# Patient Record
Sex: Male | Born: 1970
Health system: Southern US, Community
[De-identification: ages and names within clinical notes are randomized; demographics above are authoritative.]

## PROBLEM LIST (undated history)

## (undated) DIAGNOSIS — H538 Other visual disturbances: Secondary | ICD-10-CM

## (undated) DIAGNOSIS — K219 Gastro-esophageal reflux disease without esophagitis: Secondary | ICD-10-CM

## (undated) DIAGNOSIS — I10 Essential (primary) hypertension: Secondary | ICD-10-CM

## (undated) DIAGNOSIS — G43009 Migraine without aura, not intractable, without status migrainosus: Secondary | ICD-10-CM

## (undated) DIAGNOSIS — F32A Depression, unspecified: Secondary | ICD-10-CM

## (undated) DIAGNOSIS — R519 Headache, unspecified: Secondary | ICD-10-CM

## (undated) DIAGNOSIS — R509 Fever, unspecified: Secondary | ICD-10-CM

## (undated) DIAGNOSIS — F259 Schizoaffective disorder, unspecified: Secondary | ICD-10-CM

## (undated) DIAGNOSIS — I639 Cerebral infarction, unspecified: Secondary | ICD-10-CM

## (undated) DIAGNOSIS — F329 Major depressive disorder, single episode, unspecified: Secondary | ICD-10-CM

## (undated) DIAGNOSIS — R51 Headache: Secondary | ICD-10-CM

## (undated) DIAGNOSIS — F23 Brief psychotic disorder: Secondary | ICD-10-CM

## (undated) DIAGNOSIS — J449 Chronic obstructive pulmonary disease, unspecified: Secondary | ICD-10-CM

## (undated) DIAGNOSIS — F319 Bipolar disorder, unspecified: Secondary | ICD-10-CM

## (undated) DIAGNOSIS — R9431 Abnormal electrocardiogram [ECG] [EKG]: Secondary | ICD-10-CM

## (undated) HISTORY — DX: Chronic obstructive pulmonary disease, unspecified: J44.9

## (undated) HISTORY — DX: Cerebral infarction, unspecified: I63.9

## (undated) HISTORY — DX: Fever, unspecified: R50.9

## (undated) HISTORY — DX: Other visual disturbances: H53.8

## (undated) HISTORY — DX: Depression, unspecified: F32.A

## (undated) HISTORY — DX: Abnormal electrocardiogram (ECG) (EKG): R94.31

## (undated) HISTORY — DX: Essential (primary) hypertension: I10

## (undated) HISTORY — DX: Headache: R51

## (undated) HISTORY — DX: Headache, unspecified: R51.9

## (undated) HISTORY — PX: CYST REMOVAL HAND: SHX6279

## (undated) HISTORY — DX: Brief psychotic disorder: F23

## (undated) HISTORY — DX: Migraine without aura, not intractable, without status migrainosus: G43.009

## (undated) HISTORY — DX: Gastro-esophageal reflux disease without esophagitis: K21.9

---
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
## 2002-03-08 ENCOUNTER — Inpatient Hospital Stay (HOSPITAL_COMMUNITY): Admission: EM | Admit: 2002-03-08 | Discharge: 2002-03-12 | Payer: Self-pay | Admitting: Psychiatry

## 2007-07-26 ENCOUNTER — Ambulatory Visit: Payer: Self-pay | Admitting: *Deleted

## 2007-07-26 ENCOUNTER — Inpatient Hospital Stay (HOSPITAL_COMMUNITY): Admission: AD | Admit: 2007-07-26 | Discharge: 2007-07-30 | Payer: Self-pay | Admitting: *Deleted

## 2009-09-30 ENCOUNTER — Ambulatory Visit: Payer: Self-pay | Admitting: Psychiatry

## 2009-09-30 ENCOUNTER — Inpatient Hospital Stay (HOSPITAL_COMMUNITY): Admission: RE | Admit: 2009-09-30 | Discharge: 2009-10-02 | Payer: Self-pay | Admitting: Psychiatry

## 2010-03-18 LAB — COMPREHENSIVE METABOLIC PANEL
ALT: 100 U/L — ABNORMAL HIGH (ref 0–53)
AST: 57 U/L — ABNORMAL HIGH (ref 0–37)
Albumin: 3.5 g/dL (ref 3.5–5.2)
Alkaline Phosphatase: 81 U/L (ref 39–117)
BUN: 10 mg/dL (ref 6–23)
CO2: 26 mEq/L (ref 19–32)
Calcium: 8.7 mg/dL (ref 8.4–10.5)
Chloride: 106 mEq/L (ref 96–112)
Creatinine, Ser: 1.01 mg/dL (ref 0.4–1.5)
GFR calc Af Amer: >60 mL/min (ref >60)
GFR calc non Af Amer: >60 mL/min (ref >60)
Glucose, Bld: 94 mg/dL (ref 70–99)
Potassium: 3.9 mEq/L (ref 3.5–5.1)
Sodium: 139 mEq/L (ref 135–145)
Total Bilirubin: 0.5 mg/dL (ref 0.3–1.2)
Total Protein: 6.6 g/dL (ref 6.0–8.3)

## 2010-03-18 LAB — CBC
HCT: 40 % (ref 39.0–52.0)
Hemoglobin: 13.8 g/dL (ref 13.0–17.0)
MCH: 31.2 pg (ref 26.0–34.0)
MCHC: 34.6 g/dL (ref 30.0–36.0)
MCV: 90.2 fL (ref 78.0–100.0)
Platelets: 196 10*3/uL (ref 150–400)
RBC: 4.44 MIL/uL (ref 4.22–5.81)
RDW: 12.6 % (ref 11.5–15.5)
WBC: 9.2 10*3/uL (ref 4.0–10.5)
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
## 2010-05-21 NOTE — H&P (Signed)
Cameron Bryan, Cameron Bryan NO.:  [id number]   MEDICAL RECORD NO.:  [id number]                   PATIENT TYPE:  IPS   LOCATION:  0599                                 FACILITY:  [facility]   PHYSICIAN:  Geoffery Lyons, M.D.                   DATE OF BIRTH:  June 16, 1970   DATE OF ADMISSION:  03/08/2002  DATE OF DISCHARGE:                         PSYCHIATRIC ADMISSION ASSESSMENT   IDENTIFYING INFORMATION:  This is a 40 year old white married male, who was  admitted voluntarily.  Source of information is the patient and accompanying  records.   REASON FOR ADMISSION AND SYMPTOMS:  The patient says he wants to quit  drinking, I'm tired of it.  He has drank almost daily since age 78.  He is  not sure when chest/abdominal pain started.  He described a burn in the  substernal area and also a stabbing pain in his back.  He says, if he quit  cigarettes for a few days, it went away.  He does endorse depression.  He  states he does not sleep much.  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 at  night.  He reports mood swings where he becomes mean and irritable.  Apparently, his wife reports that he blacks out after drinking.  His wife  reports that he repeats himself, that he asks a lot of questions.   He was seen at the [facility] ER yesterday.  Apparently, he was given  lorazepam 2 mg at 6 p.m. #20 and, when he presented last night, he was down  to 11 or 12.  So there was also some concern that he might be abusing the  lorazepam.  Apparently, this was the first time he has been prescribed this  medication.   PAST PSYCHIATRIC HISTORY:  The patient denies.  He says his wife is bipolar  and takes medications.  He states that, in 17-May-1994, after his pap-paw died, he  began drinking heavily.  He has had three DUIs.  However, prior to this,  he  has had no formal psychiatric treatment.   SOCIAL HISTORY:  He does have a GED.  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  This is his second marriage.  He is into it for four  years.  His first marriage lasted about two years.  He has a daughter, age  7, a son, age 44, and a 32-year-old son today.  He also has a 88-year-old  stepson.   FAMILY HISTORY:  He says there is alcohol all over the place.  He does have  an uncle who killed someone by accident who has been sober for three years.   ALCOHOL/DRUG HISTORY:  He acknowledges alcohol  only, usually beer, 12 12-  ounce bottles a day.  He also acknowledges about 18 cigarettes a day for 17  years and he has chewed tobacco since he was age 40.   MEDICAL HISTORY AND PRIMARY CARE Nikaya Nasby:  His actual primary care [facility]  is Dr. Rutha Bouchard, although he has not seen Dr. Rutha Bouchard of late.  He was seen in  the occupational health department at [facility] by Dr. Lenise Arena, who  reinstated Ziac 5 mg p.o. q.d. for his hypertension.   ALLERGIES:  He has no known drug allergies.   REVIEW OF SYSTEMS:  Substernal burning chest pain that is unpredictable.  He  also reports stabbing pain between his shoulder blades at times.  He states  that, if he stops smoking for a couple of days, these symptoms subside.  He  has never had any treatment for reflux.  Beyond that, his review of systems  was negative.   PHYSICAL EXAMINATION:  GENERAL:  Well-developed, well-nourished, white male  in no acute distress.  VITAL SIGNS:  He is 5 feet 8 inches.  He weighs 170 pounds.  His blood  pressure was 141/87, 153/91, pulse 87, respirations 20 and temperature 98.7.  SKIN:  Intact with no remarkable findings.  HEENT:  Normocephalic.  PERRLA.  Thyroid within the midline.  Lymphadenopathy, no enlarged lymph nodes.  CHEST:  Clear to auscultation and percussion.  HEART:  Regular rate and rhythm without murmurs, rubs or gallops.  ABDOMEN:  Soft with no  organomegaly.  Bowel sounds are present in all four  quadrants.  MUSCULOSKELETAL:  No clubbing, cyanosis or edema.  No decreased range of  motion.  DTRs were 2+ and equal bilaterally.  NEUROLOGICAL:  Cranial nerves 2-12 are grossly intact.  Specifically, he had  no tremor from withdrawal.   MENTAL STATUS EXAM:  He is somewhat depressed and anxious.  He is neatly  dressed and groomed.  He is cooperative.  His speech had a normal rate,  rhythm and he had no pressured speech.  He denied suicidal or homicidal  ideation.  He denied auditory or visual hallucinations.  He does report mood  changes where he becomes quite irritable.  He is alert and oriented x 3.  Judgment and insight are fair.  He reports he has trouble with recent  memory, that his concentration was intact and his abstracting was concrete.  A mood disorder questionnaire was administered, which was grossly positive.  We will consider a diagnosis of bipolar disorder, type 2, based on this.   LABORATORY DATA:  Blood count showed hemoglobin of 14.7, hematocrit of 41.9,  white blood cell count 10.1 and platelets 199,000.  He had no abnormalities  on his chemistries, specifically his total bilirubin of 0.6, his alkaline  phosphatase was 69, SGOT 28, SGPT 40.  The remainder of his labs are  pending, specifically his TSH.   DIAGNOSES:   AXIS I:  1. Alcohol dependence.  2. Depressive disorder not otherwise specified.  3. Rule out bipolar disorder, type 2.   AXIS II:  Deferred.   AXIS III:  1. Hypertension.  2. Nicotine dependence.   AXIS IV:  Problems related to legal system.   AXIS V:  He is currently 79; in the past year 80-85.   PLAN:  Admit and help him through withdrawal.  Help him maintain sobriety so  he can regain his driver's license.  He is eligible to regain it in August.  Assess for reflux disease and treat  as indicated.     Mickie Adams- Deery, P.A.-C.              Geoffery Lyons, M.D.    MA/MEDQ  D:   03/09/2002  T:  03/09/2002  Job:  [id number]

## 2010-05-21 NOTE — Discharge Summary (Signed)
Cameron Bryan, Cameron Bryan NO.:  [id number]   MEDICAL RECORD NO.:  [id number]                   PATIENT TYPE:  IPS   LOCATION:  0502                                 [facility]ACILITY:  [facility]   PHYSICIAN:  Geoffery Lyons, M.D.                   DATE OF BIRTH:  [birth year]   DATE OF ADMISSION:  03/08/2002  DATE OF DISCHARGE:  03/12/2002                                 DISCHARGE SUMMARY   CHIEF COMPLAINT AND PRESENTING ILLNESS:  This was the first admission  to  [facility]  for this 40 year old white married male,  admitted voluntarily.  He claimed he wanted to quit drinking I'm tired of  it, drank almost daily since age 68, not sure when chest and abdominal  pains started.  Described a burn in the substernal area and also stabbing  pack in his back.  He quit cigarettes for a few days and it went away.  Endorsed depression, has not slept much.  He begins drinking after he comes  home from his job at [employer].  Drinks 12 12-ounce bottles of beer  until 9 p.m. at night.  Reported mood swings, becomes mean, irritable.  Apparently he blacks out after drinking.   PAST PSYCHIATRIC HISTORY:  He reports no psychiatric treatment.  Three DUIs.   FAMILY HISTORY:  Family history of alcohol dependency.   ALCOHOL AND DRUG HISTORY:  Acknowledges alcohol 12 12-ounce bottles of beer  a day.   PAST MEDICAL HISTORY:  Arterial hypertension.   MEDICATIONS:  Ziap 5 mg every day.   PHYSICAL EXAMINATION:  Performed, failed to show any acute findings.   MENTAL STATUS EXAM:  Reveals a somewhat depressed and anxious, neatly  dressed and groomed, cooperative.  Speech was normal rate, rhythm.  No  pressured speech.  Denies suicidal or homicidal ideas.  Denies auditory or  visual hallucinations.  Did report mood changes, becomes quite irritable.  Trouble with recent memory.   LABORATORY DATA:  Hemoglobin 14.7, hematocrit 41.9, bilirubin 0.6, alkaline  phosphatase  69, SGOT 28, SGPT 40.   ADMISSION DIAGNOSES:   AXIS I:  1. Alcohol dependence.  2. Depressive disorder not otherwise specified.   AXIS II:  No diagnosis.   AXIS III:  Hypertension.   AXIS IV:  Moderate.   AXIS V:  Global assessment of function upon admission 45, highest global  assessment of function in past year 80-85.   COURSE IN HOSPITAL:  He was admitted and started on intensive individual and  group psychotherapy.  He was detoxified using phenobarbital.  He was given  some trazodone for sleep and he continued to endorse depressed mood so he  was started on Wellbutrin.  He claimed to have been unable to maintain  abstinence, longest time of abstinence was 4 months.  When abstinent,  experienced depression.  He endorsed depressed mood,  sadness, decreased  energy, decreased motivation, feeling very overwhelmed.  Also complaining of  cravings.  We continued with detox protocol.  He started looking at the  triggers that precipitated his relapse.  On March 9 he was in full contact  with reality.  Mood was euthymic, affect was bright, broad.  No suicidal  ideas, no homicidal ideas, fully detoxed, increased insight in terms of the  need to abstain.  Continued to work on a recovery plan.   DISCHARGE DIAGNOSES:   AXIS I:  1. Alcohol dependence.  2. Depressive disorder not otherwise specified.   AXIS II:  No diagnosis.   AXIS III:  Arterial hypertension.   AXIS IV:  Moderate.   AXIS V:  Global assessment of function upon discharge 50.   DISCHARGE MEDICATIONS:  1. Trazodone 50 at bedtime for sleep.  2. Ziap 5-625 1 daily.  3. Wellbutrin XL 150 mg daily.   DISPOSITION:  Rule out at [facility].                                                Geoffery Lyons, M.D.    IL/MEDQ  D:  04/10/2002  T:  04/11/2002  Job:  [id number]

## 2010-10-01 LAB — DRUGS OF ABUSE SCREEN W/O ALC, ROUTINE URINE
Amphetamine Screen, Ur: NEGATIVE
Barbiturate Quant, Ur: NEGATIVE
Benzodiazepines.: NEGATIVE
Cocaine Metabolites: NEGATIVE
Creatinine,U: 142.1
Marijuana Metabolite: NEGATIVE
Methadone: NEGATIVE
Opiate Screen, Urine: NEGATIVE
Phencyclidine (PCP): NEGATIVE
Propoxyphene: NEGATIVE

## 2010-10-01 LAB — COMPREHENSIVE METABOLIC PANEL
ALT: 49
AST: 32
Albumin: 3.6
Alkaline Phosphatase: 83
BUN: 15
CO2: 31
Calcium: 9.2
Chloride: 103
Creatinine, Ser: 1.17
GFR calc Af Amer: >60
GFR calc non Af Amer: >60
Glucose, Bld: 136 — ABNORMAL HIGH
Potassium: 3.9
Sodium: 142
Total Bilirubin: 0.6
Total Protein: 6.3

## 2010-10-01 LAB — URINALYSIS, ROUTINE W REFLEX MICROSCOPIC
Bilirubin Urine: NEGATIVE
Glucose, UA: NEGATIVE
Hgb urine dipstick: NEGATIVE
Ketones, ur: NEGATIVE
Nitrite: NEGATIVE
Protein, ur: NEGATIVE
Specific Gravity, Urine: 1.02
Urobilinogen, UA: 0.2
pH: 5.5

## 2010-10-01 LAB — MAGNESIUM: Magnesium: 2.3

## 2010-10-01 LAB — CBC
HCT: 41.9
Hemoglobin: 14.5
MCHC: 34.5
MCV: 89.9
Platelets: 209
RBC: 4.66
RDW: 11.1 — ABNORMAL LOW
WBC: 10.4

## 2010-10-01 LAB — TSH: TSH: 1.911

## 2012-01-11 DIAGNOSIS — I10 Essential (primary) hypertension: Secondary | ICD-10-CM | POA: Diagnosis not present

## 2012-01-11 DIAGNOSIS — R9431 Abnormal electrocardiogram [ECG] [EKG]: Secondary | ICD-10-CM | POA: Diagnosis not present

## 2012-01-11 DIAGNOSIS — R079 Chest pain, unspecified: Secondary | ICD-10-CM | POA: Diagnosis not present

## 2012-01-11 DIAGNOSIS — R0602 Shortness of breath: Secondary | ICD-10-CM | POA: Diagnosis not present

## 2012-01-12 DIAGNOSIS — R9431 Abnormal electrocardiogram [ECG] [EKG]: Secondary | ICD-10-CM | POA: Diagnosis not present

## 2012-01-13 DIAGNOSIS — R0789 Other chest pain: Secondary | ICD-10-CM | POA: Diagnosis not present

## 2012-10-29 DIAGNOSIS — K700 Alcoholic fatty liver: Secondary | ICD-10-CM | POA: Diagnosis not present

## 2013-02-18 DIAGNOSIS — F259 Schizoaffective disorder, unspecified: Secondary | ICD-10-CM | POA: Diagnosis not present

## 2013-03-05 DIAGNOSIS — F17200 Nicotine dependence, unspecified, uncomplicated: Secondary | ICD-10-CM | POA: Diagnosis not present

## 2013-03-05 DIAGNOSIS — R5383 Other fatigue: Secondary | ICD-10-CM | POA: Diagnosis not present

## 2013-03-05 DIAGNOSIS — G4710 Hypersomnia, unspecified: Secondary | ICD-10-CM | POA: Diagnosis not present

## 2013-03-05 DIAGNOSIS — R5381 Other malaise: Secondary | ICD-10-CM | POA: Diagnosis not present

## 2013-03-05 DIAGNOSIS — G4730 Sleep apnea, unspecified: Secondary | ICD-10-CM | POA: Diagnosis not present

## 2013-03-28 DIAGNOSIS — F259 Schizoaffective disorder, unspecified: Secondary | ICD-10-CM | POA: Diagnosis not present

## 2013-03-28 DIAGNOSIS — F411 Generalized anxiety disorder: Secondary | ICD-10-CM | POA: Diagnosis not present

## 2013-03-28 DIAGNOSIS — F322 Major depressive disorder, single episode, severe without psychotic features: Secondary | ICD-10-CM | POA: Diagnosis not present

## 2013-04-10 DIAGNOSIS — J329 Chronic sinusitis, unspecified: Secondary | ICD-10-CM | POA: Diagnosis not present

## 2013-04-19 DIAGNOSIS — F411 Generalized anxiety disorder: Secondary | ICD-10-CM | POA: Diagnosis not present

## 2013-04-19 DIAGNOSIS — F259 Schizoaffective disorder, unspecified: Secondary | ICD-10-CM | POA: Diagnosis not present

## 2013-04-19 DIAGNOSIS — F322 Major depressive disorder, single episode, severe without psychotic features: Secondary | ICD-10-CM | POA: Diagnosis not present

## 2013-04-30 DIAGNOSIS — F411 Generalized anxiety disorder: Secondary | ICD-10-CM | POA: Diagnosis not present

## 2013-04-30 DIAGNOSIS — F259 Schizoaffective disorder, unspecified: Secondary | ICD-10-CM | POA: Diagnosis not present

## 2013-04-30 DIAGNOSIS — F322 Major depressive disorder, single episode, severe without psychotic features: Secondary | ICD-10-CM | POA: Diagnosis not present

## 2013-05-06 DIAGNOSIS — F259 Schizoaffective disorder, unspecified: Secondary | ICD-10-CM | POA: Diagnosis not present

## 2013-05-13 DIAGNOSIS — F411 Generalized anxiety disorder: Secondary | ICD-10-CM | POA: Diagnosis not present

## 2013-05-13 DIAGNOSIS — F322 Major depressive disorder, single episode, severe without psychotic features: Secondary | ICD-10-CM | POA: Diagnosis not present

## 2013-05-13 DIAGNOSIS — F259 Schizoaffective disorder, unspecified: Secondary | ICD-10-CM | POA: Diagnosis not present

## 2013-05-29 DIAGNOSIS — F322 Major depressive disorder, single episode, severe without psychotic features: Secondary | ICD-10-CM | POA: Diagnosis not present

## 2013-05-29 DIAGNOSIS — F411 Generalized anxiety disorder: Secondary | ICD-10-CM | POA: Diagnosis not present

## 2013-05-29 DIAGNOSIS — F259 Schizoaffective disorder, unspecified: Secondary | ICD-10-CM | POA: Diagnosis not present

## 2013-06-04 DIAGNOSIS — F259 Schizoaffective disorder, unspecified: Secondary | ICD-10-CM | POA: Diagnosis not present

## 2013-06-18 DIAGNOSIS — F322 Major depressive disorder, single episode, severe without psychotic features: Secondary | ICD-10-CM | POA: Diagnosis not present

## 2013-06-18 DIAGNOSIS — F259 Schizoaffective disorder, unspecified: Secondary | ICD-10-CM | POA: Diagnosis not present

## 2013-06-18 DIAGNOSIS — F411 Generalized anxiety disorder: Secondary | ICD-10-CM | POA: Diagnosis not present

## 2013-07-09 DIAGNOSIS — F411 Generalized anxiety disorder: Secondary | ICD-10-CM | POA: Diagnosis not present

## 2013-07-09 DIAGNOSIS — F259 Schizoaffective disorder, unspecified: Secondary | ICD-10-CM | POA: Diagnosis not present

## 2013-07-09 DIAGNOSIS — F322 Major depressive disorder, single episode, severe without psychotic features: Secondary | ICD-10-CM | POA: Diagnosis not present

## 2013-07-29 DIAGNOSIS — F259 Schizoaffective disorder, unspecified: Secondary | ICD-10-CM | POA: Diagnosis not present

## 2013-08-08 DIAGNOSIS — I10 Essential (primary) hypertension: Secondary | ICD-10-CM | POA: Diagnosis not present

## 2013-08-08 DIAGNOSIS — E119 Type 2 diabetes mellitus without complications: Secondary | ICD-10-CM | POA: Diagnosis not present

## 2013-08-08 DIAGNOSIS — E785 Hyperlipidemia, unspecified: Secondary | ICD-10-CM | POA: Diagnosis not present

## 2013-09-03 DIAGNOSIS — F259 Schizoaffective disorder, unspecified: Secondary | ICD-10-CM | POA: Diagnosis not present

## 2013-09-03 DIAGNOSIS — F411 Generalized anxiety disorder: Secondary | ICD-10-CM | POA: Diagnosis not present

## 2013-09-03 DIAGNOSIS — F322 Major depressive disorder, single episode, severe without psychotic features: Secondary | ICD-10-CM | POA: Diagnosis not present

## 2013-09-05 DIAGNOSIS — K7010 Alcoholic hepatitis without ascites: Secondary | ICD-10-CM | POA: Diagnosis not present

## 2013-09-05 DIAGNOSIS — R1084 Generalized abdominal pain: Secondary | ICD-10-CM | POA: Diagnosis not present

## 2013-09-12 DIAGNOSIS — K824 Cholesterolosis of gallbladder: Secondary | ICD-10-CM | POA: Diagnosis not present

## 2013-09-20 DIAGNOSIS — R1084 Generalized abdominal pain: Secondary | ICD-10-CM | POA: Diagnosis not present

## 2013-09-20 DIAGNOSIS — K2900 Acute gastritis without bleeding: Secondary | ICD-10-CM | POA: Diagnosis not present

## 2013-09-20 DIAGNOSIS — K2901 Acute gastritis with bleeding: Secondary | ICD-10-CM | POA: Diagnosis not present

## 2013-09-24 DIAGNOSIS — F411 Generalized anxiety disorder: Secondary | ICD-10-CM | POA: Diagnosis not present

## 2013-09-24 DIAGNOSIS — F259 Schizoaffective disorder, unspecified: Secondary | ICD-10-CM | POA: Diagnosis not present

## 2013-09-24 DIAGNOSIS — F322 Major depressive disorder, single episode, severe without psychotic features: Secondary | ICD-10-CM | POA: Diagnosis not present

## 2013-10-02 DIAGNOSIS — J9819 Other pulmonary collapse: Secondary | ICD-10-CM | POA: Diagnosis not present

## 2013-10-02 DIAGNOSIS — R079 Chest pain, unspecified: Secondary | ICD-10-CM | POA: Diagnosis not present

## 2013-10-02 DIAGNOSIS — T424X4A Poisoning by benzodiazepines, undetermined, initial encounter: Secondary | ICD-10-CM | POA: Diagnosis not present

## 2013-10-02 DIAGNOSIS — T424X1A Poisoning by benzodiazepines, accidental (unintentional), initial encounter: Secondary | ICD-10-CM | POA: Diagnosis not present

## 2013-10-02 DIAGNOSIS — J984 Other disorders of lung: Secondary | ICD-10-CM | POA: Diagnosis not present

## 2013-10-15 DIAGNOSIS — F250 Schizoaffective disorder, bipolar type: Secondary | ICD-10-CM | POA: Diagnosis not present

## 2013-10-15 DIAGNOSIS — F411 Generalized anxiety disorder: Secondary | ICD-10-CM | POA: Diagnosis not present

## 2013-10-27 DIAGNOSIS — Z720 Tobacco use: Secondary | ICD-10-CM | POA: Diagnosis not present

## 2013-10-27 DIAGNOSIS — W172XXA Fall into hole, initial encounter: Secondary | ICD-10-CM | POA: Diagnosis not present

## 2013-10-27 DIAGNOSIS — S80211A Abrasion, right knee, initial encounter: Secondary | ICD-10-CM | POA: Diagnosis not present

## 2013-10-27 DIAGNOSIS — S92355A Nondisplaced fracture of fifth metatarsal bone, left foot, initial encounter for closed fracture: Secondary | ICD-10-CM | POA: Diagnosis not present

## 2013-10-27 DIAGNOSIS — I10 Essential (primary) hypertension: Secondary | ICD-10-CM | POA: Diagnosis not present

## 2013-10-27 DIAGNOSIS — S92302A Fracture of unspecified metatarsal bone(s), left foot, initial encounter for closed fracture: Secondary | ICD-10-CM | POA: Diagnosis not present

## 2013-10-27 DIAGNOSIS — Z79899 Other long term (current) drug therapy: Secondary | ICD-10-CM | POA: Diagnosis not present

## 2013-10-27 DIAGNOSIS — S92352A Displaced fracture of fifth metatarsal bone, left foot, initial encounter for closed fracture: Secondary | ICD-10-CM | POA: Diagnosis not present

## 2013-10-31 DIAGNOSIS — S92352A Displaced fracture of fifth metatarsal bone, left foot, initial encounter for closed fracture: Secondary | ICD-10-CM | POA: Diagnosis not present

## 2013-11-04 DIAGNOSIS — F251 Schizoaffective disorder, depressive type: Secondary | ICD-10-CM | POA: Diagnosis not present

## 2013-11-13 DIAGNOSIS — R1011 Right upper quadrant pain: Secondary | ICD-10-CM | POA: Diagnosis not present

## 2013-11-13 DIAGNOSIS — K7010 Alcoholic hepatitis without ascites: Secondary | ICD-10-CM | POA: Diagnosis not present

## 2013-11-25 DIAGNOSIS — F250 Schizoaffective disorder, bipolar type: Secondary | ICD-10-CM | POA: Diagnosis not present

## 2013-11-25 DIAGNOSIS — F411 Generalized anxiety disorder: Secondary | ICD-10-CM | POA: Diagnosis not present

## 2013-12-16 DIAGNOSIS — F251 Schizoaffective disorder, depressive type: Secondary | ICD-10-CM | POA: Diagnosis not present

## 2013-12-18 DIAGNOSIS — F250 Schizoaffective disorder, bipolar type: Secondary | ICD-10-CM | POA: Diagnosis not present

## 2013-12-18 DIAGNOSIS — F411 Generalized anxiety disorder: Secondary | ICD-10-CM | POA: Diagnosis not present

## 2014-01-08 DIAGNOSIS — F250 Schizoaffective disorder, bipolar type: Secondary | ICD-10-CM | POA: Diagnosis not present

## 2014-01-08 DIAGNOSIS — F411 Generalized anxiety disorder: Secondary | ICD-10-CM | POA: Diagnosis not present

## 2014-01-22 DIAGNOSIS — F250 Schizoaffective disorder, bipolar type: Secondary | ICD-10-CM | POA: Diagnosis not present

## 2014-01-22 DIAGNOSIS — F411 Generalized anxiety disorder: Secondary | ICD-10-CM | POA: Diagnosis not present

## 2014-02-13 DIAGNOSIS — F411 Generalized anxiety disorder: Secondary | ICD-10-CM | POA: Diagnosis not present

## 2014-02-13 DIAGNOSIS — F250 Schizoaffective disorder, bipolar type: Secondary | ICD-10-CM | POA: Diagnosis not present

## 2014-02-21 DIAGNOSIS — F411 Generalized anxiety disorder: Secondary | ICD-10-CM | POA: Diagnosis not present

## 2014-02-21 DIAGNOSIS — F250 Schizoaffective disorder, bipolar type: Secondary | ICD-10-CM | POA: Diagnosis not present

## 2014-02-28 DIAGNOSIS — K7010 Alcoholic hepatitis without ascites: Secondary | ICD-10-CM | POA: Diagnosis not present

## 2014-06-05 ENCOUNTER — Emergency Department (HOSPITAL_COMMUNITY): Payer: Commercial Managed Care - HMO

## 2014-06-05 ENCOUNTER — Encounter (HOSPITAL_COMMUNITY): Payer: Self-pay | Admitting: Emergency Medicine

## 2014-06-05 ENCOUNTER — Observation Stay (HOSPITAL_COMMUNITY): Payer: Commercial Managed Care - HMO

## 2014-06-05 ENCOUNTER — Observation Stay (HOSPITAL_COMMUNITY)
Admission: EM | Admit: 2014-06-05 | Discharge: 2014-06-06 | Disposition: A | Discharge status: Home / Self Care | Payer: Commercial Managed Care - HMO | Attending: Internal Medicine | Admitting: Internal Medicine

## 2014-06-05 DIAGNOSIS — Z888 Allergy status to other drugs, medicaments and biological substances status: Secondary | ICD-10-CM | POA: Diagnosis not present

## 2014-06-05 DIAGNOSIS — R51 Headache: Secondary | ICD-10-CM | POA: Diagnosis not present

## 2014-06-05 DIAGNOSIS — R0789 Other chest pain: Secondary | ICD-10-CM

## 2014-06-05 DIAGNOSIS — R079 Chest pain, unspecified: Secondary | ICD-10-CM

## 2014-06-05 DIAGNOSIS — H538 Other visual disturbances: Secondary | ICD-10-CM | POA: Diagnosis not present

## 2014-06-05 DIAGNOSIS — Z7982 Long term (current) use of aspirin: Secondary | ICD-10-CM | POA: Diagnosis not present

## 2014-06-05 DIAGNOSIS — H539 Unspecified visual disturbance: Secondary | ICD-10-CM

## 2014-06-05 DIAGNOSIS — R9431 Abnormal electrocardiogram [ECG] [EKG]: Secondary | ICD-10-CM | POA: Diagnosis not present

## 2014-06-05 DIAGNOSIS — F319 Bipolar disorder, unspecified: Secondary | ICD-10-CM | POA: Diagnosis not present

## 2014-06-05 DIAGNOSIS — I10 Essential (primary) hypertension: Secondary | ICD-10-CM | POA: Diagnosis not present

## 2014-06-05 DIAGNOSIS — F259 Schizoaffective disorder, unspecified: Secondary | ICD-10-CM | POA: Diagnosis present

## 2014-06-05 DIAGNOSIS — R519 Headache, unspecified: Secondary | ICD-10-CM | POA: Diagnosis present

## 2014-06-05 DIAGNOSIS — F17210 Nicotine dependence, cigarettes, uncomplicated: Secondary | ICD-10-CM | POA: Diagnosis not present

## 2014-06-05 HISTORY — DX: Schizoaffective disorder, unspecified: F25.9

## 2014-06-05 HISTORY — DX: Essential (primary) hypertension: I10

## 2014-06-05 HISTORY — DX: Abnormal electrocardiogram (ECG) (EKG): R94.31

## 2014-06-05 HISTORY — DX: Unspecified visual disturbance: H53.9

## 2014-06-05 HISTORY — DX: Chest pain, unspecified: R07.9

## 2014-06-05 HISTORY — DX: Bipolar disorder, unspecified: F31.9

## 2014-06-05 LAB — BASIC METABOLIC PANEL
Anion gap: 8 (ref 5–15)
BUN: 10 mg/dL (ref 6–20)
CO2: 27 mmol/L (ref 22–32)
Calcium: 9.2 mg/dL (ref 8.9–10.3)
Chloride: 103 mmol/L (ref 101–111)
Creatinine, Ser: 0.95 mg/dL (ref 0.61–1.24)
GFR calc Af Amer: >60 mL/min (ref >60)
GFR calc non Af Amer: >60 mL/min (ref >60)
Glucose, Bld: 151 mg/dL — ABNORMAL HIGH (ref 65–99)
Potassium: 3.2 mmol/L — ABNORMAL LOW (ref 3.5–5.1)
Sodium: 138 mmol/L (ref 135–145)

## 2014-06-05 LAB — I-STAT TROPONIN, ED: Troponin i, poc: 0.01 ng/mL (ref 0.00–0.08)

## 2014-06-05 LAB — CBC
HCT: 40.3 % (ref 39.0–52.0)
Hemoglobin: 14.3 g/dL (ref 13.0–17.0)
MCH: 30.2 pg (ref 26.0–34.0)
MCHC: 35.5 g/dL (ref 30.0–36.0)
MCV: 85 fL (ref 78.0–100.0)
Platelets: 295 10*3/uL (ref 150–400)
RBC: 4.74 MIL/uL (ref 4.22–5.81)
RDW: 12.3 % (ref 11.5–15.5)
WBC: 11.7 10*3/uL — ABNORMAL HIGH (ref 4.0–10.5)

## 2014-06-05 LAB — BRAIN NATRIURETIC PEPTIDE: B Natriuretic Peptide: 37.1 pg/mL (ref 0.0–100.0)

## 2014-06-05 MED ORDER — DIPHENHYDRAMINE HCL 12.5 MG/5ML PO ELIX
12.5000 mg | ORAL_SOLUTION | Freq: Four times a day (QID) | ORAL | Status: DC
  Administered 2014-06-06: 12.5 mg via ORAL
  Filled 2014-06-05 (×6): qty 5

## 2014-06-05 MED ORDER — PROCHLORPERAZINE EDISYLATE 5 MG/ML IJ SOLN
10.0000 mg | Freq: Four times a day (QID) | INTRAMUSCULAR | Status: DC
  Administered 2014-06-06: 10 mg via INTRAVENOUS
  Filled 2014-06-05 (×4): qty 2

## 2014-06-05 MED ORDER — KETOROLAC TROMETHAMINE 30 MG/ML IJ SOLN
30.0000 mg | Freq: Once | INTRAMUSCULAR | Status: AC
  Administered 2014-06-06: 30 mg via INTRAVENOUS
  Filled 2014-06-05: qty 1

## 2014-06-05 MED ORDER — MAGNESIUM SULFATE 2 GM/50ML IV SOLN
2.0000 g | Freq: Once | INTRAVENOUS | Status: AC
  Administered 2014-06-06: 2 g via INTRAVENOUS
  Filled 2014-06-05: qty 50

## 2014-06-05 MED ORDER — TETRACAINE HCL 0.5 % OP SOLN
1.0000 [drp] | Freq: Once | OPHTHALMIC | Status: DC
  Filled 2014-06-05: qty 2

## 2014-06-05 MED ORDER — ASPIRIN 81 MG PO CHEW
324.0000 mg | CHEWABLE_TABLET | Freq: Once | ORAL | Status: AC
  Administered 2014-06-05: 324 mg via ORAL
  Filled 2014-06-05: qty 4

## 2014-06-05 MED ORDER — SODIUM CHLORIDE 0.9 % IV BOLUS (SEPSIS)
1000.0000 mL | Freq: Once | INTRAVENOUS | Status: AC
  Administered 2014-06-05: 1000 mL via INTRAVENOUS

## 2014-06-05 MED ORDER — DIPHENHYDRAMINE HCL 50 MG/ML IJ SOLN
12.5000 mg | Freq: Once | INTRAMUSCULAR | Status: AC
  Administered 2014-06-05: 12.5 mg via INTRAVENOUS
  Filled 2014-06-05: qty 1

## 2014-06-05 MED ORDER — VALPROATE SODIUM 500 MG/5ML IV SOLN
1.0000 g | Freq: Once | INTRAVENOUS | Status: AC
  Administered 2014-06-06: 1000 mg via INTRAVENOUS
  Filled 2014-06-05 (×2): qty 10

## 2014-06-05 MED ORDER — PROCHLORPERAZINE EDISYLATE 5 MG/ML IJ SOLN
10.0000 mg | Freq: Once | INTRAMUSCULAR | Status: AC
  Administered 2014-06-05: 10 mg via INTRAVENOUS
  Filled 2014-06-05: qty 2

## 2014-06-05 NOTE — ED Notes (Signed)
[facility] at bedside.  

## 2014-06-05 NOTE — ED Notes (Signed)
Neurology at bedside.

## 2014-06-05 NOTE — ED Provider Notes (Signed)
CSN: [id number]     Arrival date & time 06/05/14  1546 History   First MD Initiated Contact with Patient 06/05/14 1807     Chief Complaint  Patient presents with  . Chest Pain  . Headache  . Blurred Vision     (Consider location/radiation/quality/duration/timing/severity/associated sxs/prior Treatment) The history is provided by the patient and medical records. No language interpreter was used.     Cameron Bryan is a 44 y.o. male  with a hx of HTN, bipolar and schizoaffective disorder presents to the Emergency Department complaining of gradual, persistent, progressively worsening headache, visual changes and chest pain onset over the last 5 days.  Pt reports he was placed on Chantix on 05/22/14.  5 days ago he developed a generalized headache, worse around his eyes as the headache progressed he developed blurry vision beginning on 3 days ago which has persistent with the gradually worsening headache.  Pt reports that yesterday he developed central chest heaviness. He reports he discussed this with his MD who recommended that he stop the Chantix and he was seen in the office this afternoon.  After the visit, he was sent here to the ED.  Pt denies a cardiac hx.  He reports a Hx of alcoholism and has been sober for 120 days.  He reports feeling generally ill, but is unable to be more specific than that. Pt denies pain in his chest or back, SOB, abd pain.  He reports 3 episodes of emesis today that was NBNB. Nothing makes his symptoms better or worse.  He denies fever, chills, neck pain, neck stiffness, abdominal pain, diarrhea, syncope, dysuria, hematuria.  Pt reports recent nuclear stress test and Echo which were normal.  I cannot find this in the EMR.  He denies previous cardiac cath.     Past Medical History  Diagnosis Date  . Hypertension   . Bipolar 1 disorder   . Schizoaffective disorder    History reviewed. No pertinent past surgical history. No family history on file. History  Substance  Use Topics  . Smoking status: Current Every Day Smoker  . Smokeless tobacco: Not on file  . Alcohol Use: No    Review of Systems  Constitutional: Positive for fatigue. Negative for fever, diaphoresis, appetite change and unexpected weight change.  HENT: Negative for mouth sores.   Eyes: Positive for visual disturbance.  Respiratory: Negative for cough, chest tightness, shortness of breath and wheezing.   Cardiovascular: Positive for chest pain (chest heaviness).  Gastrointestinal: Positive for nausea and vomiting. Negative for abdominal pain, diarrhea and constipation.  Endocrine: Negative for polydipsia, polyphagia and polyuria.  Genitourinary: Negative for dysuria, urgency, frequency and hematuria.  Musculoskeletal: Negative for back pain and neck stiffness.  Skin: Negative for rash.  Allergic/Immunologic: Negative for immunocompromised state.  Neurological: Positive for headaches. Negative for syncope and light-headedness.  Hematological: Does not bruise/bleed easily.  Psychiatric/Behavioral: Negative for sleep disturbance. The patient is not nervous/anxious.       Allergies  Oxycodone  Home Medications   Prior to Admission medications   Medication Sig Start Date End Date Taking? Authorizing Provider  amLODipine (NORVASC) 10 MG tablet Take 10 mg by mouth daily.   Yes Historical Provider, MD  olmesartan-hydrochlorothiazide (BENICAR HCT) 40-25 MG per tablet Take 1 tablet by mouth daily.   Yes Historical Provider, MD  pantoprazole (PROTONIX) 40 MG tablet Take 40 mg by mouth daily.   Yes Historical Provider, MD   BP 119/67 mmHg  Pulse 82  Temp(Src)  98.9 F (37.2 C) (Oral)  Resp 16  SpO2 97% Physical Exam  Constitutional: He is oriented to person, place, and time. He appears well-developed and well-nourished. No distress.  HENT:  Head: Normocephalic and atraumatic.  Nose: Nose normal. No mucosal edema or rhinorrhea.  Mouth/Throat: Uvula is midline, oropharynx is clear and  moist and mucous membranes are normal. No uvula swelling. No oropharyngeal exudate, posterior oropharyngeal edema, posterior oropharyngeal erythema or tonsillar abscesses.  Eyes: Conjunctivae, EOM and lids are normal. Pupils are equal, round, and reactive to light. Lids are everted and swept, no foreign bodies found. Right eye exhibits no chemosis, no discharge and no exudate. No foreign body present in the right eye. Left eye exhibits no chemosis, no discharge and no exudate. No foreign body present in the left eye. Right conjunctiva is not injected. Right conjunctiva has no hemorrhage. Left conjunctiva is not injected. Left conjunctiva has no hemorrhage. No scleral icterus.  Slit lamp exam:      The right eye shows no corneal abrasion, no corneal flare, no corneal ulcer, no foreign body, no fluorescein uptake and no anterior chamber bulge.       The left eye shows no corneal abrasion, no corneal flare, no corneal ulcer, no foreign body, no fluorescein uptake and no anterior chamber bulge.  Pupils equal round and reactive to light No vertical, horizontal or rotational nystagmus No visible foreign body No corneal flare, ulcer or dendritic staining  No herpetic lesions to the face or around the eye  Tono:  R: 20 L:24  Visual Acuity: Bilateral Near: 20/100 ;  R Near: 20/400 ;  L Near: 20/200  Neck: Normal range of motion. Neck supple.  Full active and passive ROM without pain No midline or paraspinal tenderness No nuchal rigidity or meningeal signs  Cardiovascular: Normal rate, regular rhythm, normal heart sounds and intact distal pulses.   No murmur heard. Pulmonary/Chest: Effort normal and breath sounds normal. No respiratory distress. He has no wheezes. He has no rales.  Abdominal: Soft. Bowel sounds are normal. There is no tenderness. There is no rebound and no guarding.  Musculoskeletal: Normal range of motion. He exhibits no edema.  Lymphadenopathy:    He has no cervical adenopathy.   Neurological: He is alert and oriented to person, place, and time. He has normal reflexes. No cranial nerve deficit. He exhibits normal muscle tone. Coordination normal.  Mental Status:  Alert, oriented, thought content appropriate. Speech fluent without evidence of aphasia. Able to follow 2 step commands without difficulty.  Cranial Nerves:  II:  Peripheral visual fields grossly normal, pupils equal, round, reactive to light III,IV, VI: ptosis not present, extra-ocular motions intact bilaterally  V,VII: smile symmetric, facial light touch sensation equal VIII: hearing grossly normal bilaterally  IX,X: gag reflex present  XI: bilateral shoulder shrug equal and strong XII: midline tongue extension  Motor:  5/5 in upper and lower extremities bilaterally including strong and equal grip strength and dorsiflexion/plantar flexion Sensory: Pinprick and light touch normal in all extremities.  Deep Tendon Reflexes: 2+ and symmetric  Cerebellar: normal finger-to-nose with bilateral upper extremities Gait: gait testing deferred CV: distal pulses palpable throughout   Skin: Skin is warm and dry. No rash noted. He is not diaphoretic. No erythema.  Psychiatric: He has a normal mood and affect. His behavior is normal. Judgment and thought content normal.  Nursing note and vitals reviewed.   ED Course  Procedures (including critical care time) Labs Review Labs Reviewed  CBC -  Abnormal; Notable for the following:    WBC 11.7 (*)    All other components within normal limits  BASIC METABOLIC PANEL - Abnormal; Notable for the following:    Potassium 3.2 (*)    Glucose, Bld 151 (*)    All other components within normal limits  BRAIN NATRIURETIC PEPTIDE  I-STAT TROPOININ, ED    Imaging Review Dg Chest 2 View  06/05/2014   CLINICAL DATA:  Chest pain for 2 days.  Smoker  EXAM: CHEST  2 VIEW  COMPARISON:  02/03/2014  FINDINGS: The heart size and mediastinal contours are within normal limits. Both  lungs are clear. The visualized skeletal structures are unremarkable.  IMPRESSION: No active cardiopulmonary disease.   Electronically Signed   By: Signa Kell M.D.   On: 06/05/2014 16:56   Ct Head Wo Contrast  06/05/2014   CLINICAL DATA:  Headache started on Saturday, chest pain starting on Tuesday. Blurred vision and pain in the eyes. Nausea and vomiting.  EXAM: CT HEAD WITHOUT CONTRAST  TECHNIQUE: Contiguous axial images were obtained from the base of the skull through the vertex without intravenous contrast.  COMPARISON:  02/03/2014  FINDINGS: Mild chronic anterior ethmoid sinusitis. No orbital abnormality identified.  The brainstem, cerebellum, cerebral peduncles, thalamus, basal ganglia, basilar cisterns, and ventricular system appear within normal limits. No intracranial hemorrhage, mass lesion, or acute CVA.  IMPRESSION: 1. No significant intracranial abnormality identified. 2. Mild chronic anterior ethmoid sinusitis.   Electronically Signed   By: Gaylyn Rong M.D.   On: 06/05/2014 20:08   [name] Contrast  06/05/2014   CLINICAL DATA:  Initial evaluation for acute headache, blurry vision.  EXAM: MRI HEAD WITHOUT CONTRAST  TECHNIQUE: Multiplanar, multiecho pulse sequences of the brain and surrounding structures were obtained without intravenous contrast.  COMPARISON:  Prior CT from earlier the same day.  FINDINGS: The CSF containing spaces are within normal limits for patient age. No focal parenchymal signal abnormality is identified. No mass lesion, midline shift, or extra-axial fluid collection. Ventricles are normal in size without evidence of hydrocephalus.  No diffusion-weighted signal abnormality is identified to suggest acute intracranial infarct. Gray-white matter differentiation is maintained. Normal flow voids are seen within the intracranial vasculature. No intracranial hemorrhage identified.  The cervicomedullary junction is normal. Pituitary gland is within normal limits.  Pituitary stalk is midline. The globes and optic nerves demonstrate a normal appearance with normal signal intensity.  The bone marrow signal intensity is normal. Calvarium is intact. Visualized upper cervical spine is within normal limits.  Scalp soft tissues are unremarkable.  Paranasal sinuses are clear.  No mastoid effusion.  IMPRESSION: Normal MRI of the brain with no acute intracranial process identified.   Electronically Signed   By: Rise Mu M.D.   On: 06/05/2014 23:12     EKG Interpretation   Date/Time:  Thursday June 05 2014 16:12:31 EDT Ventricular Rate:  96 PR Interval:  142 QRS Duration: 90 QT Interval:  354 QTC Calculation: 447 R Axis:   56 Text Interpretation:  Normal sinus rhythm ST \\T \ T wave abnormality,  consider inferolateral ischemia Abnormal ECG agree. abnormal Confirmed by  Donnald Garre, MD, Lebron Conners [phone]) on 06/05/2014 7:25:13 PM      MDM   Final diagnoses:  Chest pressure  Blurred vision  Acute intractable headache, unspecified headache type  Abnormal ECG   [name] presents with multiple complaints that have been developing over the last  5 days.  Patient with headache, blurred vision  that has been persistent.  Patient now with 24 hours of chest pain.  EKG with inferior lateral ischemic changes. No old for comparison.    Months with negative troponin, mild leukocytosis, mild hypokalemia, normal BNP. Will obtain head CT. Chest x-rays without acute abnormality.  8:30 PM CT head with no acute abnormalities. No CVA identified.  Pt given ASA and headache control. Will evaluate eyes for increased IOP and plan for admission.  Pt ambulates without difficulty or focal deficit.   10:01 PM Slightly increased IOP as noted in the physical exam.  Pt reports resolution of chest pressure and improvement of headache, but without resolution of headache.    BP 139/92 mmHg  Pulse 81  Temp(Src) 98.9 F (37.2 C) (Oral)  Resp 18  SpO2 96%  10:08 PM Pt discussed  with Dr. Onalee Huaavid who will admit to tele and I will discuss with neurology for consult.    Dahlia ClientHannah Clarisse Rodriges, PA-C 06/06/14 0024  Arby BarretteMarcy Pfeiffer, MD 06/12/14 [phone]

## 2014-06-05 NOTE — ED Notes (Signed)
Pt reports a HA that started on Saturday and chest pain since Tuesday, pt reports he feels very uncomfortable. Pt reports he was started on chantix on May 19th and thought he was having a reaction to the medication. Pt reports he went to the doctor today to be evaluated for blurred vision and eye pain and was informed to come to the ED. A&O X4. Pt also reports n/v, states he has thrown up about 3 times today.

## 2014-06-05 NOTE — ED Notes (Signed)
Pt inquiring about wait, updated on plan of care.

## 2014-06-05 NOTE — ED Notes (Signed)
Attempted report 

## 2014-06-05 NOTE — H&P (Signed)
PCP:   Marylen PontoHOLT,LYNLEY S, MD   Chief Complaint:  Headache, blurred vision, chest pain  HPI: 44 yo male h/o htn, bipolar, schizoaffective disorder, panic attacks and migraines comes in with 3-5 day h/o bilateral temporal headache with associated vision blurriness and pressure to both eyes.  He has h/o migraines over 2 years ago, but this is much different does not have vision changes with previous migraines.  Some associated n/v, no fevers.  Headache is worse in the morning around 10am and improves thru the day.  He has been given compazine and benadryl with some improvement in headache in the ED but not vision changes.  Also complain of sscp that occurs when he gets anxious and gets sob, lasting minutes.  No cp now.  This is similar to when he has had panic attacks in past, but again has not had one of those for years.  No swelling in le or pain in le.  Reports had cardiac stress testing about 2 years ago which was normal (did not have cath per his report).  Pt did start chantix prior to all of these symptoms, which he stopped about 3 nights ago.  Review of Systems:  Positive and negative as per HPI otherwise all other systems are negative  Past Medical History: Past Medical History  Diagnosis Date  . Hypertension   . Bipolar 1 disorder   . Schizoaffective disorder    History reviewed. No pertinent past surgical history.  Medications: Prior to Admission medications   Medication Sig Start Date End Date Taking? Authorizing Provider  amLODipine (NORVASC) 10 MG tablet Take 10 mg by mouth daily.   Yes Historical Provider, MD  olmesartan-hydrochlorothiazide (BENICAR HCT) 40-25 MG per tablet Take 1 tablet by mouth daily.   Yes Historical Provider, MD  pantoprazole (PROTONIX) 40 MG tablet Take 40 mg by mouth daily.   Yes Historical Provider, MD    Allergies:   Allergies  Allergen Reactions  . Oxycodone     Itching    Social History:  reports that he has been smoking.  He does not have any  smokeless tobacco history on file. He reports that he does not drink alcohol. His drug history is not on file.  Family History: No CAD  Physical Exam: Filed Vitals:   06/05/14 2046 06/05/14 2130 06/05/14 2145 06/05/14 2200  BP: 143/79 139/92 155/94 155/83  Pulse: 87 81 82 82  Temp:      TempSrc: Oral     Resp: 16 18 15 16   SpO2: 98% 96% 93% 97%   General appearance: alert, cooperative and no distress Head: Normocephalic, without obvious abnormality, atraumatic Eyes: negative Nose: Nares normal. Septum midline. Mucosa normal. No drainage or sinus tenderness. Neck: no JVD and supple, symmetrical, trachea midline Lungs: clear to auscultation bilaterally Heart: regular rate and rhythm, S1, S2 normal, no murmur, click, rub or gallop Abdomen: soft, non-tender; bowel sounds normal; no masses,  no organomegaly Extremities: extremities normal, atraumatic, no cyanosis or edema Pulses: 2+ and symmetric Skin: Skin color, texture, turgor normal. No rashes or lesions Neurologic: Grossly normal  Cn 2-12 intact.  5/5 strength throughout, reflexes normal.  Sensation nml.    Labs on Admission:   Recent Labs  06/05/14 1630  NA 138  K 3.2*  CL 103  CO2 27  GLUCOSE 151*  BUN 10  CREATININE 0.95  CALCIUM 9.2    Recent Labs  06/05/14 1630  WBC 11.7*  HGB 14.3  HCT 40.3  MCV 85.0  PLT 295   Radiological Exams on Admission: Dg Chest 2 View  06/05/2014   CLINICAL DATA:  Chest pain for 2 days.  Smoker  EXAM: CHEST  2 VIEW  COMPARISON:  02/03/2014  FINDINGS: The heart size and mediastinal contours are within normal limits. Both lungs are clear. The visualized skeletal structures are unremarkable.  IMPRESSION: No active cardiopulmonary disease.   Electronically Signed   By: Signa Kell M.D.   On: 06/05/2014 16:56   Ct Head Wo Contrast  06/05/2014   CLINICAL DATA:  Headache started on Saturday, chest pain starting on Tuesday. Blurred vision and pain in the eyes. Nausea and vomiting.   EXAM: CT HEAD WITHOUT CONTRAST  TECHNIQUE: Contiguous axial images were obtained from the base of the skull through the vertex without intravenous contrast.  COMPARISON:  02/03/2014  FINDINGS: Mild chronic anterior ethmoid sinusitis. No orbital abnormality identified.  The brainstem, cerebellum, cerebral peduncles, thalamus, basal ganglia, basilar cisterns, and ventricular system appear within normal limits. No intracranial hemorrhage, mass lesion, or acute CVA.  IMPRESSION: 1. No significant intracranial abnormality identified. 2. Mild chronic anterior ethmoid sinusitis.   Electronically Signed   By: Gaylyn Rong M.D.   On: 06/05/2014 20:08   [name] Contrast  06/05/2014   CLINICAL DATA:  Initial evaluation for acute headache, blurry vision.  EXAM: MRI HEAD WITHOUT CONTRAST  TECHNIQUE: Multiplanar, multiecho pulse sequences of the brain and surrounding structures were obtained without intravenous contrast.  COMPARISON:  Prior CT from earlier the same day.  FINDINGS: The CSF containing spaces are within normal limits for patient age. No focal parenchymal signal abnormality is identified. No mass lesion, midline shift, or extra-axial fluid collection. Ventricles are normal in size without evidence of hydrocephalus.  No diffusion-weighted signal abnormality is identified to suggest acute intracranial infarct. Gray-white matter differentiation is maintained. Normal flow voids are seen within the intracranial vasculature. No intracranial hemorrhage identified.  The cervicomedullary junction is normal. Pituitary gland is within normal limits. Pituitary stalk is midline. The globes and optic nerves demonstrate a normal appearance with normal signal intensity.  The bone marrow signal intensity is normal. Calvarium is intact. Visualized upper cervical spine is within normal limits.  Scalp soft tissues are unremarkable.  Paranasal sinuses are clear.  No mastoid effusion.  IMPRESSION: Normal MRI of the brain with  no acute intracranial process identified.   Electronically Signed   By: Rise Mu M.D.   On: 06/05/2014 23:12   cxr reviewed by myself, no edema no infiltrate ekg reviewed with st seg depression in inferiorlateral leads  Assessment/Plan  44 yo male with 3 days of sscp, 5 days of headache and vision changes  Principal Problem:   Chest pain-  R/o ACS, cp free at this time.  Has ekg changes no previous to compare with.  Outpatient stress testing, if not really done 2 years ago.   Active Problems:   Schizoaffective disorder- stable   Hypertension- stable   Bipolar 1 disorder- stable   Headache- query if neurological symptoms from chantix   Vision changes-  Consult neurology.  Mri negative for stroke.     EKG abnormalities- as above  Pt seen before midnight.  obs on tele floor.  Full code.  Breckon,Abriana Saltos A 06/06/2014, 12:08 AM

## 2014-06-05 NOTE — Consult Note (Addendum)
Neurology Consultation Reason for Consult: Headache Referring Physician: Donnald GarrePfeiffer, M  CC: Migraine  History is obtained from: Patient  HPI: Cameron Bryan is a 44 y.o. male with a history of migraines that started on chantix last month and then 5 days ago started having severe throbbing photophobic headache. He was concerned that this may be a side effect of the chantix and therefore stopped taking it. He has continued to have severe headache and over the past two days has noted blurriness in bilateral eyes.   ROS: A 14 point ROS was performed and is negative except as noted in the HPI.   Past Medical History  Diagnosis Date  . Hypertension   . Bipolar 1 disorder   . Schizoaffective disorder     Family History: No hx migraine  Social History: Tob: current smoker.   Exam: Current vital signs: BP 155/83 mmHg  Pulse 82  Temp(Src) 98.9 F (37.2 C) (Oral)  Resp 16  SpO2 97% Vital signs in last 24 hours: Temp:  [98.9 F (37.2 C)] 98.9 F (37.2 C) (06/02 1614) Pulse Rate:  [79-99] 82 (06/02 2200) Resp:  [15-26] 16 (06/02 2200) BP: (134-160)/(72-94) 155/83 mmHg (06/02 2200) SpO2:  [93 %-100 %] 97 % (06/02 2200)  Physical Exam  Constitutional: Appears well-developed and well-nourished.  Psych: Affect appropriate to situation Eyes: No scleral injection, no papilledema HENT: No OP obstrucion Head: Normocephalic.  Cardiovascular: Normal rate and regular rhythm.  Respiratory: Effort normal and breath sounds normal to anterior ascultation GI: Soft.  No distension. There is no tenderness.  Skin: WDI  Neuro: Mental Status: Patient is awake, alert, oriented to person, place, month, year, and situation. Patient is able to give a clear and coherent history. No signs of aphasia or neglect Cranial Nerves: II: Visual Fields are full to finger movement, but he is unable to count reliably. Pupils are equal, round, and reactive to light.   III,IV, VI: EOMI without ptosis or  diploplia.  V: Facial sensation is symmetric to temperature VII: Facial movement is symmetric.  VIII: hearing is intact to voice X: Uvula elevates symmetrically XI: Shoulder shrug is symmetric. XII: tongue is midline without atrophy or fasciculations.  Motor: Tone is normal. Bulk is normal. 5/5 strength was present in all four extremities.  Sensory: Sensation is symmetric to light touch and temperature in the arms and legs. Deep Tendon Reflexes: 2+ and symmetric in the biceps and patellae.  Plantars: Toes are downgoing bilaterally.  Cerebellar: FNF and HKS are intact bilaterally   I have reviewed labs in epic and the results pertinent to this consultation are: Bmp - unremarkable  I have reviewed the images obtained:MRI brain - negative.   Impression: 44 yo M with blurred vision in the setting of a photophobic bitemporal thrombing headache. I suspect that this represents migraines especially given his history of (less severe) blurred vision with migraines in the past. Of note, varenicline can cause migraines to worsen. The major differential which I feel is less likely would be fulminant pseudotumor. Given the likelihood that this represents migraine, however, I would favor treating this first and perform LP only if his vision does not improve.   Recommendations: 1) Will treat for migraine with compazine, benadryl, depakote, mag, toradol. 2) If no improvement in vision by tomorrow, will need LP to check opening pressure for pseudotumor.   Ritta SlotMcNeill Kirkpatrick, MD [facility] [phone]  If 7pm- 7am, please page neurology on call as listed in AMION.

## 2014-06-06 ENCOUNTER — Ambulatory Visit (HOSPITAL_COMMUNITY): Payer: Commercial Managed Care - HMO

## 2014-06-06 DIAGNOSIS — R0789 Other chest pain: Secondary | ICD-10-CM | POA: Diagnosis not present

## 2014-06-06 DIAGNOSIS — G43101 Migraine with aura, not intractable, with status migrainosus: Secondary | ICD-10-CM | POA: Diagnosis not present

## 2014-06-06 DIAGNOSIS — F319 Bipolar disorder, unspecified: Secondary | ICD-10-CM | POA: Diagnosis not present

## 2014-06-06 DIAGNOSIS — H538 Other visual disturbances: Secondary | ICD-10-CM | POA: Diagnosis not present

## 2014-06-06 DIAGNOSIS — R51 Headache: Secondary | ICD-10-CM | POA: Diagnosis not present

## 2014-06-06 DIAGNOSIS — H539 Unspecified visual disturbance: Secondary | ICD-10-CM | POA: Diagnosis not present

## 2014-06-06 MED ORDER — ONDANSETRON HCL 4 MG/2ML IJ SOLN: 4.0000 mg | Freq: Four times a day (QID) | INTRAMUSCULAR | Status: DC | PRN

## 2014-06-06 MED ORDER — ACETAMINOPHEN 325 MG PO TABS: 650.0000 mg | ORAL_TABLET | ORAL | Status: DC | PRN

## 2014-06-06 MED ORDER — HYDROCHLOROTHIAZIDE 25 MG PO TABS
25.0000 mg | ORAL_TABLET | Freq: Every day | ORAL | Status: DC
  Filled 2014-06-06: qty 1

## 2014-06-06 MED ORDER — ASPIRIN EC 325 MG PO TBEC
325.0000 mg | DELAYED_RELEASE_TABLET | Freq: Every day | ORAL | Status: DC
  Filled 2014-06-06: qty 1

## 2014-06-06 MED ORDER — PANTOPRAZOLE SODIUM 40 MG PO TBEC: 40.0000 mg | DELAYED_RELEASE_TABLET | Freq: Every day | ORAL | Status: DC

## 2014-06-06 MED ORDER — GI COCKTAIL ~~LOC~~
30.0000 mL | Freq: Four times a day (QID) | ORAL | Status: DC | PRN
  Filled 2014-06-06: qty 30

## 2014-06-06 MED ORDER — IRBESARTAN 300 MG PO TABS
300.0000 mg | ORAL_TABLET | Freq: Every day | ORAL | Status: DC
  Filled 2014-06-06: qty 1

## 2014-06-06 MED ORDER — OLMESARTAN MEDOXOMIL-HCTZ 40-25 MG PO TABS: 1.0000 | ORAL_TABLET | Freq: Every day | ORAL | Status: DC

## 2014-06-06 MED ORDER — AMLODIPINE BESYLATE 10 MG PO TABS
10.0000 mg | ORAL_TABLET | Freq: Every day | ORAL | Status: DC
  Filled 2014-06-06: qty 1

## 2014-06-06 NOTE — Discharge Instructions (Signed)
Blurred Vision °You have been seen today complaining of blurred vision. This means you have a loss of ability to see small details.  °CAUSES  °Blurred vision can be a symptom of underlying eye problems, such as: °· Aging of the eye (presbyopia). °· Glaucoma. °· Cataracts. °· Eye infection. °· Eye-related migraine. °· Diabetes mellitus. °· Fatigue. °· Migraine headaches. °· High blood pressure. °· Breakdown of the back of the eye (macular degeneration). °· Problems caused by some medications. °The most common cause of blurred vision is the need for eyeglasses or a new prescription. Today in the emergency department, no cause for your blurred vision can be found. °SYMPTOMS  °Blurred vision is the loss of visual sharpness and detail (acuity). °DIAGNOSIS  °Should blurred vision continue, you should see your caregiver. If your caregiver is your primary care physician, he or she may choose to refer you to another specialist.  °TREATMENT  °Do not ignore your blurred vision. Make sure to have it checked out to see if further treatment or referral is necessary. °SEEK MEDICAL CARE IF:  °You are unable to get into a specialist so we can help you with a referral. °SEEK IMMEDIATE MEDICAL CARE IF: °You have severe eye pain, severe headache, or sudden loss of vision. °MAKE SURE YOU:  °· Understand these instructions. °· Will watch your condition. °· Will get help right away if you are not doing well or get worse. °Document Released: 12/23/2002 Document Revised: 03/14/2011 Document Reviewed: 07/25/2007 °[employer]® Patient Information ©2015 [employer]. This information is not intended to replace advice given to you by your health care provider. Make sure you discuss any questions you have with your health care provider. ° °

## 2014-06-06 NOTE — Progress Notes (Signed)
Cardiac monitor discontinued per order. Home discharge instructions discussed with patient and wife. Follow up appt given. Patient understands instructions by using teachback. Patient taken out of hospital via wheelchair/

## 2014-06-06 NOTE — Progress Notes (Signed)
Subjective: No further HA or blurred vision.   Objective: Current vital signs: BP 135/86 mmHg  Pulse 71  Temp(Src) 97.7 F (36.5 C) (Oral)  Resp 19  Ht  (1.753 m)  Wt 99.474 kg (219 lb 4.8 oz)  BMI 32.37 kg/m2  SpO2 100% Vital signs in last 24 hours: Temp:  [97.7 F (36.5 C)-98.9 F (37.2 C)] 97.7 F (36.5 C) (06/03 0537) Pulse Rate:  [71-99] 71 (06/03 0537) Resp:  [15-26] 19 (06/03 0537) BP: (119-160)/(67-94) 135/86 mmHg (06/03 0537) SpO2:  [93 %-100 %] 100 % (06/03 0537) Weight:  [99.474 kg (219 lb 4.8 oz)] 99.474 kg (219 lb 4.8 oz) (06/03 0537)  Intake/Output from previous day: 06/02 0701 - 06/03 0700 In: 350 [P.O.:240; IV Piggyback:110] Out: -  Intake/Output this shift: Total I/O In: 360 [P.O.:360] Out: 400 [Urine:400] Nutritional status: Diet Heart Room service appropriate?: Yes; Fluid consistency:: Thin Diet - low sodium heart healthy  Neurologic Exam: General: Mental Status: Alert, oriented, thought content appropriate.  Speech fluent without evidence of aphasia.  Able to follow 3 step commands without difficulty. Cranial Nerves: II: Discs flat bilaterally; Visual fields grossly normal, pupils equal, round, reactive to light and accommodation III,IV, VI: ptosis not present, extra-ocular motions intact bilaterally V,VII: smile symmetric, facial light touch sensation normal bilaterally VIII: hearing normal bilaterally IX,X: uvula rises symmetrically XI: bilateral shoulder shrug XII: midline tongue extension without atrophy or fasciculations  Motor: Right : Upper extremity   5/5    Left:     Upper extremity   5/5  Lower extremity   5/5     Lower extremity   5/5 Tone and bulk:normal tone throughout; no atrophy noted Sensory: Pinprick and light touch intact throughout, bilaterally Deep Tendon Reflexes:  Right: Upper Extremity   Left: Upper extremity   biceps (C-5 to C-6) 2/4   biceps (C-5 to C-6) 2/4 tricep (C7) 2/4    triceps (C7) 2/4 Brachioradialis  (C6) 2/4  Brachioradialis (C6) 2/4  Lower Extremity Lower Extremity  quadriceps (L-2 to L-4) 2/4   quadriceps (L-2 to L-4) 2/4 Achilles (S1) 2/4   Achilles (S1) 2/4  Plantars: Right: downgoing   Left: downgoing Cerebellar: normal finger-to-nose,  normal heel-to-shin test    Lab Results: Basic Metabolic Panel:  Recent Labs Lab 06/05/14 1630  NA 138  K 3.2*  CL 103  CO2 27  GLUCOSE 151*  BUN 10  CREATININE 0.95  CALCIUM 9.2    Liver Function Tests: No results for input(s): AST, ALT, ALKPHOS, BILITOT, PROT, ALBUMIN in the last 168 hours. No results for input(s): LIPASE, AMYLASE in the last 168 hours. No results for input(s): AMMONIA in the last 168 hours.  CBC:  Recent Labs Lab 06/05/14 1630  WBC 11.7*  HGB 14.3  HCT 40.3  MCV 85.0  PLT 295    Cardiac Enzymes: No results for input(s): CKTOTAL, CKMB, CKMBINDEX, TROPONINI in the last 168 hours.  Lipid Panel: No results for input(s): CHOL, TRIG, HDL, CHOLHDL, VLDL, LDLCALC in the last 168 hours.  CBG: No results for input(s): GLUCAP in the last 168 hours.  Microbiology: No results found for this or any previous visit.  Coagulation Studies: No results for input(s): LABPROT, INR in the last 72 hours.  Imaging: Dg Chest 2 View  06/05/2014   CLINICAL DATA:  Chest pain for 2 days.  Smoker  EXAM: CHEST  2 VIEW  COMPARISON:  02/03/2014  FINDINGS: The heart size and mediastinal contours are within normal limits. Both  lungs are clear. The visualized skeletal structures are unremarkable.  IMPRESSION: No active cardiopulmonary disease.   Electronically Signed   By: Signa Kellaylor  Stroud M.D.   On: 06/05/2014 16:56   Ct Head Wo Contrast  06/05/2014   CLINICAL DATA:  Headache started on Saturday, chest pain starting on Tuesday. Blurred vision and pain in the eyes. Nausea and vomiting.  EXAM: CT HEAD WITHOUT CONTRAST  TECHNIQUE: Contiguous axial images were obtained from the base of the skull through the vertex without  intravenous contrast.  COMPARISON:  02/03/2014  FINDINGS: Mild chronic anterior ethmoid sinusitis. No orbital abnormality identified.  The brainstem, cerebellum, cerebral peduncles, thalamus, basal ganglia, basilar cisterns, and ventricular system appear within normal limits. No intracranial hemorrhage, mass lesion, or acute CVA.  IMPRESSION: 1. No significant intracranial abnormality identified. 2. Mild chronic anterior ethmoid sinusitis.   Electronically Signed   By: Gaylyn RongWalter  Liebkemann M.D.   On: 06/05/2014 20:08   [name] Contrast  06/05/2014   CLINICAL DATA:  Initial evaluation for acute headache, blurry vision.  EXAM: MRI HEAD WITHOUT CONTRAST  TECHNIQUE: Multiplanar, multiecho pulse sequences of the brain and surrounding structures were obtained without intravenous contrast.  COMPARISON:  Prior CT from earlier the same day.  FINDINGS: The CSF containing spaces are within normal limits for patient age. No focal parenchymal signal abnormality is identified. No mass lesion, midline shift, or extra-axial fluid collection. Ventricles are normal in size without evidence of hydrocephalus.  No diffusion-weighted signal abnormality is identified to suggest acute intracranial infarct. Gray-white matter differentiation is maintained. Normal flow voids are seen within the intracranial vasculature. No intracranial hemorrhage identified.  The cervicomedullary junction is normal. Pituitary gland is within normal limits. Pituitary stalk is midline. The globes and optic nerves demonstrate a normal appearance with normal signal intensity.  The bone marrow signal intensity is normal. Calvarium is intact. Visualized upper cervical spine is within normal limits.  Scalp soft tissues are unremarkable.  Paranasal sinuses are clear.  No mastoid effusion.  IMPRESSION: Normal MRI of the brain with no acute intracranial process identified.   Electronically Signed   By: Rise MuBenjamin  McClintock M.D.   On: 06/05/2014 23:12     Medications:  Scheduled: . amLODipine  10 mg Oral Daily  . aspirin EC  325 mg Oral Daily  . prochlorperazine  10 mg Intravenous Q6H   And  . diphenhydrAMINE  12.5 mg Oral Q6H  . irbesartan  300 mg Oral Daily   And  . hydrochlorothiazide  25 mg Oral Daily  . pantoprazole  40 mg Oral Daily    Assessment/Plan:  44 YO male with migraine HA.  HA aborted with migraine cocktail.  No further recommendations. Follow up with out patient PCP or neurologist. S/O  Felicie Mornavid Smith PA-C [facility] [phone]  06/06/2014, 9:47 AM  I personally participated in this patient's evaluation and management, including formulating above clinical impression and management recommendations.  Venetia MaxonR Shanyiah Conde M.D. [facility] [phone]

## 2014-06-06 NOTE — Progress Notes (Signed)
Patient states he wants to leave. Patient aggravated that breakfast tray not correct and that patient hooked up to IV pole. Patient saline locked. Patient's only request at this time is for some sweet tea. Sweet tea ordered. Patient also states that he has already contacted his wife and told her to come get him, he will be leaving. Dr. Elisabeth Pigeonevine on unit and made aware. MD to see patient.

## 2014-06-06 NOTE — Discharge Summary (Signed)
Physician Discharge Summary  Cameron Bryan RUE:[id number] DOB: March 13, 1970 DOA: 06/05/2014  PCP: Marylen Ponto, MD  Admit date: 06/05/2014 Discharge date: 06/06/2014  Recommendations for Outpatient Follow-up:  1.  Patient will follow-up with primary care physician in one to 2 weeks after discharge to make sure  That symptoms are stable  Discharge Diagnoses:  Principal Problem:   Chest pain Active Problems:   Schizoaffective disorder   Hypertension   Bipolar 1 disorder   Headache   Vision changes   EKG abnormalities    Discharge Condition: stable   Diet recommendation: as tolerated   History of present illness:  44 yo male with past medical history of hypertension, bipolar disorder, schizoaffective disorder, migraine headaches who presented to [facility] with reports of worsening intractable headache over past couple of days prior to this admission in the frontal area, 10 out of 10 in intensity, intermittent. Headache is associated with blurry vision but no gait instability. No lightheadedness or falls. No neck stiffness or fevers. No nausea or vomiting. Noise or light does not particularly affect the headache. Patient was given Compazine and Benadryl in ED and these seem to have helped with headaches. Off note, he reported midsternal intermittent chest pain on admission but this morning he reports his chest pain completely resolved. No shortness of breath or palpitations.  Patient reports feeling great this morning. He insists on going home today. His blurry vision has completely resolved.   Hospital Course:   Principal Problem:   Chest pain - Likely musculoskeletal. - Troponin level was within normal limits. - No acute ischemic changes seen on 12-lead EKG. - Chest pain completely resolved this morning.  Active Problems:   Schizoaffective disorder / bipolar disorder - Stable. Continue to follow-up with psychiatry.    Blurry vision / intractable frontal headache -  Likely migraine headache. - Patient has received Compazine in ED and his symptoms have completely resolved. - He currently wishes to go home. He is medically stable from medicine standpoint. - No changes in medications made on discharge.    Essential hypertension - Continue blood pressure medications as per prior to this admission.    SignedManson Passey, MD  [facility] 06/06/2014, 9:35 AM  Pager #: [phone]  Time spent in minutes: less than 30 minutes    Discharge Exam: Filed Vitals:   06/06/14 0537  BP: 135/86  Pulse: 71  Temp: 97.7 F (36.5 C)  Resp: 19   Filed Vitals:   06/05/14 2200 06/06/14 0008 06/06/14 0038 06/06/14 0537  BP: 155/83 119/67 149/90 135/86  Pulse: 82  76 71  Temp:   97.8 F (36.6 C) 97.7 F (36.5 C)  TempSrc:   Oral Oral  Resp: Height:    (1.753 m)   Weight:   99.474 kg (219 lb 4.8 oz) 99.474 kg (219 lb 4.8 oz)  SpO2: 97%  98% 100%    General: Pt is alert, follows commands appropriately, not in acute distress Cardiovascular: Regular rate and rhythm, S1/S2 +, no murmurs Respiratory: Clear to auscultation bilaterally, no wheezing, no crackles, no rhonchi Abdominal: Soft, non tender, non distended, bowel sounds +, no guarding Extremities: no edema, no cyanosis, pulses palpable bilaterally DP and PT Neuro: Grossly nonfocal  Discharge Instructions  Discharge Instructions    Call MD for:  difficulty breathing, headache or visual disturbances    Complete by:  As directed      Call MD for:  persistant nausea and  vomiting    Complete by:  As directed      Call MD for:  severe uncontrolled pain    Complete by:  As directed      Diet - low sodium heart healthy    Complete by:  As directed      Increase activity slowly    Complete by:  As directed             Medication List    TAKE these medications        amLODipine 10 MG tablet  Commonly known as:  NORVASC  Take 10 mg by mouth daily.      olmesartan-hydrochlorothiazide 40-25 MG per tablet  Commonly known as:  BENICAR HCT  Take 1 tablet by mouth daily.     pantoprazole 40 MG tablet  Commonly known as:  PROTONIX  Take 40 mg by mouth daily.           Follow-up Information    Follow up with Marylen Ponto, MD.   Specialty:  Family Medicine   Contact information:   [address] [place] [address] [phone]        The results of significant diagnostics from this hospitalization (including imaging, microbiology, ancillary and laboratory) are listed below for reference.    Significant Diagnostic Studies: Dg Chest 2 View  06/05/2014   CLINICAL DATA:  Chest pain for 2 days.  Smoker  EXAM: CHEST  2 VIEW  COMPARISON:  02/03/2014  FINDINGS: The heart size and mediastinal contours are within normal limits. Both lungs are clear. The visualized skeletal structures are unremarkable.  IMPRESSION: No active cardiopulmonary disease.   Electronically Signed   By: Signa Kell M.D.   On: 06/05/2014 16:56   Ct Head Wo Contrast  06/05/2014   CLINICAL DATA:  Headache started on Saturday, chest pain starting on Tuesday. Blurred vision and pain in the eyes. Nausea and vomiting.  EXAM: CT HEAD WITHOUT CONTRAST  TECHNIQUE: Contiguous axial images were obtained from the base of the skull through the vertex without intravenous contrast.  COMPARISON:  02/03/2014  FINDINGS: Mild chronic anterior ethmoid sinusitis. No orbital abnormality identified.  The brainstem, cerebellum, cerebral peduncles, thalamus, basal ganglia, basilar cisterns, and ventricular system appear within normal limits. No intracranial hemorrhage, mass lesion, or acute CVA.  IMPRESSION: 1. No significant intracranial abnormality identified. 2. Mild chronic anterior ethmoid sinusitis.   Electronically Signed   By: Gaylyn Rong M.D.   On: 06/05/2014 20:08   [name] Contrast  06/05/2014   CLINICAL DATA:  Initial evaluation for acute headache, blurry vision.  EXAM:  MRI HEAD WITHOUT CONTRAST  TECHNIQUE: Multiplanar, multiecho pulse sequences of the brain and surrounding structures were obtained without intravenous contrast.  COMPARISON:  Prior CT from earlier the same day.  FINDINGS: The CSF containing spaces are within normal limits for patient age. No focal parenchymal signal abnormality is identified. No mass lesion, midline shift, or extra-axial fluid collection. Ventricles are normal in size without evidence of hydrocephalus.  No diffusion-weighted signal abnormality is identified to suggest acute intracranial infarct. Gray-white matter differentiation is maintained. Normal flow voids are seen within the intracranial vasculature. No intracranial hemorrhage identified.  The cervicomedullary junction is normal. Pituitary gland is within normal limits. Pituitary stalk is midline. The globes and optic nerves demonstrate a normal appearance with normal signal intensity.  The bone marrow signal intensity is normal. Calvarium is intact. Visualized upper cervical spine is within normal limits.  Scalp soft tissues are  unremarkable.  Paranasal sinuses are clear.  No mastoid effusion.  IMPRESSION: Normal MRI of the brain with no acute intracranial process identified.   Electronically Signed   By: Rise MuBenjamin  McClintock M.D.   On: 06/05/2014 23:12    Microbiology: No results found for this or any previous visit (from the past 240 hour(s)).   Labs: Basic Metabolic Panel:  Recent Labs Lab 06/05/14 1630  NA 138  K 3.2*  CL 103  CO2 27  GLUCOSE 151*  BUN 10  CREATININE 0.95  CALCIUM 9.2   Liver Function Tests: No results for input(s): AST, ALT, ALKPHOS, BILITOT, PROT, ALBUMIN in the last 168 hours. No results for input(s): LIPASE, AMYLASE in the last 168 hours. No results for input(s): AMMONIA in the last 168 hours. CBC:  Recent Labs Lab 06/05/14 1630  WBC 11.7*  HGB 14.3  HCT 40.3  MCV 85.0  PLT 295   Cardiac Enzymes: No results for input(s): CKTOTAL,  CKMB, CKMBINDEX, TROPONINI in the last 168 hours. BNP: BNP (last 3 results)  Recent Labs  06/05/14 1630  BNP 37.1   P (lddcast 3 result CBG: No results for input(s): GLUCAP in the last 168 hours.

## 2015-03-06 DIAGNOSIS — S46312A Strain of muscle, fascia and tendon of triceps, left arm, initial encounter: Secondary | ICD-10-CM | POA: Diagnosis not present

## 2015-03-17 DIAGNOSIS — M25522 Pain in left elbow: Secondary | ICD-10-CM | POA: Diagnosis not present

## 2015-03-22 DIAGNOSIS — J111 Influenza due to unidentified influenza virus with other respiratory manifestations: Secondary | ICD-10-CM | POA: Diagnosis not present

## 2015-03-23 DIAGNOSIS — S46312D Strain of muscle, fascia and tendon of triceps, left arm, subsequent encounter: Secondary | ICD-10-CM | POA: Diagnosis not present

## 2015-03-24 DIAGNOSIS — H5213 Myopia, bilateral: Secondary | ICD-10-CM | POA: Diagnosis not present

## 2015-03-24 DIAGNOSIS — H16223 Keratoconjunctivitis sicca, not specified as Sjogren's, bilateral: Secondary | ICD-10-CM | POA: Diagnosis not present

## 2015-04-02 DIAGNOSIS — F17200 Nicotine dependence, unspecified, uncomplicated: Secondary | ICD-10-CM | POA: Diagnosis not present

## 2015-04-02 DIAGNOSIS — Z01818 Encounter for other preprocedural examination: Secondary | ICD-10-CM | POA: Diagnosis not present

## 2015-04-08 DIAGNOSIS — Y999 Unspecified external cause status: Secondary | ICD-10-CM | POA: Diagnosis not present

## 2015-04-08 DIAGNOSIS — S46302A Unspecified injury of muscle, fascia and tendon of triceps, left arm, initial encounter: Secondary | ICD-10-CM | POA: Diagnosis not present

## 2015-04-08 DIAGNOSIS — Y929 Unspecified place or not applicable: Secondary | ICD-10-CM | POA: Diagnosis not present

## 2015-04-08 DIAGNOSIS — S46312D Strain of muscle, fascia and tendon of triceps, left arm, subsequent encounter: Secondary | ICD-10-CM | POA: Diagnosis not present

## 2015-04-08 DIAGNOSIS — Y939 Activity, unspecified: Secondary | ICD-10-CM | POA: Diagnosis not present

## 2015-04-08 DIAGNOSIS — W19XXXA Unspecified fall, initial encounter: Secondary | ICD-10-CM | POA: Diagnosis not present

## 2015-04-08 DIAGNOSIS — M7022 Olecranon bursitis, left elbow: Secondary | ICD-10-CM | POA: Diagnosis not present

## 2015-04-16 DIAGNOSIS — Z4789 Encounter for other orthopedic aftercare: Secondary | ICD-10-CM | POA: Diagnosis not present

## 2015-04-21 DIAGNOSIS — S46312D Strain of muscle, fascia and tendon of triceps, left arm, subsequent encounter: Secondary | ICD-10-CM | POA: Diagnosis not present

## 2015-04-29 DIAGNOSIS — R531 Weakness: Secondary | ICD-10-CM | POA: Diagnosis not present

## 2015-04-29 DIAGNOSIS — R4182 Altered mental status, unspecified: Secondary | ICD-10-CM | POA: Diagnosis not present

## 2015-04-29 DIAGNOSIS — R079 Chest pain, unspecified: Secondary | ICD-10-CM | POA: Diagnosis not present

## 2015-05-01 DIAGNOSIS — E876 Hypokalemia: Secondary | ICD-10-CM | POA: Diagnosis not present

## 2015-05-18 DIAGNOSIS — Z79899 Other long term (current) drug therapy: Secondary | ICD-10-CM | POA: Diagnosis not present

## 2015-05-28 DIAGNOSIS — J40 Bronchitis, not specified as acute or chronic: Secondary | ICD-10-CM | POA: Diagnosis not present

## 2015-05-28 DIAGNOSIS — J329 Chronic sinusitis, unspecified: Secondary | ICD-10-CM | POA: Diagnosis not present

## 2015-07-10 DIAGNOSIS — M436 Torticollis: Secondary | ICD-10-CM | POA: Diagnosis not present

## 2015-07-16 DIAGNOSIS — I10 Essential (primary) hypertension: Secondary | ICD-10-CM | POA: Diagnosis not present

## 2015-07-16 DIAGNOSIS — M542 Cervicalgia: Secondary | ICD-10-CM | POA: Diagnosis not present

## 2015-07-16 DIAGNOSIS — Z0000 Encounter for general adult medical examination without abnormal findings: Secondary | ICD-10-CM | POA: Diagnosis not present

## 2015-07-16 DIAGNOSIS — K5900 Constipation, unspecified: Secondary | ICD-10-CM | POA: Diagnosis not present

## 2015-07-20 DIAGNOSIS — R7989 Other specified abnormal findings of blood chemistry: Secondary | ICD-10-CM | POA: Diagnosis not present

## 2015-07-27 DIAGNOSIS — R7989 Other specified abnormal findings of blood chemistry: Secondary | ICD-10-CM | POA: Diagnosis not present

## 2015-07-28 DIAGNOSIS — S46312D Strain of muscle, fascia and tendon of triceps, left arm, subsequent encounter: Secondary | ICD-10-CM | POA: Diagnosis not present

## 2015-07-29 DIAGNOSIS — M542 Cervicalgia: Secondary | ICD-10-CM | POA: Diagnosis not present

## 2015-07-29 DIAGNOSIS — M436 Torticollis: Secondary | ICD-10-CM | POA: Diagnosis not present

## 2015-07-29 DIAGNOSIS — M5030 Other cervical disc degeneration, unspecified cervical region: Secondary | ICD-10-CM | POA: Diagnosis not present

## 2015-08-07 DIAGNOSIS — G4733 Obstructive sleep apnea (adult) (pediatric): Secondary | ICD-10-CM | POA: Diagnosis not present

## 2015-08-07 DIAGNOSIS — E860 Dehydration: Secondary | ICD-10-CM | POA: Diagnosis not present

## 2015-08-07 DIAGNOSIS — Z8619 Personal history of other infectious and parasitic diseases: Secondary | ICD-10-CM | POA: Diagnosis not present

## 2015-08-07 DIAGNOSIS — Z79899 Other long term (current) drug therapy: Secondary | ICD-10-CM | POA: Diagnosis not present

## 2015-08-07 DIAGNOSIS — I6523 Occlusion and stenosis of bilateral carotid arteries: Secondary | ICD-10-CM | POA: Diagnosis not present

## 2015-08-07 DIAGNOSIS — G43909 Migraine, unspecified, not intractable, without status migrainosus: Secondary | ICD-10-CM | POA: Diagnosis not present

## 2015-08-07 DIAGNOSIS — Z7982 Long term (current) use of aspirin: Secondary | ICD-10-CM | POA: Diagnosis not present

## 2015-08-07 DIAGNOSIS — Z23 Encounter for immunization: Secondary | ICD-10-CM | POA: Diagnosis not present

## 2015-08-07 DIAGNOSIS — I10 Essential (primary) hypertension: Secondary | ICD-10-CM | POA: Diagnosis not present

## 2015-08-07 DIAGNOSIS — I639 Cerebral infarction, unspecified: Secondary | ICD-10-CM | POA: Diagnosis not present

## 2015-08-07 DIAGNOSIS — J449 Chronic obstructive pulmonary disease, unspecified: Secondary | ICD-10-CM | POA: Diagnosis not present

## 2015-08-07 DIAGNOSIS — F17210 Nicotine dependence, cigarettes, uncomplicated: Secondary | ICD-10-CM | POA: Diagnosis not present

## 2015-08-07 DIAGNOSIS — R202 Paresthesia of skin: Secondary | ICD-10-CM | POA: Diagnosis not present

## 2015-08-07 DIAGNOSIS — R531 Weakness: Secondary | ICD-10-CM | POA: Diagnosis not present

## 2015-08-07 DIAGNOSIS — R29898 Other symptoms and signs involving the musculoskeletal system: Secondary | ICD-10-CM | POA: Diagnosis not present

## 2015-08-07 DIAGNOSIS — Z8673 Personal history of transient ischemic attack (TIA), and cerebral infarction without residual deficits: Secondary | ICD-10-CM | POA: Diagnosis not present

## 2015-08-07 DIAGNOSIS — R209 Unspecified disturbances of skin sensation: Secondary | ICD-10-CM | POA: Diagnosis not present

## 2015-08-08 DIAGNOSIS — R531 Weakness: Secondary | ICD-10-CM | POA: Diagnosis not present

## 2015-08-08 DIAGNOSIS — G459 Transient cerebral ischemic attack, unspecified: Secondary | ICD-10-CM | POA: Diagnosis not present

## 2015-08-10 DIAGNOSIS — I6990 Unspecified sequelae of unspecified cerebrovascular disease: Secondary | ICD-10-CM | POA: Diagnosis not present

## 2015-09-16 DIAGNOSIS — R0789 Other chest pain: Secondary | ICD-10-CM | POA: Diagnosis not present

## 2015-09-16 DIAGNOSIS — I160 Hypertensive urgency: Secondary | ICD-10-CM | POA: Diagnosis not present

## 2015-09-18 DIAGNOSIS — Z23 Encounter for immunization: Secondary | ICD-10-CM | POA: Diagnosis not present

## 2015-09-18 DIAGNOSIS — Z0000 Encounter for general adult medical examination without abnormal findings: Secondary | ICD-10-CM | POA: Diagnosis not present

## 2015-10-10 DIAGNOSIS — S0181XA Laceration without foreign body of other part of head, initial encounter: Secondary | ICD-10-CM | POA: Diagnosis not present

## 2015-10-10 DIAGNOSIS — S93402A Sprain of unspecified ligament of left ankle, initial encounter: Secondary | ICD-10-CM | POA: Diagnosis not present

## 2015-10-12 DIAGNOSIS — S8992XS Unspecified injury of left lower leg, sequela: Secondary | ICD-10-CM | POA: Diagnosis not present

## 2015-10-17 DIAGNOSIS — S060X0A Concussion without loss of consciousness, initial encounter: Secondary | ICD-10-CM | POA: Diagnosis not present

## 2015-10-17 DIAGNOSIS — R410 Disorientation, unspecified: Secondary | ICD-10-CM | POA: Diagnosis not present

## 2015-10-23 DIAGNOSIS — T148XXA Other injury of unspecified body region, initial encounter: Secondary | ICD-10-CM | POA: Diagnosis not present

## 2015-11-18 ENCOUNTER — Emergency Department (HOSPITAL_COMMUNITY): Payer: Medicare Other

## 2015-11-18 ENCOUNTER — Encounter (HOSPITAL_COMMUNITY): Payer: Self-pay | Admitting: *Deleted

## 2015-11-18 ENCOUNTER — Emergency Department (HOSPITAL_COMMUNITY)
Admission: EM | Admit: 2015-11-18 | Discharge: 2015-11-19 | Disposition: A | Discharge status: Home / Self Care | Payer: Medicare Other | Attending: Emergency Medicine | Admitting: Emergency Medicine

## 2015-11-18 DIAGNOSIS — R42 Dizziness and giddiness: Secondary | ICD-10-CM | POA: Diagnosis not present

## 2015-11-18 DIAGNOSIS — F0781 Postconcussional syndrome: Secondary | ICD-10-CM | POA: Diagnosis not present

## 2015-11-18 DIAGNOSIS — I10 Essential (primary) hypertension: Secondary | ICD-10-CM | POA: Insufficient documentation

## 2015-11-18 DIAGNOSIS — Y999 Unspecified external cause status: Secondary | ICD-10-CM | POA: Insufficient documentation

## 2015-11-18 DIAGNOSIS — Y939 Activity, unspecified: Secondary | ICD-10-CM | POA: Insufficient documentation

## 2015-11-18 DIAGNOSIS — R51 Headache: Secondary | ICD-10-CM | POA: Diagnosis present

## 2015-11-18 DIAGNOSIS — F17200 Nicotine dependence, unspecified, uncomplicated: Secondary | ICD-10-CM | POA: Insufficient documentation

## 2015-11-18 DIAGNOSIS — Y929 Unspecified place or not applicable: Secondary | ICD-10-CM | POA: Diagnosis not present

## 2015-11-18 DIAGNOSIS — S0990XA Unspecified injury of head, initial encounter: Secondary | ICD-10-CM | POA: Diagnosis not present

## 2015-11-18 DIAGNOSIS — W14XXXA Fall from tree, initial encounter: Secondary | ICD-10-CM | POA: Diagnosis not present

## 2015-11-18 MED ORDER — ONDANSETRON 4 MG PO TBDP: 4.0000 mg | ORAL_TABLET | Freq: Three times a day (TID) | ORAL | 0 refills | Status: DC | PRN

## 2015-11-18 MED ORDER — MECLIZINE HCL 25 MG PO TABS
50.0000 mg | ORAL_TABLET | Freq: Once | ORAL | Status: AC
  Administered 2015-11-18: 50 mg via ORAL
  Filled 2015-11-18: qty 2

## 2015-11-18 MED ORDER — MECLIZINE HCL 25 MG PO TABS: 25.0000 mg | ORAL_TABLET | Freq: Three times a day (TID) | ORAL | 0 refills | Status: DC | PRN

## 2015-11-18 NOTE — ED Triage Notes (Signed)
Pt reports having head injury that required staples on 10/7. Had ct scan done on 10/14 and since then having tunnel vision and reports no longer having sense of hunger, hold/cold. Reports fatigue and feeling lightheaded. Was sent here for further exam.

## 2015-11-18 NOTE — ED Provider Notes (Signed)
[facility]-EMER[facility]ENCY DEPT Provider Note   CSN: [id number] Arrival date & time: 11/18/15  1800     History   Chief Complaint Chief Complaint  Patient presents with  . Visual Field Change    HPI Cameron Bryan is a 45 y.o. male with a hx of HTN, Schizoaffective disorder, and bipolar, presents to the emergency department noting a history of a fall from a tree stand on 10/7 where and the patient was coming down a tree with a harness, lost his footing, and fell backwards, dangling from the harness striking the back of his head against the tree. He had positive loss of consciousness for an unknown period of time. Since this fall the patient has had waxing and waning pressure like headaches with some waxing and waning tunnel vision and blurred vision, predominantly in his right eye. He also notes some symptoms of decreased sensation of hot and cold, but denies any lateralization of these symptoms. He states that he has had some decreased appetite with his symptoms, particularly when they become severe and has had some intermittent nausea and vomiting. Additionally he has been having some vertiginous symptoms, particularly with looking up and to the left. He states that these vertigo symptoms get better when he holds his head still. He denies any weakness or numbness. He states that because of his intermittent visual changes he has not been wearing his glasses and states that this could be contributing to his visual symptoms of blurriness. He has been taking tylenol intermittently for his symptoms and has had improvement in his sx though transiently.  He denies any further head trauma since his initial fall.   HPI  Past Medical History:  Diagnosis Date  . Bipolar 1 disorder (HCC)   . Hypertension   . Schizoaffective disorder [facility])     Patient Active Problem List   Diagnosis Date Noted  . Headache 06/05/2014  . Vision changes 06/05/2014  . Chest pain 06/05/2014  . EKG abnormalities 06/05/2014  .  Schizoaffective disorder (HCC)   . Hypertension   . Bipolar 1 disorder (HCC)   . Abnormal ECG   . Cephalalgia   . Blurred vision   . Essential hypertension     History reviewed. No pertinent surgical history.     Home Medications    Prior to Admission medications   Medication Sig Start Date End Date Taking? Authorizing Provider  amLODipine (NORVASC) 10 MG tablet Take 10 mg by mouth daily.    Historical Provider, MD  meclizine (ANTIVERT) 25 MG tablet Take 1 tablet (25 mg total) by mouth 3 (three) times daily as needed for dizziness. 11/18/15   Francoise Ceo, DO  olmesartan-hydrochlorothiazide (BENICAR HCT) 40-25 MG per tablet Take 1 tablet by mouth daily.    Historical Provider, MD  ondansetron (ZOFRAN ODT) 4 MG disintegrating tablet Take 1 tablet (4 mg total) by mouth every 8 (eight) hours as needed for nausea or vomiting. 11/18/15   Francoise Ceo, DO  pantoprazole (PROTONIX) 40 MG tablet Take 40 mg by mouth daily.    Historical Provider, MD    Family History History reviewed. No pertinent family history.  Social History Social History  Substance Use Topics  . Smoking status: Current Every Day Smoker  . Smokeless tobacco: Not on file  . Alcohol use No     Allergies   Oxycodone   Review of Systems Review of Systems  Constitutional: Positive for activity change, appetite change and fatigue. Negative for chills, diaphoresis and fever.  HENT: Negative for congestion, ear pain, facial swelling, hearing loss, rhinorrhea, sinus pain, sinus pressure, sneezing and tinnitus.   Eyes: Positive for visual disturbance. Negative for photophobia and pain.  Respiratory: Negative for chest tightness and shortness of breath.   Cardiovascular: Negative for chest pain.  Gastrointestinal: Positive for nausea and vomiting. Negative for abdominal pain and diarrhea.  Genitourinary: Negative for difficulty urinating and dysuria.  Musculoskeletal: Negative for back pain, neck pain and neck  stiffness.  Skin: Negative for rash and wound.  Neurological: Positive for dizziness and headaches. Negative for tremors, seizures, syncope, facial asymmetry, speech difficulty, weakness, light-headedness and numbness.  All other systems reviewed and are negative.    Physical Exam Updated Vital Signs BP 132/74 (BP Location: Right Arm)   Pulse 68   Temp 98.4 F (36.9 C) (Oral)   Resp 16   Ht 5\' 9"  (1.753 m)   Wt 106.6 kg   SpO2 99%   BMI 34.70 kg/m   Physical Exam  Constitutional: He is oriented to person, place, and time. He appears well-developed and well-nourished. No distress.  HENT:  Head: Normocephalic.  Right Ear: External ear normal.  Left Ear: External ear normal.  Nose: Nose normal.  Mouth/Throat: Oropharynx is clear and moist.  Well healed scar on the left occiput from prior laceration  Eyes: Conjunctivae and EOM are normal. Pupils are equal, round, and reactive to light.  Neck: Normal range of motion. Neck supple.  Cardiovascular: Normal rate, regular rhythm, normal heart sounds and intact distal pulses.   Pulmonary/Chest: Effort normal and breath sounds normal. He exhibits no tenderness.  Abdominal: Soft. He exhibits no distension. There is no tenderness.  Musculoskeletal: He exhibits no edema or tenderness.  Neurological: He is alert and oriented to person, place, and time. He has normal strength. No cranial nerve deficit or sensory deficit. He exhibits normal muscle tone. Coordination and gait normal. GCS eye subscore is 4. GCS verbal subscore is 5. GCS motor subscore is 6.  Intact finger to nose, heel to shin, no pronator drift. No significant visual field cut noted. States that his vision is more blurry in his right eye compared to left, but states that he is not wearing his glasses.    Positive Gilberto Betterix Hallpike on the left.   Skin: Skin is warm and dry. He is not diaphoretic.  Nursing note and vitals reviewed.    ED Treatments / Results  Labs (all labs  ordered are listed, but only abnormal results are displayed) Labs Reviewed - No data to display  EKG  EKG Interpretation None       Radiology Ct Head Wo Contrast  Result Date: 11/18/2015 CLINICAL DATA:  Fall approximately 5 weeks ago. Persistent visual disturbance and dizziness. Subsequent encounter. EXAM: CT HEAD WITHOUT CONTRAST TECHNIQUE: Contiguous axial images were obtained from the base of the skull through the vertex without intravenous contrast. COMPARISON:  10/17/2015 FINDINGS: Brain: No evidence of acute infarction, hemorrhage, hydrocephalus, extra-axial collection or mass lesion/mass effect. Vascular: No hyperdense vessel or unexpected calcification. Skull: Normal. Negative for fracture or focal lesion. Sinuses/Orbits: No acute finding. Other: None. IMPRESSION: Negative unenhanced head CT. Electronically Signed   By: Myles RosenthalJohn  Stahl M.D.   On: 11/18/2015 19:53    Procedures Procedures (including critical care time)  Medications Ordered in ED Medications  meclizine (ANTIVERT) tablet 50 mg (50 mg Oral Given 11/18/15 2154)     Initial Impression / Assessment and Plan / ED Course  I have reviewed the triage vital  signs and the nursing notes.  Pertinent labs & imaging results that were available during my care of the patient were reviewed by me and considered in my medical decision making (see chart for details).  Clinical Course    45 y.o. male presents with postconcussion syndrome with waxing and waning HA, visual symptoms, since a recent head injury in early October. Exam as above consistent with vertigo. He was given a dose of meclizine and gradually had improvement in his sx. CT head was negative. Given duration of sx after head trauma, feel that his sx a due to post-concussive syndrome. Low suspicion for acute CVA, or vertebral artery dissection.  He was rx'd zofran and meclizine for further symptomatic relief and was recommended to follow up with Neurology as an  outpatient. Return precautions were given for worsening or concerns.   Final Clinical Impressions(s) / ED Diagnoses   Final diagnoses:  Post concussion syndrome  Post-concussion vertigo    New Prescriptions Discharge Medication List as of 11/18/2015 11:45 PM    START taking these medications   Details  meclizine (ANTIVERT) 25 MG tablet Take 1 tablet (25 mg total) by mouth 3 (three) times daily as needed for dizziness., Starting Wed 11/18/2015, Print    ondansetron (ZOFRAN ODT) 4 MG disintegrating tablet Take 1 tablet (4 mg total) by mouth every 8 (eight) hours as needed for nausea or vomiting., Starting Wed 11/18/2015, Print         Francoise CeoWarren S Maelyn Berrey, DO 11/20/15 [id number]    Derwood KaplanAnkit Nanavati, MD 11/20/15 2135

## 2015-11-24 ENCOUNTER — Encounter: Payer: Self-pay | Admitting: Neurology

## 2015-11-24 ENCOUNTER — Ambulatory Visit (INDEPENDENT_AMBULATORY_CARE_PROVIDER_SITE_OTHER): Payer: Medicare Other | Admitting: Neurology

## 2015-11-24 VITALS — BP 142/94 | HR 94 | Ht 69.0 in | Wt 238.4 lb

## 2015-11-24 DIAGNOSIS — F0781 Postconcussional syndrome: Secondary | ICD-10-CM

## 2015-11-24 DIAGNOSIS — R413 Other amnesia: Secondary | ICD-10-CM

## 2015-11-24 DIAGNOSIS — S060X0A Concussion without loss of consciousness, initial encounter: Secondary | ICD-10-CM | POA: Diagnosis not present

## 2015-11-24 HISTORY — DX: Concussion without loss of consciousness, initial encounter: S06.0X0A

## 2015-11-24 HISTORY — DX: Postconcussional syndrome: F07.81

## 2015-11-24 NOTE — Patient Instructions (Addendum)
Concussion, Adult ° °A concussion is a brain injury. It is caused by: °A hit to the head. °A quick and sudden movement (jolt) of the head or neck. °A concussion is usually not life threatening. Even so, it can cause serious problems. If you had a concussion before, you may have concussion-like problems after a hit to your head. °Follow these instructions at home: °General instructions °Follow your doctor's directions carefully. °Take medicines only as told by your doctor. °Only take medicines your doctor says are safe. °Do not drink alcohol until your doctor says it is okay. Alcohol and some drugs can slow down healing. They can also put you at risk for further injury. °If you are having trouble remembering things, write them down. °Try to do one thing at a time if you get distracted easily. For example, do not watch TV while making dinner. °Talk to your family members or close friends when making important decisions. °Follow up with your doctor as told. °Watch your symptoms. Tell others to do the same. Serious problems can sometimes happen after a concussion. Older adults are more likely to have these problems. °Tell your teachers, school nurse, school counselor, coach, athletic trainer, or work manager about your concussion. Tell them about what you can or cannot do. They should watch to see if: °It gets even harder for you to pay attention or concentrate. °It gets even harder for you to remember things or learn new things. °You need more time than normal to finish things. °You become annoyed (irritable) more than before. °You are not able to deal with stress as well. °You have more problems than before. °Rest. Make sure you: °Get plenty of sleep at night. °Go to sleep early. °Go to bed at the same time every day. Try to wake up at the same time. °Rest during the day. °Take naps when you feel tired. °Limit activities where you have to think a lot or concentrate. These include: °Doing homework. °Doing work related  to a job. °Watching TV. °Using the computer. °Returning To Your Regular Activities  °Return to your normal activities slowly, not all at once. You must give your body and brain enough time to heal. °Do not play sports or do other athletic activities until your doctor says it is okay. °Ask your doctor when you can drive, ride a bicycle, or work other vehicles or machines. Never do these things if you feel dizzy. °Ask your doctor about when you can return to work or school. °Preventing Another Concussion  °It is very important to avoid another brain injury, especially before you have healed. In rare cases, another injury can lead to permanent brain damage, brain swelling, or death. The risk of this is greatest during the first 7-10 days after your injury. Avoid injuries by: °Wearing a seat belt when riding in a car. °Not drinking too much alcohol. °Avoiding activities that could lead to a second concussion (such as contact sports). °Wearing a helmet when doing activities like: °Biking. °Skiing. °Skateboarding. °Skating. °Making your home safer by: °Removing things from the floor or stairways that could make you trip. °Using grab bars in bathrooms and handrails by stairs. °Placing non-slip mats on floors and in bathtubs. °Improve lighting in dark areas. °Contact a doctor if: °It gets even harder for you to pay attention or concentrate. °It gets even harder for you to remember things or learn new things. °You need more time than normal to finish things. °You become annoyed (irritable) more than before. °You   are not able to deal with stress as well. °You have more problems than before. °You have problems keeping your balance. °You are not able to react quickly when you should. °Get help if you have any of these problems for more than 2 weeks: °Lasting (chronic) headaches. °Dizziness or trouble balancing. °Feeling sick to your stomach (nausea). °Seeing (vision) problems. °Being affected by noises or light more than  normal. °Feeling sad, low, down in the dumps, blue, gloomy, or empty (depressed). °Mood changes (mood swings). °Feeling of fear or nervousness about what may happen (anxiety). °Feeling annoyed. °Memory problems. °Problems concentrating or paying attention. °Sleep problems. °Feeling tired all the time. °Get help right away if: °You have bad headaches or your headaches get worse. °You have weakness (even if it is in one hand, leg, or part of the face). °You have loss of feeling (numbness). °You feel off balance. °You keep throwing up (vomiting). °You feel tired. °One black center of your eye (pupil) is larger than the other. °You twitch or shake violently (convulse). °Your speech is not clear (slurred). °You are more confused, easily angered (agitated), or annoyed than before. °You have more trouble resting than before. °You are unable to recognize people or places. °You have neck pain. °It is difficult to wake you up. °You have unusual behavior changes. °You pass out (lose consciousness). °This information is not intended to replace advice given to you by your health care provider. Make sure you discuss any questions you have with your health care provider. °Document Released: 12/08/2008 Document Revised: 05/28/2015 Document Reviewed: 07/12/2012 °Elsevier Interactive Patient Education © 2017 [employer] °  °

## 2015-11-24 NOTE — Progress Notes (Signed)
[facility] [address] [place]. [place] [id number] [phone]       OFFICE CONSULT NOTE  [name] Date of Birth:  1970-07-13 Medical Record Number:  [id number]   Referring MD:  Juleen ChinaLynley Holt Reason for Referral:   concussion  HPI: Mr. Cameron BergeronKey is a 45 year old Caucasian male who is referred for evaluation for cognitive symptoms following a head injury. Patient fell while he was coming down a tree which he had climbed in his backyard his leg slipped he had a safety harness but he flipped upside down and the back of his head banged into the tree trunk. He had a lot of bleeding he was unable to extricate himself and had to call for help. His wife finally arrived and got him out. He had to go to urgent care where they had to put 7 staples in the back of his scalp. He had a brief loss of consciousness for unknown period of time. Since then patient has been having multifocal symptoms of headache, blurred and tunnel vision and decreased sensation of hot and cold. decreased concentration, decreased focus and delay in processing information and multitasking. His speech has been slow. He feels his pain is not as sharp. He has had difficulty with fine motor skills as well and had trouble signing his name and to 3 weeks ago. He did have some tunnel vision as well. He occasionally gets sharp shooting pain in the back of his left eye when he tries to look to the left. He also states that he has got a temperature perception in his mouth and cannot distinguish between hot and cold sensation. Patient had a CT scan of the head done on 11/18/15 which I personally reviewed and shows no abnormalities but this was done 5 weeks after his initial concussion. He was given meclizine and Zofran which he has tried and has not been helping hence he stopped it He states he has had 2 other concussions in the past. The first one occurred at age 45 when he was working in [employer] site and hit by a  bucket on the side of his head. He did not seek medical help at that time but did have some headache and cognitive symptoms for a week or so. Second concussion according to thousand 8 when a limbal broke from the tree and hit him on the top of his hand. He again did not seek medical at this time. He denies any significant loss of consciousness, witnessed seizure activity or severe headaches. He has no prior history of migraines and seizures  ROS:   14 system review of systems is positive for blurred vision, double vision, eye pain, spinning sensation, confusion, headache, dizziness, anxiety and all other systems negative  PMH:  Past Medical History:  Diagnosis Date  . Bipolar 1 disorder (HCC)   . COPD (chronic obstructive pulmonary disease) (HCC)   . GERD (gastroesophageal reflux disease)   . Headache   . Hypertension   . Schizoaffective disorder (HCC)   . Stroke Cameron Bryan(HCC)     Social History:  Social History   Social History  . Marital status: Single    Spouse name: N/A  . Number of children: N/A  . Years of education: N/A   Occupational History  . Not on file.   Social History Main Topics  . Smoking status: Current Every Day Smoker    Packs/day: 1.00  . Smokeless tobacco: Never Used  . Alcohol use No  .  Drug use: No  . Sexual activity: Not on file   Other Topics Concern  . Not on file   Social History Narrative  . No narrative on file    Medications:   Current Outpatient Prescriptions on File Prior to Visit  Medication Sig Dispense Refill  . amLODipine (NORVASC) 10 MG tablet Take 10 mg by mouth daily.    . meclizine (ANTIVERT) 25 MG tablet Take 1 tablet (25 mg total) by mouth 3 (three) times daily as needed for dizziness. 30 tablet 0  . olmesartan-hydrochlorothiazide (BENICAR HCT) 40-25 MG per tablet Take 1 tablet by mouth daily.    . ondansetron (ZOFRAN ODT) 4 MG disintegrating tablet Take 1 tablet (4 mg total) by mouth every 8 (eight) hours as needed for nausea or  vomiting. 20 tablet 0  . pantoprazole (PROTONIX) 40 MG tablet Take 40 mg by mouth daily.     No current facility-administered medications on file prior to visit.     Allergies:   Allergies  Allergen Reactions  . Oxycodone     Itching    Physical Exam General: well developed, well nourished Obese middle-age Caucasian male, seated, in no evident distress Head: head normocephalic and atraumatic.   Neck: supple with no carotid or supraclavicular bruits Cardiovascular: regular rate and rhythm, no murmurs Musculoskeletal: no deformity Skin:  no rash/petichiae Vascular:  Normal pulses all extremities  Neurologic Exam Mental Status: Awake and fully alert. Oriented to place and time. Recent and remote memory intact. Attention span, concentration and fund of knowledge appropriate. Mood and affect appropriate. Mini-Mental status exam scored 30/30. Recall 3/3. Animal naming 13. Clock drawing 4/4. Cranial Nerves: Fundoscopic exam reveals sharp disc margins. Pupils equal, briskly reactive to light. Extraocular movements full without nystagmus. Visual fields full to confrontation. Hearing intact. Facial sensation intact. Face, tongue, palate moves normally and symmetrically.  Motor: Normal bulk and tone. Normal strength in all tested extremity muscles. Sensory.: intact to touch , pinprick , position and vibratory sensation.  Coordination: Rapid alternating movements normal in all extremities. Finger-to-nose and heel-to-shin performed accurately bilaterally. Gait and Station: Arises from chair without difficulty. Stance is normal. Gait demonstrates normal stride length and balance . Able to heel, toe and tandem walk without difficulty.  Reflexes: 1+ and symmetric. Toes downgoing.       ASSESSMENT: 45 year patient with six-week history of cognitive and memory difficulties following head injury due to postconcussion syndrome. Prior history of 2 other concussions in the past    PLAN: I had a  long discussion the patient and his father regarding his multiple concussions which are probably having a accumulative effect and expect to take him longer to recover from the current one which appears to be a significant sever concussion.. I recommend we check MRI scan of the brain with and without contrast and EEG study. Have advised him not to climb trees and to wear a helmet if his own to be involved in high-risk activities. No specific medications are necessary.I expect gradual improvement over the next few months. Greater than 50% time during this 45 minute consultation was spent on counseling and coordination of care about concussion and answering questions. He will return for follow-up in 2 months or call earlier if necessary Delia HeadyPramod Haddy Mullinax, MD  [facility] [address] [place], [place] [id number]  Phone [phone] Fax [phone] Note: This document was prepared with digital dictation and possible smart phrase technology. Any transcriptional errors that result from this process are unintentional.

## 2015-11-30 ENCOUNTER — Ambulatory Visit (INDEPENDENT_AMBULATORY_CARE_PROVIDER_SITE_OTHER): Payer: Medicare Other

## 2015-11-30 DIAGNOSIS — R413 Other amnesia: Secondary | ICD-10-CM

## 2015-11-30 DIAGNOSIS — R410 Disorientation, unspecified: Secondary | ICD-10-CM

## 2015-11-30 DIAGNOSIS — S060X0A Concussion without loss of consciousness, initial encounter: Secondary | ICD-10-CM

## 2015-12-09 ENCOUNTER — Telehealth: Payer: Self-pay

## 2015-12-09 NOTE — Telephone Encounter (Signed)
-----   Message from Micki RileyPramod S Sethi, MD sent at 12/08/2015  8:42 AM EST ----- Kindly inform the patient that EEG study shows mild slowing of brain wave activity which is likely related to his recent concussion. No seizure activity or any worrisome findings

## 2015-12-09 NOTE — Telephone Encounter (Signed)
Left vm for patient to call back about EEG results.  

## 2015-12-10 ENCOUNTER — Other Ambulatory Visit: Payer: Self-pay

## 2015-12-10 ENCOUNTER — Telehealth: Payer: Self-pay | Admitting: Neurology

## 2015-12-10 NOTE — Telephone Encounter (Signed)
Rn call patient about his EEG results. Rn stated is showed mild slowing of brain activity which is likely related to recent concussion. No seizure activity or worrisome findings. Pt verbalized understanding.

## 2015-12-10 NOTE — Telephone Encounter (Addendum)
Rn call patient back about MRI meds to relax him. Rn ask if he still takes Xanax from the last visit. Pt states he takes 1mg  of xanax twice a day sometimes. Pt states he does not take think it will help him.[facility] for any MRI only. Rn advised patient to take his current xanax pills. Pt verbalized understanding.

## 2015-12-10 NOTE — Telephone Encounter (Signed)
Patient returning a call for EEG results.

## 2015-12-10 NOTE — Telephone Encounter (Deleted)
ww

## 2015-12-10 NOTE — Telephone Encounter (Signed)
Patient is having an MRI on Sunday and needs medication called in to relax him. Please call to [facility] [place].

## 2015-12-20 ENCOUNTER — Ambulatory Visit
Admission: RE | Admit: 2015-12-20 | Discharge: 2015-12-20 | Disposition: A | Discharge status: Home / Self Care | Payer: Medicare Other | Source: Ambulatory Visit | Attending: Neurology | Admitting: Neurology

## 2015-12-20 DIAGNOSIS — S060X0A Concussion without loss of consciousness, initial encounter: Secondary | ICD-10-CM

## 2015-12-20 DIAGNOSIS — R413 Other amnesia: Secondary | ICD-10-CM | POA: Diagnosis not present

## 2015-12-20 MED ORDER — GADOBENATE DIMEGLUMINE 529 MG/ML IV SOLN
20.0000 mL | Freq: Once | INTRAVENOUS | Status: AC | PRN
  Administered 2015-12-20: 20 mL via INTRAVENOUS

## 2016-01-06 ENCOUNTER — Telehealth: Payer: Self-pay

## 2016-01-06 NOTE — Telephone Encounter (Signed)
RN call patient about his MRI scan. Rn stated the MRI was normal. PT verbalized understanding.

## 2016-01-06 NOTE — Telephone Encounter (Signed)
-----   Message from Micki RileyPramod S Sethi, MD sent at 01/01/2016  4:02 PM EST ----- Kindly inform the patient that brain MRI scan study was normal

## 2016-01-14 DIAGNOSIS — Z87820 Personal history of traumatic brain injury: Secondary | ICD-10-CM | POA: Diagnosis not present

## 2016-01-14 DIAGNOSIS — Z1389 Encounter for screening for other disorder: Secondary | ICD-10-CM | POA: Diagnosis not present

## 2016-01-24 DIAGNOSIS — R1110 Vomiting, unspecified: Secondary | ICD-10-CM | POA: Diagnosis not present

## 2016-01-24 DIAGNOSIS — J069 Acute upper respiratory infection, unspecified: Secondary | ICD-10-CM | POA: Diagnosis not present

## 2016-02-01 ENCOUNTER — Ambulatory Visit (INDEPENDENT_AMBULATORY_CARE_PROVIDER_SITE_OTHER): Payer: Medicare Other | Admitting: Neurology

## 2016-02-01 ENCOUNTER — Encounter: Payer: Self-pay | Admitting: Neurology

## 2016-02-01 VITALS — BP 140/95 | HR 86 | Ht 69.0 in | Wt 241.2 lb

## 2016-02-01 DIAGNOSIS — S060X0D Concussion without loss of consciousness, subsequent encounter: Secondary | ICD-10-CM | POA: Diagnosis not present

## 2016-02-01 NOTE — Patient Instructions (Addendum)
I had a long discussion with the patient and his fiance regarding his concussion and persistent mild cognitive and visual difficulties which appear to be slightly improving. I counseled him to use a helmet while working outdoors at all times and to avoid falls and injuries and he voiced understanding I recommend referral to speech therapy for cognitive training. Patient had me fill out accidentally disability paperwork. He will return for follow-up in 6 months or call earlier if necessary.
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
## 2016-02-11 ENCOUNTER — Telehealth: Payer: Self-pay | Admitting: Neurology

## 2016-02-11 ENCOUNTER — Other Ambulatory Visit: Payer: Self-pay

## 2016-02-11 DIAGNOSIS — S060X0A Concussion without loss of consciousness, initial encounter: Secondary | ICD-10-CM

## 2016-02-11 NOTE — Telephone Encounter (Signed)
I have spoke with patient and [facility] to him [facility]  02/15/2016. 12:45 . Patient understood and he will be there.   1.Patient has a question about him going hunting can he do this?  2. Patient wants to no if he needs to ware a helmet all the time ?    I have relayed to Patient Dr. Pearlean BrownieSethi is out of the office .

## 2016-02-11 NOTE — Telephone Encounter (Signed)
Dr. Pearlean BrownieSethi / Katrina , Please re order  SLP to order number [id number] . I spoke to Toniann FailWendy [phone]. Patient has been scheduled for Monday for his swallow study . Order needs to be changed before apt. Apt 02/15/2016 at 1:00 pm. Thanks Annabelle Harmanana

## 2016-02-11 NOTE — Telephone Encounter (Signed)
Called and verified with Toniann FailWendy at [facility] swallow study at [phone] and you have to have Swallow study done first now and then patient will have speech Therapy. Called [facility] to verify as well  [phone].   Katrina Charity fundraiserN has updated all orders.

## 2016-02-11 NOTE — Telephone Encounter (Signed)
Receive message from [place] in referrals that pt needs order for barium swallowing test. Pt also needs order for outpatient speech therapy. The order was redone. Pt already schedule  Per appts.

## 2016-02-11 NOTE — Telephone Encounter (Signed)
Rn spoke with patient about going coon hunting at night time with some friends with a helmet. Rn stated Dr.Sethi would not recommend that at night time due to his history of concussion. Rn stated that would not be safe at this time especially with his vision problems at times. Rn stated his safety is important with no more falls or concussion. Pt stated his fiancee Darl PikesSusan did not want him to hunt. PT verbalized understanding he would follow the doctors advice and medical plan.

## 2016-02-11 NOTE — Telephone Encounter (Signed)
Patient is returning your call.  

## 2016-02-11 NOTE — Telephone Encounter (Signed)
Thanks. Agree with plan as stated 

## 2016-02-11 NOTE — Telephone Encounter (Signed)
Message sent to Dana in referrals. 

## 2016-02-12 ENCOUNTER — Other Ambulatory Visit (HOSPITAL_COMMUNITY): Payer: Self-pay | Admitting: Neurology

## 2016-02-12 DIAGNOSIS — R1319 Other dysphagia: Secondary | ICD-10-CM

## 2016-02-15 ENCOUNTER — Ambulatory Visit (HOSPITAL_COMMUNITY)
Admission: RE | Admit: 2016-02-15 | Discharge: 2016-02-15 | Disposition: A | Discharge status: Home / Self Care | Payer: Medicare Other | Source: Ambulatory Visit | Attending: Neurology | Admitting: Neurology

## 2016-02-15 DIAGNOSIS — R1319 Other dysphagia: Secondary | ICD-10-CM | POA: Insufficient documentation

## 2016-02-15 DIAGNOSIS — F319 Bipolar disorder, unspecified: Secondary | ICD-10-CM | POA: Diagnosis not present

## 2016-02-15 DIAGNOSIS — I10 Essential (primary) hypertension: Secondary | ICD-10-CM | POA: Insufficient documentation

## 2016-02-15 DIAGNOSIS — Z8673 Personal history of transient ischemic attack (TIA), and cerebral infarction without residual deficits: Secondary | ICD-10-CM | POA: Insufficient documentation

## 2016-02-15 DIAGNOSIS — F259 Schizoaffective disorder, unspecified: Secondary | ICD-10-CM | POA: Insufficient documentation

## 2016-02-15 DIAGNOSIS — S060X0A Concussion without loss of consciousness, initial encounter: Secondary | ICD-10-CM

## 2016-02-15 DIAGNOSIS — R51 Headache: Secondary | ICD-10-CM | POA: Insufficient documentation

## 2016-02-15 DIAGNOSIS — J449 Chronic obstructive pulmonary disease, unspecified: Secondary | ICD-10-CM | POA: Diagnosis not present

## 2016-02-15 DIAGNOSIS — K219 Gastro-esophageal reflux disease without esophagitis: Secondary | ICD-10-CM | POA: Insufficient documentation

## 2016-02-19 ENCOUNTER — Ambulatory Visit: Payer: Medicare Other | Admitting: *Deleted

## 2016-02-22 DIAGNOSIS — I10 Essential (primary) hypertension: Secondary | ICD-10-CM | POA: Diagnosis not present

## 2016-02-22 DIAGNOSIS — R110 Nausea: Secondary | ICD-10-CM | POA: Diagnosis not present

## 2016-02-22 DIAGNOSIS — E559 Vitamin D deficiency, unspecified: Secondary | ICD-10-CM | POA: Diagnosis not present

## 2016-02-23 ENCOUNTER — Telehealth: Payer: Self-pay

## 2016-02-23 NOTE — Telephone Encounter (Signed)
Rn call patient about the swallowing test.Rn stated it was normal.Pt verbalized understanding. Pt stated he had to cancel the speech therapy session because his wife is going to have back surgery soon. He cannot afford the 55.00 copayment that would cost him every time. Pt stated he will practice on his speech and is oing fine. Pt stated he is on a fix income. Rn stated Dr.Sethi will be notified he cannot afford the co payment for the speech.

## 2016-02-23 NOTE — Telephone Encounter (Signed)
FYI Dr.Sethi pt cannot afford the speech therapy at this time.

## 2016-02-23 NOTE — Telephone Encounter (Signed)
-----   Message from Micki RileyPramod S Sethi, MD sent at 02/20/2016 10:10 PM EST ----- Inform the patient that swallow study was normal

## 2016-02-23 NOTE — Telephone Encounter (Signed)
ok 

## 2016-02-25 IMAGING — CT CT HEAD W/O CM
1 series · 16 of 30 positions shown, 20 images · non-contrast
Comparison: 02/03/2014

CLINICAL DATA: Headache started on [REDACTED], chest pain starting on
[REDACTED]. Blurred vision and pain in the eyes. Nausea and vomiting.

EXAM:
CT HEAD WITHOUT CONTRAST
TECHNIQUE: Contiguous axial images were obtained from the base of the skull
through the vertex without intravenous contrast.

[Series 2: head 5.0 h30s · axial · 0.47mm/px · z∈[-136,+14]mm · 16 of 34 slices shown, 20 images]
[im 2/34  brain]
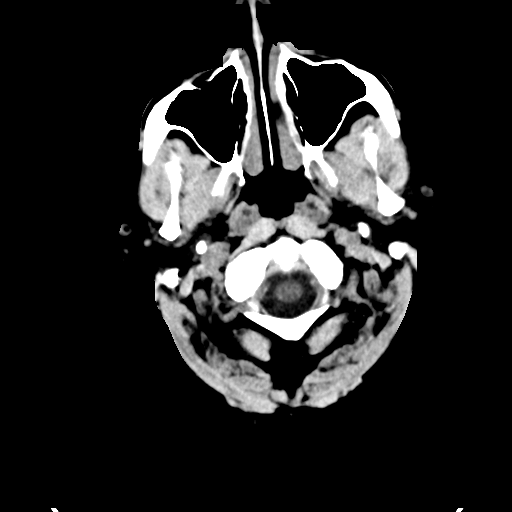
[im 2/34  bone]
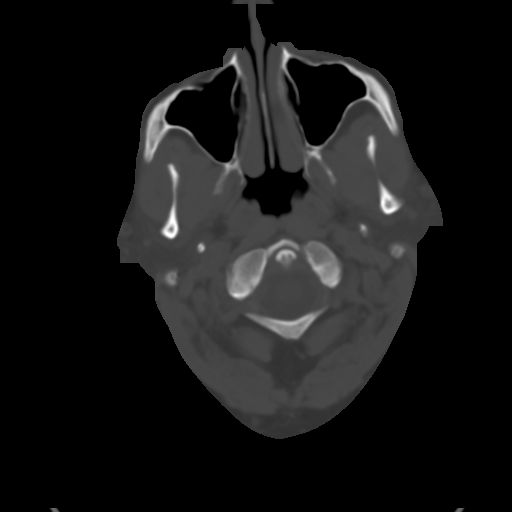
[im 4/34  brain]
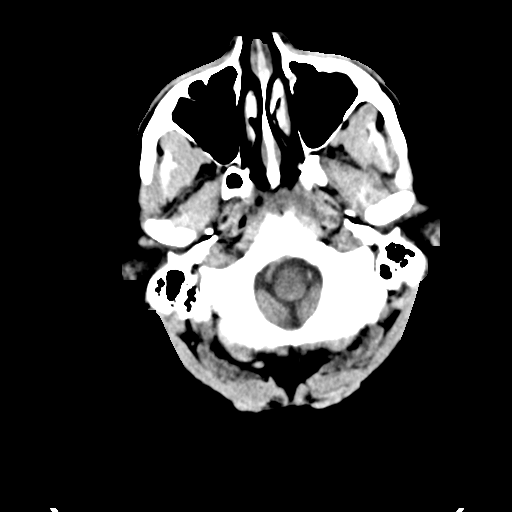
[im 6/34  brain]
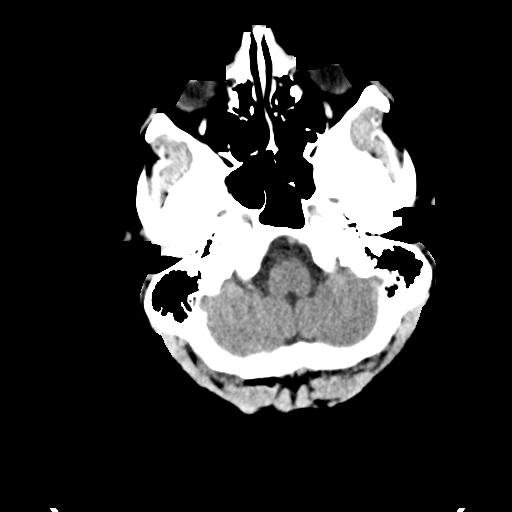
[im 8/34  brain]
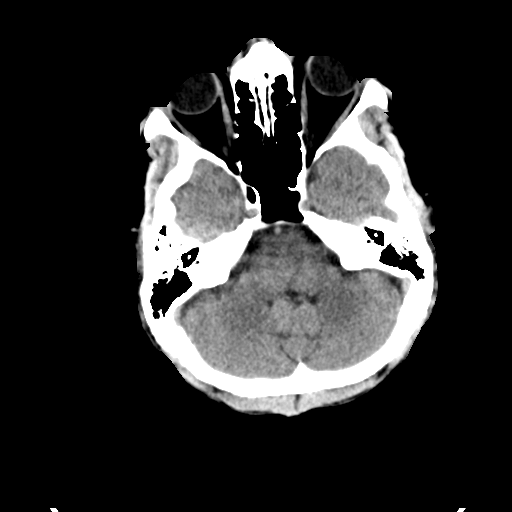
[im 10/34  brain]
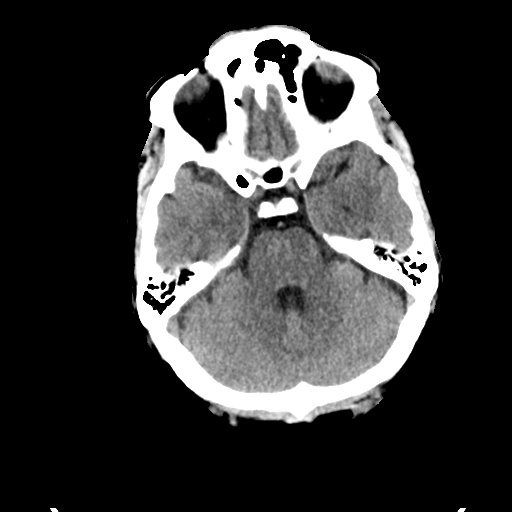
[im 10/34  bone]
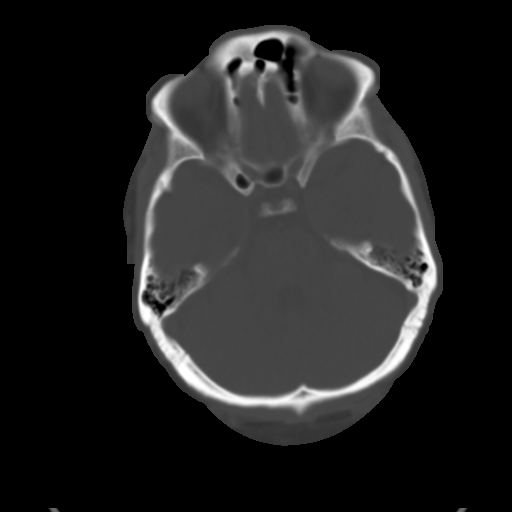
[im 12/34  brain]
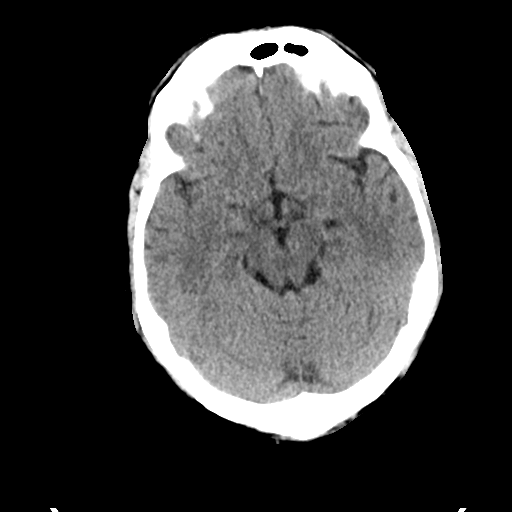
[im 14/34  brain]
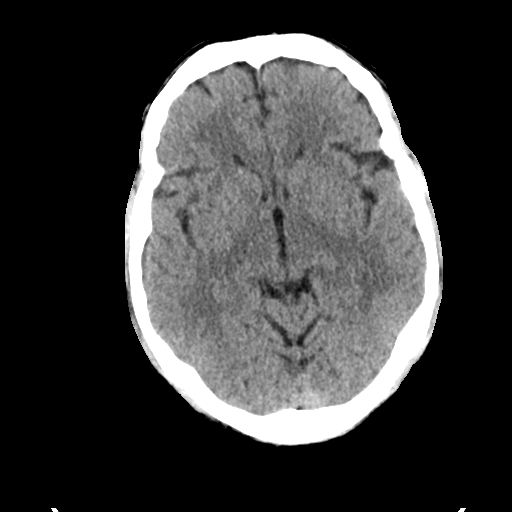
[im 16/34  brain]
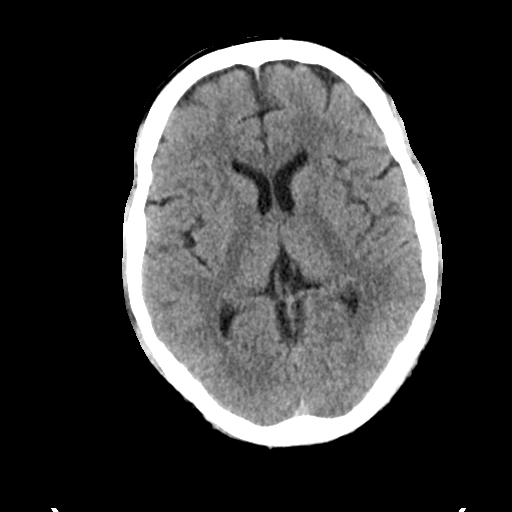
[im 18/34  brain]
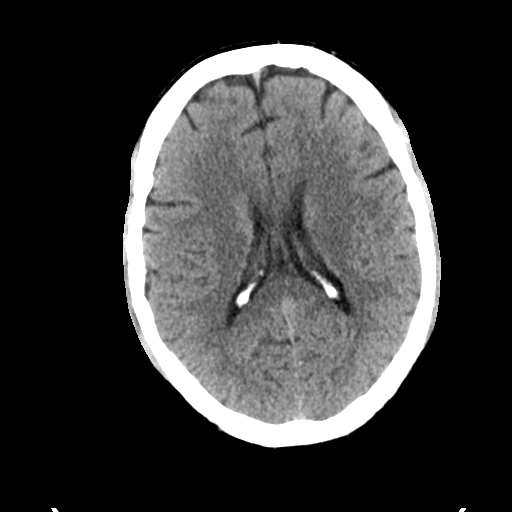
[im 18/34  bone]
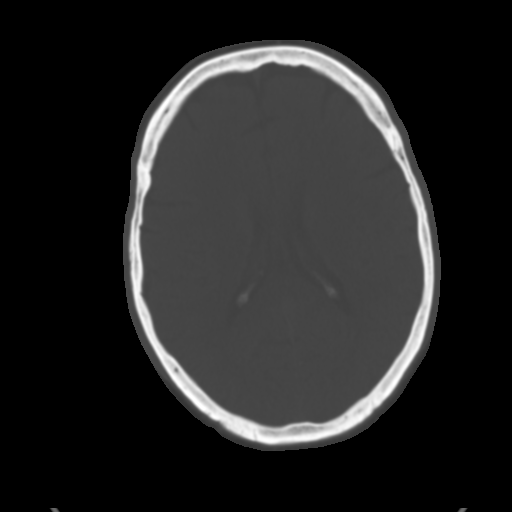
[im 20/34  brain]
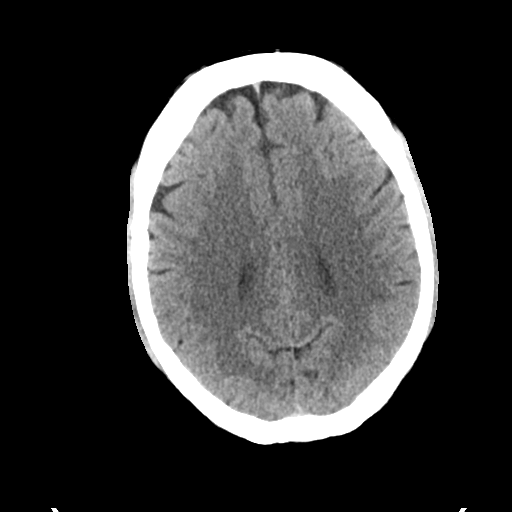
[im 22/34  brain]
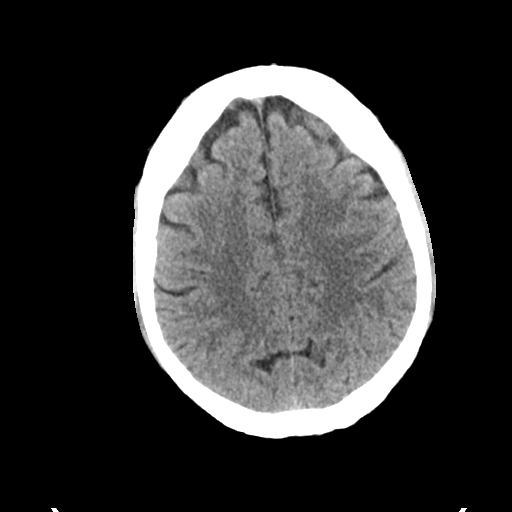
[im 24/34  brain]
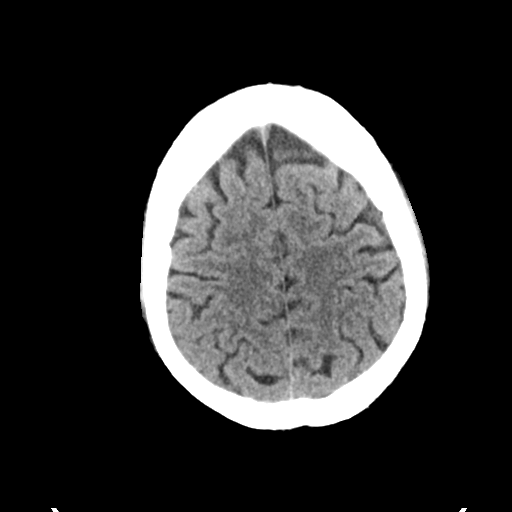
[im 26/34  brain]
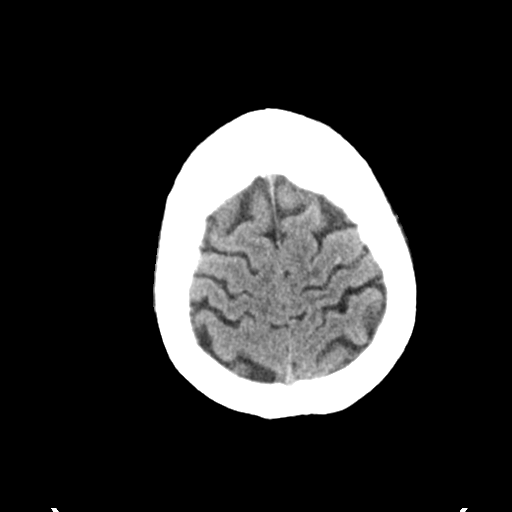
[im 26/34  bone]
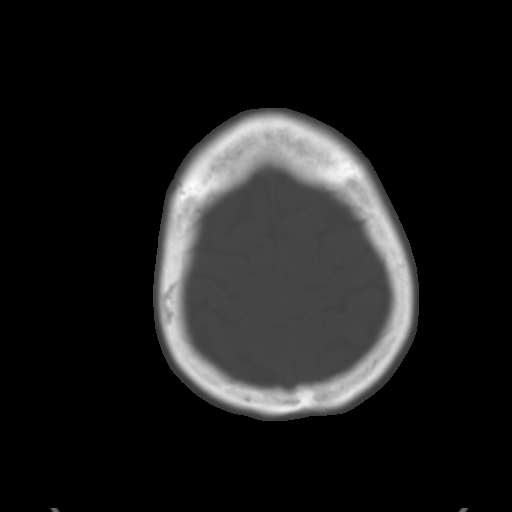
[im 28/34  brain]
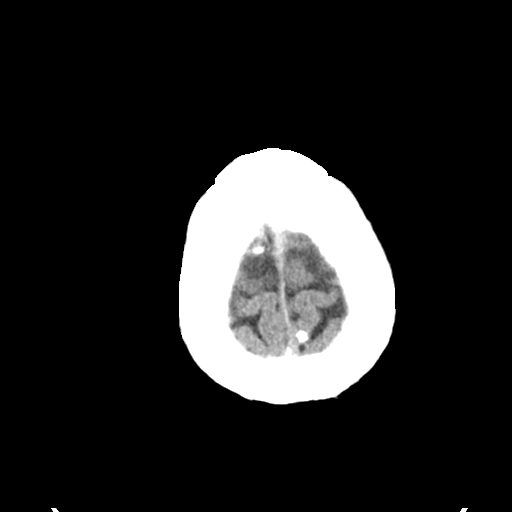
[im 30/34  brain]
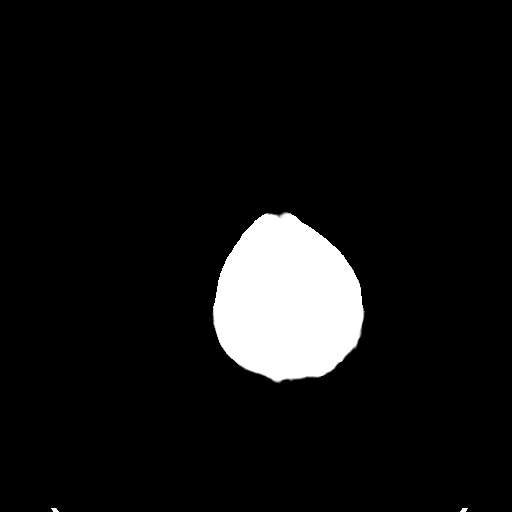
[im 32/34  brain]
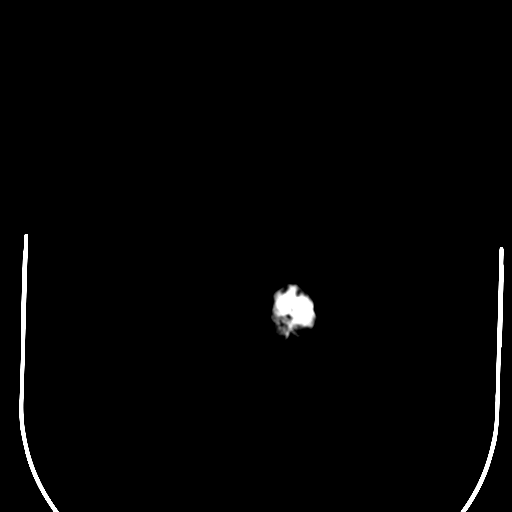

[16 of 30 positions shown; findings below may reference images not displayed]

FINDINGS: Mild chronic anterior ethmoid sinusitis. No orbital abnormality
identified.

The brainstem, cerebellum, cerebral peduncles, thalamus, basal
ganglia, basilar cisterns, and ventricular system appear within
normal limits. No intracranial hemorrhage, mass lesion, or acute
CVA.
IMPRESSION: 1. No significant intracranial abnormality identified.
2. Mild chronic anterior ethmoid sinusitis.

## 2016-02-25 IMAGING — MR MR HEAD W/O CM
9 of 10 series · 38 of 48 positions shown · non-contrast
Comparison: Prior CT from earlier the same day.

CLINICAL DATA: Initial evaluation for acute headache, blurry
vision.

EXAM:
MRI HEAD WITHOUT CONTRAST
TECHNIQUE: Multiplanar, multiecho pulse sequences of the brain and surrounding
structures were obtained without intravenous contrast.

[Series 3: DWI · axial · 3.0mm · 1.09mm/px · z∈[-112,+34]mm · 11 of 102 slices shown (1 of 4)]
[im 1/102]
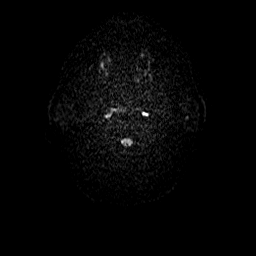
[im 11/102]
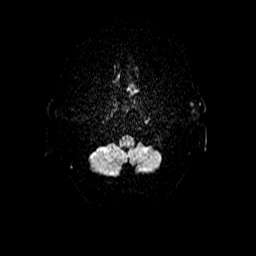
[im 21/102]
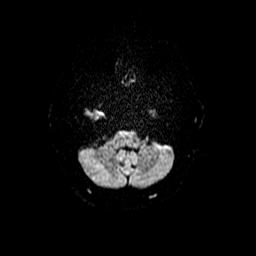
[im 31/102]
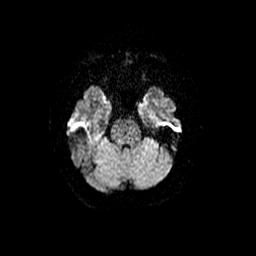
[im 41/102]
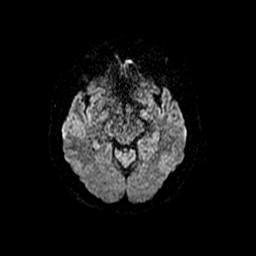
[im 51/102]
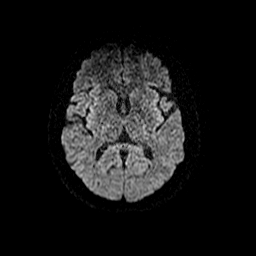
[im 61/102]
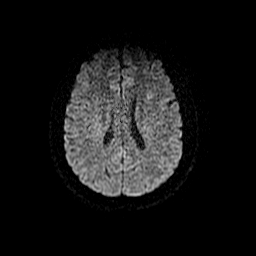
[im 71/102]
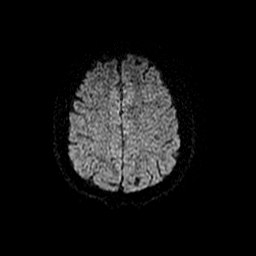
[im 81/102]
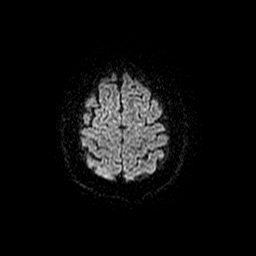
[im 91/102]
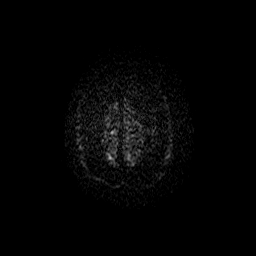
[im 102/102]
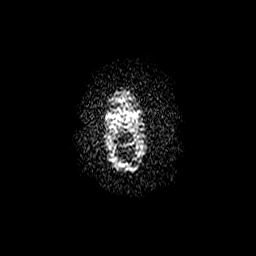

[Series 4: DWI · coronal · 5.0mm · 1.09mm/px · 7 of 76 slices shown (2 of 4)]
[im 1/76]
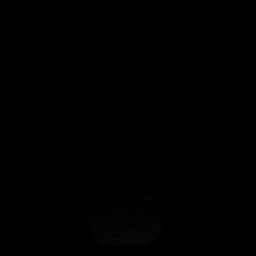
[im 13/76]
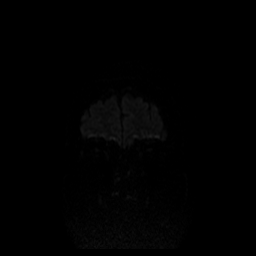
[im 26/76]
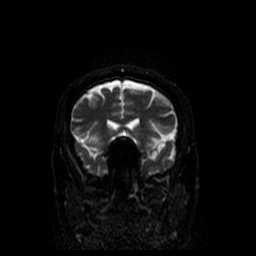
[im 38/76]
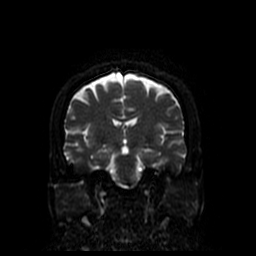
[im 51/76]
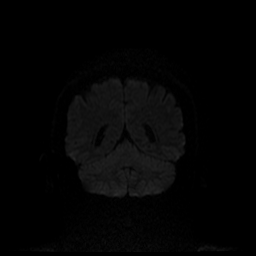
[im 63/76]
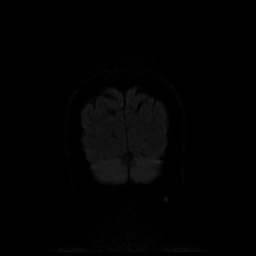
[im 76/76]
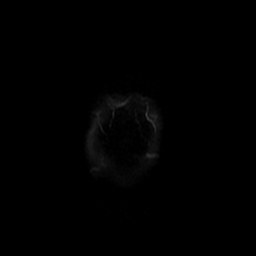

[Series 5: T1 · sagittal · 5.0mm · 0.47mm/px · 2 of 25 slices shown]
[im 1/25]
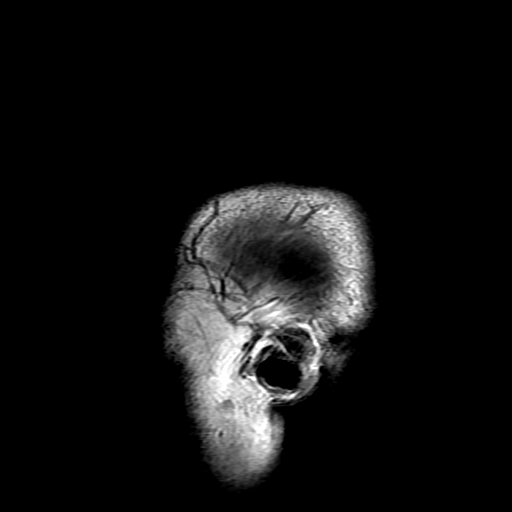
[im 25/25]
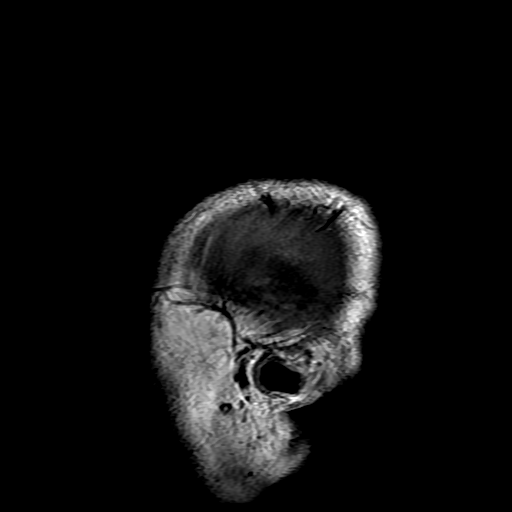

[Series 7: FLAIR · axial · 5.0mm · 0.43mm/px · z∈[-111,+35]mm · 2 of 26 slices shown]
[im 1/26]
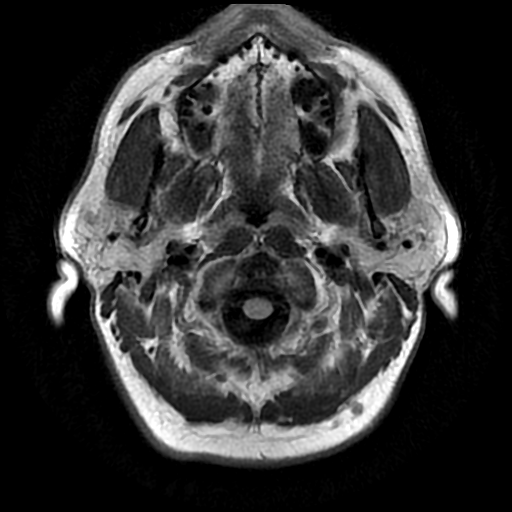
[im 26/26]
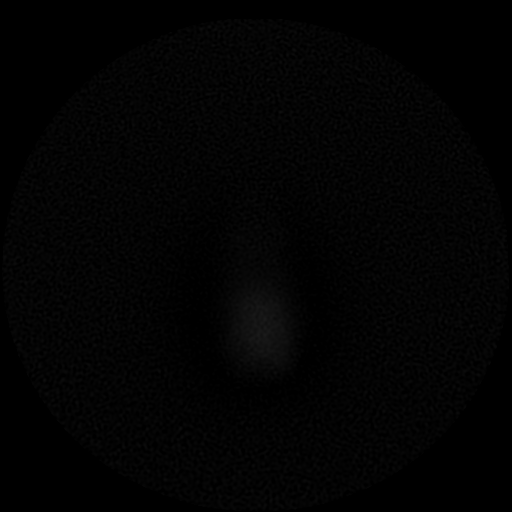

[Series 8: T2 · axial · 5.0mm · 0.43mm/px · z∈[-111,+35]mm · 2 of 26 slices shown (1 of 2)]
[im 1/26]
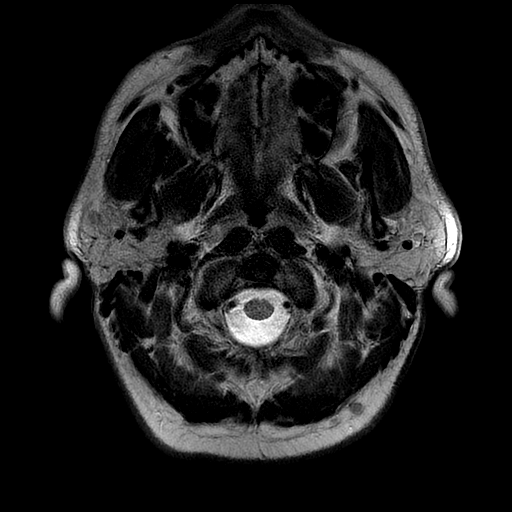
[im 26/26]
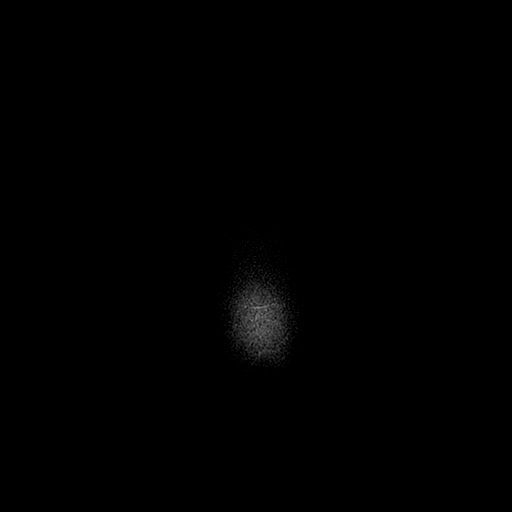

[Series 9: ax mpgr · axial · 5.0mm · 0.43mm/px · z∈[-111,+35]mm · 2 of 26 slices shown]
[im 1/26]
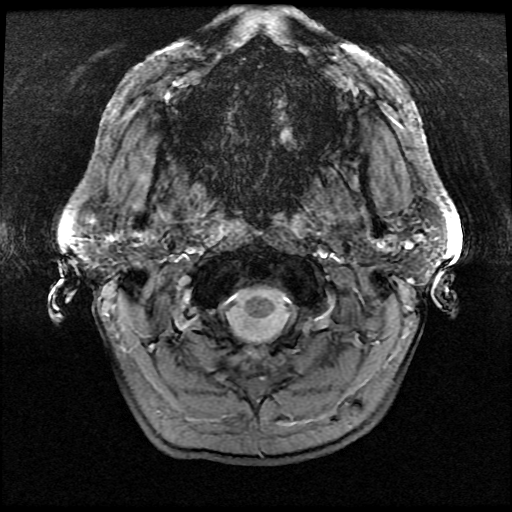
[im 26/26]
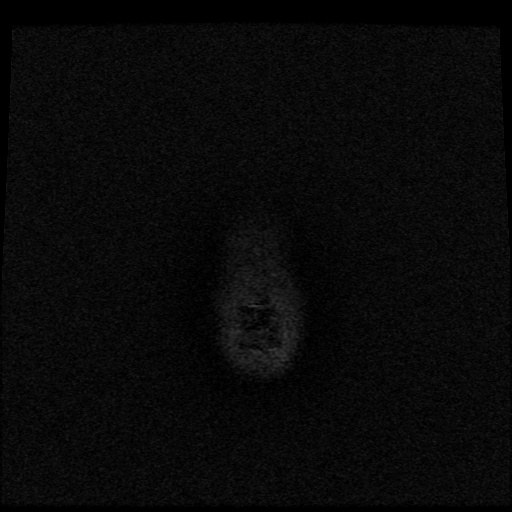

[Series 10: T2 · coronal · 5.0mm · 0.43mm/px · 3 of 32 slices shown (2 of 2)]
[im 1/32]
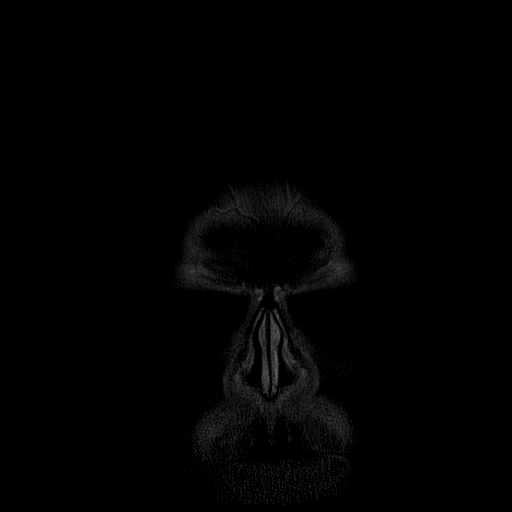
[im 16/32]
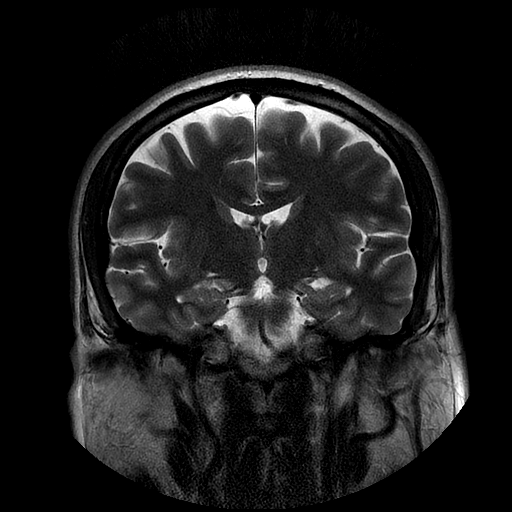
[im 32/32]
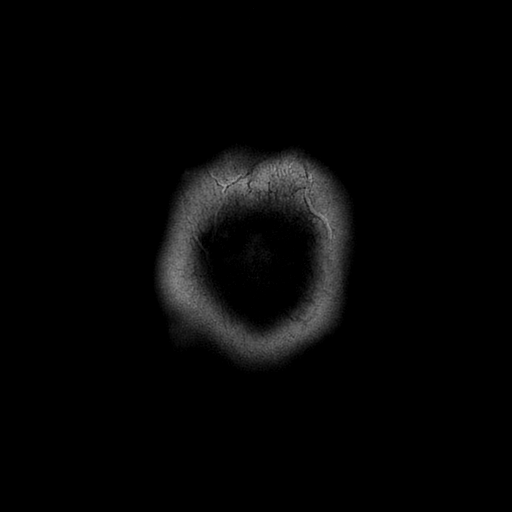

[Series 300: DWI · axial · 3.0mm · 1.09mm/px · z∈[-112,+34]mm · 5 of 51 slices shown (3 of 4)]
[im 1/51]
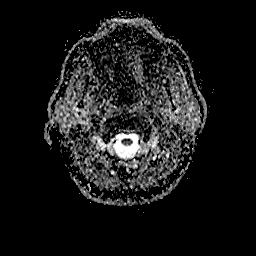
[im 13/51]
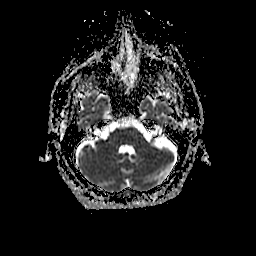
[im 26/51]
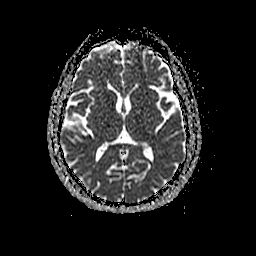
[im 38/51]
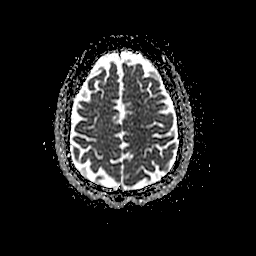
[im 51/51]
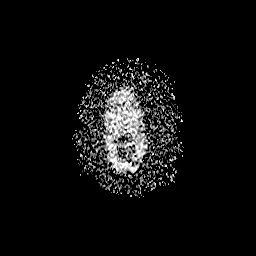

[Series 400: DWI · coronal · 5.0mm · 1.09mm/px · 4 of 38 slices shown (4 of 4)]
[im 1/38]
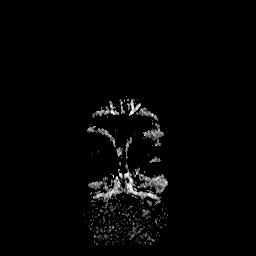
[im 13/38]
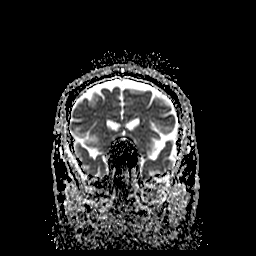
[im 25/38]
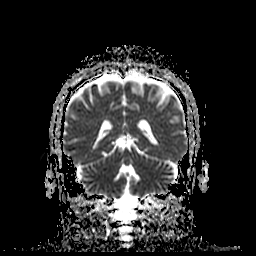
[im 38/38]
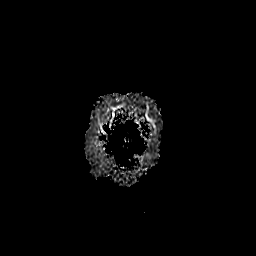

[38 of 48 positions shown; findings below may reference images not displayed]

FINDINGS: The CSF containing spaces are within normal limits for patient age.
No focal parenchymal signal abnormality is identified. No mass
lesion, midline shift, or extra-axial fluid collection. Ventricles
are normal in size without evidence of hydrocephalus.

No diffusion-weighted signal abnormality is identified to suggest
acute intracranial infarct. Gray-white matter differentiation is
maintained. Normal flow voids are seen within the intracranial
vasculature. No intracranial hemorrhage identified.

The cervicomedullary junction is normal. Pituitary gland is within
normal limits. Pituitary stalk is midline. The globes and optic
nerves demonstrate a normal appearance with normal signal intensity.

The bone marrow signal intensity is normal. Calvarium is intact.
Visualized upper cervical spine is within normal limits.

Scalp soft tissues are unremarkable.

Paranasal sinuses are clear.  No mastoid effusion.
IMPRESSION: Normal MRI of the brain with no acute intracranial process
identified.

## 2016-04-12 DIAGNOSIS — J0101 Acute recurrent maxillary sinusitis: Secondary | ICD-10-CM | POA: Diagnosis not present

## 2016-04-26 ENCOUNTER — Telehealth: Payer: Self-pay | Admitting: Neurology

## 2016-04-26 NOTE — Telephone Encounter (Signed)
Rn call patient about vision not approve. Also he is unable to read. Pt states he has been stress a lot and cant sleep at night. Rn stated all his test were normal except the EEG showed mild slowing of the brain activity likely related to concussion. Rn stated DR .Sethi reviewed his chart. The recommendations from him are :  1. See his eye doctor for his vision issues  2. Make an appt with his PCP for him not be able to sleep. Pt does take xanax daily.  Pt verbalized understanding.

## 2016-04-26 NOTE — Telephone Encounter (Signed)
Patient called office states his condition has not improved, but has gotten worse.  Patient states his vision have became bad over the last 5-6 weeks unable to read.  Patient states he is unable to sleep at night.  I have rescheduled the patients appointment to be seen sooner for 07/07/16 with Dr. Pearlean Brownie.

## 2016-05-02 DIAGNOSIS — H539 Unspecified visual disturbance: Secondary | ICD-10-CM | POA: Diagnosis not present

## 2016-05-03 DIAGNOSIS — H47619 Cortical blindness, unspecified side of brain: Secondary | ICD-10-CM | POA: Diagnosis not present

## 2016-05-03 DIAGNOSIS — H5213 Myopia, bilateral: Secondary | ICD-10-CM | POA: Diagnosis not present

## 2016-05-26 DIAGNOSIS — H5340 Unspecified visual field defects: Secondary | ICD-10-CM | POA: Diagnosis not present

## 2016-05-26 DIAGNOSIS — H47619 Cortical blindness, unspecified side of brain: Secondary | ICD-10-CM | POA: Diagnosis not present

## 2016-06-07 DIAGNOSIS — T63421A Toxic effect of venom of ants, accidental (unintentional), initial encounter: Secondary | ICD-10-CM | POA: Diagnosis not present

## 2016-07-05 DIAGNOSIS — H5340 Unspecified visual field defects: Secondary | ICD-10-CM | POA: Diagnosis not present

## 2016-07-05 DIAGNOSIS — H47619 Cortical blindness, unspecified side of brain: Secondary | ICD-10-CM | POA: Diagnosis not present

## 2016-07-07 ENCOUNTER — Ambulatory Visit: Payer: Medicare Other | Admitting: Neurology

## 2016-07-27 DIAGNOSIS — H5310 Unspecified subjective visual disturbances: Secondary | ICD-10-CM | POA: Diagnosis not present

## 2016-07-27 DIAGNOSIS — H47619 Cortical blindness, unspecified side of brain: Secondary | ICD-10-CM | POA: Diagnosis not present

## 2016-07-27 DIAGNOSIS — H5340 Unspecified visual field defects: Secondary | ICD-10-CM | POA: Diagnosis not present

## 2016-08-01 ENCOUNTER — Ambulatory Visit: Payer: Medicare Other | Admitting: Neurology

## 2016-08-04 DIAGNOSIS — H5340 Unspecified visual field defects: Secondary | ICD-10-CM | POA: Diagnosis not present

## 2016-08-08 DIAGNOSIS — Z0000 Encounter for general adult medical examination without abnormal findings: Secondary | ICD-10-CM | POA: Diagnosis not present

## 2016-08-08 DIAGNOSIS — K219 Gastro-esophageal reflux disease without esophagitis: Secondary | ICD-10-CM | POA: Diagnosis not present

## 2016-08-08 DIAGNOSIS — Z79899 Other long term (current) drug therapy: Secondary | ICD-10-CM | POA: Diagnosis not present

## 2016-08-08 DIAGNOSIS — I10 Essential (primary) hypertension: Secondary | ICD-10-CM | POA: Diagnosis not present

## 2016-12-07 DIAGNOSIS — J40 Bronchitis, not specified as acute or chronic: Secondary | ICD-10-CM | POA: Diagnosis not present

## 2016-12-07 DIAGNOSIS — J329 Chronic sinusitis, unspecified: Secondary | ICD-10-CM | POA: Diagnosis not present

## 2016-12-28 DIAGNOSIS — J0100 Acute maxillary sinusitis, unspecified: Secondary | ICD-10-CM | POA: Diagnosis not present

## 2017-02-06 DIAGNOSIS — K219 Gastro-esophageal reflux disease without esophagitis: Secondary | ICD-10-CM | POA: Diagnosis not present

## 2017-02-06 DIAGNOSIS — Z79899 Other long term (current) drug therapy: Secondary | ICD-10-CM | POA: Diagnosis not present

## 2017-02-06 DIAGNOSIS — Z0000 Encounter for general adult medical examination without abnormal findings: Secondary | ICD-10-CM | POA: Diagnosis not present

## 2017-02-06 DIAGNOSIS — Z1331 Encounter for screening for depression: Secondary | ICD-10-CM | POA: Diagnosis not present

## 2017-05-16 DIAGNOSIS — S40869A Insect bite (nonvenomous) of unspecified upper arm, initial encounter: Secondary | ICD-10-CM | POA: Diagnosis not present

## 2017-05-16 DIAGNOSIS — W57XXXA Bitten or stung by nonvenomous insect and other nonvenomous arthropods, initial encounter: Secondary | ICD-10-CM | POA: Diagnosis not present

## 2017-05-16 DIAGNOSIS — L089 Local infection of the skin and subcutaneous tissue, unspecified: Secondary | ICD-10-CM | POA: Diagnosis not present

## 2017-06-26 DIAGNOSIS — Z720 Tobacco use: Secondary | ICD-10-CM | POA: Diagnosis not present

## 2017-06-26 DIAGNOSIS — M2550 Pain in unspecified joint: Secondary | ICD-10-CM | POA: Diagnosis not present

## 2017-06-26 DIAGNOSIS — R5383 Other fatigue: Secondary | ICD-10-CM | POA: Diagnosis not present

## 2017-06-26 DIAGNOSIS — R5381 Other malaise: Secondary | ICD-10-CM | POA: Diagnosis not present

## 2017-06-26 DIAGNOSIS — M791 Myalgia, unspecified site: Secondary | ICD-10-CM | POA: Diagnosis not present

## 2017-07-04 DIAGNOSIS — W57XXXA Bitten or stung by nonvenomous insect and other nonvenomous arthropods, initial encounter: Secondary | ICD-10-CM | POA: Diagnosis not present

## 2017-07-04 DIAGNOSIS — B359 Dermatophytosis, unspecified: Secondary | ICD-10-CM | POA: Diagnosis not present

## 2017-07-04 DIAGNOSIS — M2550 Pain in unspecified joint: Secondary | ICD-10-CM | POA: Diagnosis not present

## 2017-08-09 DIAGNOSIS — K5900 Constipation, unspecified: Secondary | ICD-10-CM | POA: Diagnosis not present

## 2017-08-09 DIAGNOSIS — K219 Gastro-esophageal reflux disease without esophagitis: Secondary | ICD-10-CM | POA: Diagnosis not present

## 2017-08-09 DIAGNOSIS — Z79899 Other long term (current) drug therapy: Secondary | ICD-10-CM | POA: Diagnosis not present

## 2017-08-09 DIAGNOSIS — I10 Essential (primary) hypertension: Secondary | ICD-10-CM | POA: Diagnosis not present

## 2017-11-04 DIAGNOSIS — J209 Acute bronchitis, unspecified: Secondary | ICD-10-CM | POA: Diagnosis not present

## 2017-11-04 DIAGNOSIS — R509 Fever, unspecified: Secondary | ICD-10-CM | POA: Diagnosis not present

## 2017-11-04 DIAGNOSIS — J069 Acute upper respiratory infection, unspecified: Secondary | ICD-10-CM | POA: Diagnosis not present

## 2018-01-13 DIAGNOSIS — M545 Low back pain: Secondary | ICD-10-CM | POA: Diagnosis not present

## 2018-01-13 DIAGNOSIS — K4090 Unilateral inguinal hernia, without obstruction or gangrene, not specified as recurrent: Secondary | ICD-10-CM | POA: Diagnosis not present

## 2018-01-13 DIAGNOSIS — R109 Unspecified abdominal pain: Secondary | ICD-10-CM | POA: Diagnosis not present

## 2018-01-13 DIAGNOSIS — R072 Precordial pain: Secondary | ICD-10-CM | POA: Diagnosis not present

## 2018-01-13 DIAGNOSIS — E876 Hypokalemia: Secondary | ICD-10-CM | POA: Diagnosis not present

## 2018-01-13 DIAGNOSIS — E871 Hypo-osmolality and hyponatremia: Secondary | ICD-10-CM | POA: Diagnosis not present

## 2018-01-13 DIAGNOSIS — I491 Atrial premature depolarization: Secondary | ICD-10-CM | POA: Diagnosis not present

## 2018-01-13 DIAGNOSIS — R079 Chest pain, unspecified: Secondary | ICD-10-CM | POA: Diagnosis not present

## 2018-01-14 DIAGNOSIS — I491 Atrial premature depolarization: Secondary | ICD-10-CM | POA: Diagnosis not present

## 2018-01-14 DIAGNOSIS — R079 Chest pain, unspecified: Secondary | ICD-10-CM | POA: Diagnosis not present

## 2018-01-15 DIAGNOSIS — R809 Proteinuria, unspecified: Secondary | ICD-10-CM | POA: Diagnosis not present

## 2018-01-22 DIAGNOSIS — M545 Low back pain: Secondary | ICD-10-CM | POA: Diagnosis not present

## 2018-01-22 DIAGNOSIS — N3000 Acute cystitis without hematuria: Secondary | ICD-10-CM | POA: Diagnosis not present

## 2018-02-19 DIAGNOSIS — I10 Essential (primary) hypertension: Secondary | ICD-10-CM | POA: Diagnosis not present

## 2018-05-10 DIAGNOSIS — R509 Fever, unspecified: Secondary | ICD-10-CM | POA: Diagnosis not present

## 2018-05-10 DIAGNOSIS — R29898 Other symptoms and signs involving the musculoskeletal system: Secondary | ICD-10-CM | POA: Diagnosis not present

## 2018-05-17 ENCOUNTER — Telehealth: Payer: Self-pay

## 2018-05-17 NOTE — Telephone Encounter (Signed)
I called pt to put his email in the visit for Monday. He verified email was [email or web address]. He receive the email for link when appt was made.

## 2018-05-21 ENCOUNTER — Other Ambulatory Visit: Payer: Self-pay

## 2018-05-21 ENCOUNTER — Ambulatory Visit (INDEPENDENT_AMBULATORY_CARE_PROVIDER_SITE_OTHER): Payer: Medicare Other | Admitting: Neurology

## 2018-05-21 ENCOUNTER — Encounter: Payer: Self-pay | Admitting: Neurology

## 2018-05-21 ENCOUNTER — Telehealth: Payer: Self-pay | Admitting: Neurology

## 2018-05-21 DIAGNOSIS — I6300 Cerebral infarction due to thrombosis of unspecified precerebral artery: Secondary | ICD-10-CM

## 2018-05-21 MED ORDER — CLOPIDOGREL BISULFATE 75 MG PO TABS: 75.0000 mg | ORAL_TABLET | Freq: Every day | ORAL | 11 refills | Status: DC

## 2018-05-21 MED ORDER — ROSUVASTATIN CALCIUM 5 MG PO TABS: 2.5000 mg | ORAL_TABLET | Freq: Every day | ORAL | 11 refills | Status: DC
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
## 2018-05-21 NOTE — Telephone Encounter (Signed)
[employer] Medicare/medicaid order sent to [facility]. No auth they will reach out to the pt to schedule.  °

## 2018-05-25 DIAGNOSIS — R7309 Other abnormal glucose: Secondary | ICD-10-CM | POA: Diagnosis not present

## 2018-05-25 DIAGNOSIS — E785 Hyperlipidemia, unspecified: Secondary | ICD-10-CM | POA: Diagnosis not present

## 2018-05-29 ENCOUNTER — Other Ambulatory Visit: Payer: Self-pay

## 2018-05-29 MED ORDER — ROSUVASTATIN CALCIUM 5 MG PO TABS: 2.5000 mg | ORAL_TABLET | Freq: Every day | ORAL | 3 refills | Status: DC

## 2018-06-23 ENCOUNTER — Ambulatory Visit
Admission: RE | Admit: 2018-06-23 | Discharge: 2018-06-23 | Disposition: A | Discharge status: Home / Self Care | Payer: Medicare Other | Source: Ambulatory Visit | Attending: Neurology | Admitting: Neurology

## 2018-06-23 ENCOUNTER — Other Ambulatory Visit: Payer: Self-pay

## 2018-06-23 DIAGNOSIS — I6300 Cerebral infarction due to thrombosis of unspecified precerebral artery: Secondary | ICD-10-CM

## 2018-06-23 MED ORDER — GADOBENATE DIMEGLUMINE 529 MG/ML IV SOLN
20.0000 mL | Freq: Once | INTRAVENOUS | Status: AC | PRN
  Administered 2018-06-23: 20 mL via INTRAVENOUS

## 2018-06-26 DIAGNOSIS — L259 Unspecified contact dermatitis, unspecified cause: Secondary | ICD-10-CM | POA: Diagnosis not present

## 2018-07-03 DIAGNOSIS — R609 Edema, unspecified: Secondary | ICD-10-CM | POA: Diagnosis not present

## 2018-07-03 DIAGNOSIS — I10 Essential (primary) hypertension: Secondary | ICD-10-CM | POA: Diagnosis not present

## 2018-07-04 ENCOUNTER — Telehealth: Payer: Self-pay

## 2018-07-04 NOTE — Telephone Encounter (Signed)
MR brain, MRA head, MRA neck results fax twice to pts primary doctor [facility]. Fax twice and confirmed.

## 2018-07-04 NOTE — Telephone Encounter (Signed)
Notes recorded by Marval Regal, RN on 07/04/2018 at 10:19 AM EDT  I called pt to give results of all MR head, Mr neck brain, and MR neck results. He gave phone to his wife Manuela Schwartz to give results.I stated Kindly inform the patient that MRI scan of the brain shows minor changes of hardening of the arteries which are quite common and not a worrisome finding. MRA of the neck and MRA of the brain both do not show significant blockages of the major blood vessels in the neck of the brain. Nothing to worry about. SHe verbalized understanding.

## 2018-07-04 NOTE — Telephone Encounter (Signed)
I gave wife results of scans. She stated pt is in bed a lot due to swelling in his legs. She stated the plavix and crestor cause a rash and swelling in his legs. Pt is no longer taking the medication. SHe stated pt is taking a fluid pill for his legs. ALso pt is suppose to get a EKG. I advise the wife to call PCP swelling in his legs get bad and Dr.Sethi will be made aware of pt not taking crestor or plavix. The wife verbalized understanding.

## 2018-07-04 NOTE — Telephone Encounter (Signed)
I would like him to take aspirin 81 mg instead of plavix and  a in person office visit in few weeks after he has seen primary MD first to discuss this further

## 2018-07-04 NOTE — Telephone Encounter (Signed)
-----   Message from Garvin Fila, MD sent at 06/28/2018  3:53 PM EDT ----- Cameron Bryan inform the patient that MRI scan of the brain shows minor changes of hardening of the arteries which are quite common and not a worrisome finding.  MRA of the neck and MRA of the brain both do not show significant blockages of the major blood vessels in the neck of the brain.  Nothing to worry about.

## 2018-07-04 NOTE — Telephone Encounter (Signed)
I called pts wife Manuela Schwartz that DR.Leonie Man wants pt to take aspirin instead of plavix. Also to discuss with PCP about cholesterol management. I stated all the images report was fax to PCP.She verbalized understanding.

## 2018-07-18 ENCOUNTER — Other Ambulatory Visit: Payer: Self-pay

## 2018-07-18 ENCOUNTER — Ambulatory Visit (INDEPENDENT_AMBULATORY_CARE_PROVIDER_SITE_OTHER): Payer: Medicare Other | Admitting: Neurology

## 2018-07-18 ENCOUNTER — Encounter: Payer: Self-pay | Admitting: Neurology

## 2018-07-18 VITALS — BP 169/108 | HR 72 | Temp 98.4°F | Wt 215.0 lb

## 2018-07-18 DIAGNOSIS — R29898 Other symptoms and signs involving the musculoskeletal system: Secondary | ICD-10-CM | POA: Diagnosis not present

## 2018-07-18 DIAGNOSIS — I6300 Cerebral infarction due to thrombosis of unspecified precerebral artery: Secondary | ICD-10-CM | POA: Diagnosis not present

## 2018-07-18 NOTE — Patient Instructions (Signed)
I had a long discussion with the patient and his wife regarding his right hand weakness and discuss results of MRI scan and answered questions.  I agree with continuing aspirin 325 mg daily as he has developed a rash on Plavix.  Recommend aggressive risk factor modification.  Check hemoglobin A1c and lipid profile today.  Continue red yeast rice as he has developed statin myalgias.  He was advised to keep his scheduled appointment with his cardiologist next week to discuss his leg swelling.  No routine scheduled follow-up neurological appointment is necessary but he may return back as needed.
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
## 2018-07-19 ENCOUNTER — Other Ambulatory Visit: Payer: Self-pay

## 2018-07-19 LAB — LIPID PANEL
Chol/HDL Ratio: 4.7 ratio (ref 0.0–5.0)
Cholesterol, Total: 186 mg/dL (ref 100–199)
HDL: 40 mg/dL (ref >39)
LDL Calculated: 108 mg/dL — ABNORMAL HIGH (ref 0–99)
Triglycerides: 191 mg/dL — ABNORMAL HIGH (ref 0–149)
VLDL Cholesterol Cal: 38 mg/dL (ref 5–40)

## 2018-07-19 LAB — HEMOGLOBIN A1C
Est. average glucose Bld gHb Est-mCnc: 103 mg/dL
Hgb A1c MFr Bld: 5.2 % (ref 4.8–5.6)

## 2018-07-19 NOTE — Patient Outreach (Signed)
[facility]) Care Management  07/19/2018  Cameron Bryan [birth year] [id number]   Medication Adherence call to [name] Hippa Identifiers Verify spoke with patient he is past due on Rosuvastatin 5 mg patient explain he is no longer taking this medication because of side effects he is now taking lasix. Cameron Bryan is showing past due under [place].   [facility] Direct Dial [phone]  Fax [phone] [email or web address]

## 2018-07-20 ENCOUNTER — Other Ambulatory Visit (HOSPITAL_COMMUNITY): Payer: Self-pay | Admitting: Neurology

## 2018-07-20 MED ORDER — ROSUVASTATIN CALCIUM 5 MG PO TABS: 5.0000 mg | ORAL_TABLET | Freq: Every day | ORAL | 11 refills | Status: DC

## 2018-07-23 ENCOUNTER — Ambulatory Visit (INDEPENDENT_AMBULATORY_CARE_PROVIDER_SITE_OTHER): Payer: Medicare Other | Admitting: Cardiology

## 2018-07-23 ENCOUNTER — Encounter: Payer: Self-pay | Admitting: Cardiology

## 2018-07-23 ENCOUNTER — Other Ambulatory Visit: Payer: Self-pay

## 2018-07-23 VITALS — BP 172/86 | HR 82 | Ht 69.0 in | Wt 216.0 lb

## 2018-07-23 DIAGNOSIS — R600 Localized edema: Secondary | ICD-10-CM

## 2018-07-23 DIAGNOSIS — F17210 Nicotine dependence, cigarettes, uncomplicated: Secondary | ICD-10-CM | POA: Insufficient documentation

## 2018-07-23 DIAGNOSIS — I10 Essential (primary) hypertension: Secondary | ICD-10-CM

## 2018-07-23 DIAGNOSIS — R0789 Other chest pain: Secondary | ICD-10-CM

## 2018-07-23 DIAGNOSIS — R079 Chest pain, unspecified: Secondary | ICD-10-CM | POA: Diagnosis not present

## 2018-07-23 DIAGNOSIS — R011 Cardiac murmur, unspecified: Secondary | ICD-10-CM

## 2018-07-23 HISTORY — DX: Nicotine dependence, cigarettes, uncomplicated: F17.210

## 2018-07-23 HISTORY — DX: Localized edema: R60.0

## 2018-07-23 HISTORY — DX: Other chest pain: R07.89

## 2018-07-23 NOTE — Addendum Note (Signed)
Addended by: Beckey Rutter on: 07/23/2018 04:59 PM   Modules accepted: Orders

## 2018-07-23 NOTE — Progress Notes (Signed)
Cardiology Office Note:    Date:  07/23/2018   ID:  Cameron PrimerDavid S Tammen, DOB [birth year], MRN [id number]  PCP:  Marylen PontoHolt, Lynley S, MD  Cardiologist:  Garwin Brothersajan R Maecyn Panning, MD   Referring MD: Lise AuerKhan, Jaber A, MD    ASSESSMENT:    1. Chest tightness   2. Essential hypertension   3. Cigarette smoker   4. Pedal edema    PLAN:    In order of problems listed above:  1. Chest tightness: Dyspnea on exertion:.  Patient has multiple risk factors for coronary artery disease.  In view of this following recommendations were made to the patient.  Sublingual nitroglycerin prescription was sent, its protocol and 911 protocol explained and the patient vocalized understanding questions were answered to the patient's satisfaction.  Lexiscan sestamibi will also be done to assess the symptoms. 2. Essential hypertension: His blood pressure stable.  Echocardiogram will be done to assess murmur heard on auscultation. 3. Cigarette smoking: I spent 5 minutes with the patient discussing solely about smoking. Smoking cessation was counseled. I suggested to the patient also different medications and pharmacological interventions. Patient is keen to try stopping on its own at this time. He will get back to me if he needs any further assistance in this matter. 4. Patient will be seen in follow-up appointment in 6 months or earlier if the patient has any concerns 5. Patient's pedal edema has pictures of him showing me that his left leg was swollen more than the right.  Today there is no pedal edema.  This happened recently.  In view of this I would like to get a DVT study.   Medication Adjustments/Labs and Tests Ordered: Current medicines are reviewed at length with the patient today.  Concerns regarding medicines are outlined above.  No orders of the defined types were placed in this encounter.  No orders of the defined types were placed in this encounter.    History of Present Illness:    Cameron Bryan is a 48 y.o. male who  is being seen today for the evaluation of chest tightness and pedal edema at the request of Lise AuerKhan, Jaber A, MD.  Patient is a pleasant 48 year old male.  His wife was on the phone when I had this evaluation.  Patient has history of essential hypertension.  He mentions to me that he has a history of stroke and CVA and TIAs.  [place] and I reviewed those records.  The patient mentions to me that he occasionally has chest tightness.  Sometimes this radiates to the neck.  No orthopnea or PND.  This does not occur with exertion or sexual activity.  At the time of my evaluation, the patient is alert awake oriented and in no distress.  He also mentions to me that he has some shortness of breath on exertion.  He also mentions to me that his left leg was swollen more than the right.  Today it is fine.  He showed me pictures on his cell phone about this.  Past Medical History:  Diagnosis Date  . Bipolar 1 disorder (HCC)   . Common migraine   . COPD (chronic obstructive pulmonary disease) (HCC)   . Depression   . GERD (gastroesophageal reflux disease)   . Headache   . Hypertension   . 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HCC)   . Schizoaffective disorder (HCC)   . Stroke [facility](HCC)     History reviewed. No pertinent surgical history.  Current Medications: Current  Meds  Medication Sig  . ALPRAZolam (XANAX) 1 MG tablet 2 (two) times daily.   Marland Kitchen. amLODipine (NORVASC) 10 MG tablet Take 10 mg by mouth daily.  Marland Kitchen. aspirin 325 MG tablet Take 325 mg by mouth daily.  . furosemide (LASIX) 20 MG tablet Take 20 mg by mouth every other day.   . meloxicam (MOBIC) 15 MG tablet Take 15 mg by mouth daily.   Marland Kitchen. olmesartan-hydrochlorothiazide (BENICAR HCT) 40-25 MG per tablet Take 1 tablet by mouth daily.  . ondansetron (ZOFRAN ODT) 4 MG disintegrating tablet Take 1 tablet (4 mg total) by mouth every 8 (eight) hours as needed for nausea or vomiting.  . pantoprazole (PROTONIX) 40 MG tablet Take 40 mg by mouth  daily.   . vitamin B-12 (CYANOCOBALAMIN) 1000 MCG tablet Take 1,000 mcg by mouth daily.     Allergies:   Chantix [varenicline tartrate], Kenalog [triamcinolone acetonide], Lipitor [atorvastatin calcium], Oxycodone, Prednisone, and Valtrex [valacyclovir hcl]   Social History   Socioeconomic History  . Marital status: Single    Spouse name: Not on file  . Number of children: Not on file  . Years of education: Not on file  . Highest education level: Not on file  Occupational History  . Not on file  Social Needs  . Financial resource strain: Not on file  . Food insecurity    Worry: Not on file    Inability: Not on file  . Transportation needs    Medical: Not on file    Non-medical: Not on file  Tobacco Use  . Smoking status: Current Every Day Smoker    Packs/day: 1.00  . Smokeless tobacco: Never Used  Substance and Sexual Activity  . Alcohol use: No  . Drug use: No  . Sexual activity: Not on file  Lifestyle  . Physical activity    Days per week: Not on file    Minutes per session: Not on file  . Stress: Not on file  Relationships  . Social Musicianconnections    Talks on phone: Not on file    Gets together: Not on file    Attends religious service: Not on file    Active member of club or organization: Not on file    Attends meetings of clubs or organizations: Not on file    Relationship status: Not on file  Other Topics Concern  . Not on file  Social History Narrative  . Not on file     Family History: The patient's family history includes Alzheimer's disease in his paternal grandfather.  ROS:   Please see the history of present illness.    All other systems reviewed and are negative.  EKGs/Labs/Other Studies Reviewed:    The following studies were reviewed today: EKG reveals sinus rhythm and nonspecific ST-T changes.   Recent Labs: No results found for requested labs within last 8760 hours.  Recent Lipid Panel    Component Value Date/Time   CHOL 186  07/18/2018 1456   TRIG 191 (H) 07/18/2018 1456   HDL 40 07/18/2018 1456   CHOLHDL 4.7 07/18/2018 1456   LDLCALC 108 (H) 07/18/2018 1456    Physical Exam:    VS:  BP (!) 172/86 (BP Location: Left Arm, Patient Position: Sitting, Cuff Size: Normal)   Pulse 82   Ht 5\' 9"  (1.753 m)   Wt 216 lb (98 kg)   SpO2 98%   BMI 31.90 kg/m     Wt Readings from Last 3 Encounters:  07/23/18 216 lb (  98 kg)  07/18/18 215 lb (97.5 kg)  02/01/16 241 lb 3.2 oz (109.4 kg)     GEN: Patient is in no acute distress HEENT: Normal NECK: No JVD; No carotid bruits LYMPHATICS: No lymphadenopathy CARDIAC: S1 S2 regular, 2/6 systolic murmur at the apex. RESPIRATORY:  Clear to auscultation without rales, wheezing or rhonchi  ABDOMEN: Soft, non-tender, non-distended MUSCULOSKELETAL:  No edema; No deformity  SKIN: Warm and dry NEUROLOGIC:  Alert and oriented x 3 PSYCHIATRIC:  Normal affect    Signed, Jenean Lindau, MD  07/23/2018 3:16 PM    [place]

## 2018-07-23 NOTE — Patient Instructions (Addendum)
Medication Instructions:  Your physician has recommended you make the following change in your medication:  START taking nitroglycerin as needed for chest tightness When having chest pain, stop what you are doing and sit down. Take 1 nitro, wait 5 minutes. Still having chest pain, take 1 nitro, wait 5 minutes. Still having chest pain, take 1 nitro, dial 911. Total of 3 nitro in 15 minutes.  If you need a refill on your cardiac medications before your next appointment, please call your pharmacy.   Lab work: NONE If you have labs (blood work) drawn today and your tests are completely normal, you will receive your results only by:  MyChart Message (if you have MyChart) OR  A paper copy in the mail If you have any lab test that is abnormal or we need to change your treatment, we will call you to review the results.  Testing/Procedures: Your physician has requested that you have an echocardiogram. Echocardiography is a painless test that uses sound waves to create images of your heart. It provides your doctor with information about the size and shape of your heart and how well your hearts chambers and valves are working. This procedure takes approximately one hour. There are no restrictions for this procedure.  Your physician has requested that you have a lexiscan myoview. For further information please visit [email or web address]. Please follow instruction sheet, as given.  Your physician has requested that you have a lower or upper extremity venous duplex. This test is an ultrasound of the veins in the legs or arms. It looks at venous blood flow that carries blood from the heart to the legs or arms. Allow one hour for a Lower Venous exam. Allow thirty minutes for an Upper Venous exam. There are no restrictions or special instructions.      [facility] [address] [place], [place] [address] Phone:  [phone]  July 23, 2018    Idolina PrimerDavid S Lank DOB:  1970/08/12 MRN: [id number] [address]  [place] [address]   Dear Mr. Caffie DammeKeye,  You are scheduled for a Myocardial Perfusion Imaging Study on:  Aug 09, 2018 at 11:30.  Please arrive 15 minutes prior to your appointment time for registration and insurance purposes.  The test will take approximately 3 to 4 hours to complete; you may bring reading material.  If someone comes with you to your appointment, they will need to remain in the main lobby due to limited space in the testing area. **If you are pregnant or breastfeeding, please notify the nuclear lab prior to your appointment**  How to prepare for your Myocardial Perfusion Test:  Do not eat or drink 3 hours prior to your test, except you may have water.  Do not consume products containing caffeine (regular or decaffeinated) 12 hours prior to your test. (ex: coffee, chocolate, sodas, tea). Do bring a list of your current medications with you.  If not listed below, you may take your medications as normal.  Do not take olmesartan/HCTZ and furosemide prior to the test.  Bring the medication to your appointment as you may be required to take it once the test is complete.  Do wear comfortable clothes (no dresses or overalls) and walking shoes, tennis shoes preferred (No heels or open toe shoes are allowed).  Do NOT wear cologne, perfume, aftershave, or lotions (deodorant is allowed).  If these instructions are not followed, your test will have to be rescheduled.  Please report to [address] for  your test.  If you have questions or concerns about your appointment, you can call the [facility] at [phone].  If you cannot keep your appointment, please provide 24 hours notification to the Nuclear Lab, to avoid a possible $50 charge to your account.    Follow-Up: At [facility], you and your health needs are our priority.  As part of our continuing mission to provide you with exceptional heart  care, we have created designated Provider Care Teams.  These Care Teams include your primary Cardiologist (physician) and Advanced Practice Providers (APPs -  Physician Assistants and Nurse Practitioners) who all work together to provide you with the care you need, when you need it. You will need a follow up appointment in 3 months.    Any Other Special Instructions Will Be Listed Below   Echocardiogram An echocardiogram is a procedure that uses painless sound waves (ultrasound) to produce an image of the heart. Images from an echocardiogram can provide important information about:  Signs of coronary artery disease (CAD).  Aneurysm detection. An aneurysm is a weak or damaged part of an artery wall that bulges out from the normal force of blood pumping through the body.  Heart size and shape. Changes in the size or shape of the heart can be associated with certain conditions, including heart failure, aneurysm, and CAD.  Heart muscle function.  Heart valve function.  Signs of a past heart attack.  Fluid buildup around the heart.  Thickening of the heart muscle.  A tumor or infectious growth around the heart valves. Tell a health care provider about:  Any allergies you have.  All medicines you are taking, including vitamins, herbs, eye drops, creams, and over-the-counter medicines.  Any blood disorders you have.  Any surgeries you have had.  Any medical conditions you have.  Whether you are pregnant or may be pregnant. What are the risks? Generally, this is a safe procedure. However, problems may occur, including:  Allergic reaction to dye (contrast) that may be used during the procedure. What happens before the procedure? No specific preparation is needed. You may eat and drink normally. What happens during the procedure?   An IV tube may be inserted into one of your veins.  You may receive contrast through this tube. A contrast is an injection that improves the  quality of the pictures from your heart.  A gel will be applied to your chest.  A wand-like tool (transducer) will be moved over your chest. The gel will help to transmit the sound waves from the transducer.  The sound waves will harmlessly bounce off of your heart to allow the heart images to be captured in real-time motion. The images will be recorded on a computer. The procedure may vary among health care providers and hospitals. What happens after the procedure?  You may return to your normal, everyday life, including diet, activities, and medicines, unless your health care provider tells you not to do that. Summary  An echocardiogram is a procedure that uses painless sound waves (ultrasound) to produce an image of the heart.  Images from an echocardiogram can provide important information about the size and shape of your heart, heart muscle function, heart valve function, and fluid buildup around your heart.  You do not need to do anything to prepare before this procedure. You may eat and drink normally.  After the echocardiogram is completed, you may return to your normal, everyday life, unless your health care provider  tells you not to do that. This information is not intended to replace advice given to you by your health care provider. Make sure you discuss any questions you have with your health care provider. Document Released: 12/18/1999 Document Revised: 04/12/2018 Document Reviewed: 01/23/2016 Elsevier Patient Education  2020 Elsevier Inc.   Deep Vein Thrombosis  Deep vein thrombosis (DVT) is a condition in which a blood clot forms in a deep vein, such as a lower leg, thigh, or arm vein. A clot is blood that has thickened into a gel or solid. This condition is dangerous. It can lead to serious and even life-threatening complications if the clot travels to the lungs and causes a blockage (pulmonary embolism). It can also damage veins in the leg. This can result in leg pain,  swelling, discoloration, and sores (post-thrombotic syndrome). What are the causes? This condition may be caused by:  A slowdown of blood flow.  Damage to a vein.  A condition that causes blood to clot more easily, such as an inherited clotting disorder. What increases the risk? The following factors may make you more likely to develop this condition:  Being overweight.  Being older, especially over age 47.  Sitting or lying down for more than four hours.  Being in the hospital.  Lack of physical activity (sedentary lifestyle).  Pregnancy, being in childbirth, or having recently given birth.  Taking medicines that contain estrogen, such as medicines to prevent pregnancy.  Smoking.  A history of any of the following: ? Blood clots or a blood clotting disease. ? Peripheral vascular disease. ? Inflammatory bowel disease. ? Cancer. ? Heart disease. ? Genetic conditions that affect how your blood clots, such as Factor V Leiden mutation. ? Neurological diseases that affect your legs (leg paresis). ? A recent injury, such as a car accident. ? Major or lengthy surgery. ? A central line placed inside a large vein. What are the signs or symptoms? Symptoms of this condition include:  Swelling, pain, or tenderness in an arm or leg.  Warmth, redness, or discoloration in an arm or leg. If the clot is in your leg, symptoms may be more noticeable or worse when you stand or walk. Some people may not develop any symptoms. How is this diagnosed? This condition is diagnosed with:  A medical history and physical exam.  Tests, such as: ? Blood tests. These are done to check how well your blood clots. ? Ultrasound. This is done to check for clots. ? Venogram. For this test, contrast dye is injected into a vein and X-rays are taken to check for any clots. How is this treated? Treatment for this condition depends on:  The cause of your DVT.  Your risk for bleeding or developing  more clots.  Any other medical conditions that you have. Treatment may include:  Taking a blood thinner (anticoagulant). This type of medicine prevents clots from forming. It may be taken by mouth, injected under the skin, or injected through an IV (catheter).  Injecting clot-dissolving medicines into the affected vein (catheter-directed thrombolysis).  Having surgery. Surgery may be done to: ? Remove the clot. ? Place a filter in a large vein to catch blood clots before they reach the lungs. Some treatments may be continued for up to six months. Follow these instructions at home: If you are taking blood thinners:  Take the medicine exactly as told by your health care provider. Some blood thinners need to be taken at the same time every day. Do  not skip a dose.  Talk with your health care provider before you take any medicines that contain aspirin or NSAIDs. These medicines increase your risk for dangerous bleeding.  Ask your health care provider about foods and drugs that could change the way the medicine works (may interact). Avoid those things if your health care provider tells you to do so.  Blood thinners can cause easy bruising and may make it difficult to stop bleeding. Because of this: ? Be very careful when using knives, scissors, or other sharp objects. ? Use an electric razor instead of a blade. ? Avoid activities that could cause injury or bruising, and follow instructions about how to prevent falls.  Wear a medical alert bracelet or carry a card that lists what medicines you take. General instructions  Take over-the-counter and prescription medicines only as told by your health care provider.  Return to your normal activities as told by your health care provider. Ask your health care provider what activities are safe for you.  Wear compression stockings if recommended by your health care provider.  Keep all follow-up visits as told by your health care provider. This  is important. How is this prevented? To lower your risk of developing this condition again:  For 30 or more minutes every day, do an activity that: ? Involves moving your arms and legs. ? Increases your heart rate.  When traveling for longer than four hours: ? Exercise your arms and legs every hour. ? Drink plenty of water. ? Avoid drinking alcohol.  Avoid sitting or lying for a long time without moving your legs.  If you have surgery or you are hospitalized, ask about ways to prevent blood clots. These may include taking frequent walks or using anticoagulants.  Stay at a healthy weight.  If you are a woman who is older than age 59, avoid unnecessary use of medicines that contain estrogen, such as some birth control pills.  Do not use any products that contain nicotine or tobacco, such as cigarettes and e-cigarettes. This is especially important if you take estrogen medicines. If you need help quitting, ask your health care provider. Contact a health care provider if:  You miss a dose of your blood thinner.  Your menstrual period is heavier than usual.  You have unusual bruising. Get help right away if:  You have: ? New or increased pain, swelling, or redness in an arm or leg. ? Numbness or tingling in an arm or leg. ? Shortness of breath. ? Chest pain. ? A rapid or irregular heartbeat. ? A severe headache or confusion. ? A cut that will not stop bleeding.  There is blood in your vomit, stool, or urine.  You have a serious fall or accident, or you hit your head.  You feel light-headed or dizzy.  You cough up blood. These symptoms may represent a serious problem that is an emergency. Do not wait to see if the symptoms will go away. Get medical help right away. Call your local emergency services (911 in the [place] Do not drive yourself to the hospital. Summary  Deep vein thrombosis (DVT) is a condition in which a blood clot forms in a deep vein, such as a lower leg,  thigh, or arm vein.  Symptoms can include swelling, warmth, pain, and redness in your leg or arm.  This condition may be treated with a blood thinner (anticoagulant medicine), medicine that is injected to dissolve blood clots,compression stockings, or surgery.  If you are  prescribed blood thinners, take them exactly as told. This information is not intended to replace advice given to you by your health care provider. Make sure you discuss any questions you have with your health care provider. Document Released: 12/20/2004 Document Revised: 12/02/2016 Document Reviewed: 05/20/2016 Elsevier Patient Education  [place] injection What is this medicine? REGADENOSON is used to test the heart for coronary artery disease. It is used in patients who can not exercise for their stress test. This medicine may be used for other purposes; ask your health care provider or pharmacist if you have questions. COMMON BRAND NAME(S): Lexiscan What should I tell my health care provider before I take this medicine? They need to know if you have any of these conditions:  heart problems  lung or breathing disease, like asthma or COPD  an unusual or allergic reaction to regadenoson, other medicines, foods, dyes, or preservatives  pregnant or trying to get pregnant  breast-feeding How should I use this medicine? This medicine is for injection into a vein. It is given by a health care professional in a hospital or clinic setting. Talk to your pediatrician regarding the use of this medicine in children. Special care may be needed. Overdosage: If you think you have taken too much of this medicine contact a poison control center or emergency room at once. NOTE: This medicine is only for you. Do not share this medicine with others. What if I miss a dose? This does not apply. What may interact with this medicine?  caffeine  dipyridamole  guarana  theophylline This list may not  describe all possible interactions. Give your health care provider a list of all the medicines, herbs, non-prescription drugs, or dietary supplements you use. Also tell them if you smoke, drink alcohol, or use illegal drugs. Some items may interact with your medicine. What should I watch for while using this medicine? Your condition will be monitored carefully while you are receiving this medicine. Do not take medicines, foods, or drinks with caffeine (like coffee, tea, or colas) for at least 12 hours before your test. If you do not know if something contains caffeine, ask your health care professional. What side effects may I notice from receiving this medicine? Side effects that you should report to your doctor or health care professional as soon as possible:  allergic reactions like skin rash, itching or hives, swelling of the face, lips, or tongue  breathing problems  chest pain, tightness or palpitations  severe headache Side effects that usually do not require medical attention (report to your doctor or health care professional if they continue or are bothersome):  flushing  headache  irritation or pain at site where injected  nausea, vomiting This list may not describe all possible side effects. Call your doctor for medical advice about side effects. You may report side effects to FDA at 1-800-FDA-1088. Where should I keep my medicine? This drug is given in a hospital or clinic and will not be stored at home. NOTE: This sheet is a summary. It may not cover all possible information. If you have questions about this medicine, talk to your doctor, pharmacist, or health care provider.  2020 Elsevier/Gold Standard (2007-08-20 15:08:13)  Cardiac Nuclear Scan A cardiac nuclear scan is a test that is done to check the flow of blood to your heart. It is done when you are resting and when you are exercising. The test looks for problems such as:  Not enough blood reaching a  portion of  the heart.  The heart muscle not working as it should. You may need this test if:  You have heart disease.  You have had lab results that are not normal.  You have had heart surgery or a balloon procedure to open up blocked arteries (angioplasty).  You have chest pain.  You have shortness of breath. In this test, a special dye (tracer) is put into your bloodstream. The tracer will travel to your heart. A camera will then take pictures of your heart to see how the tracer moves through your heart. This test is usually done at a hospital and takes 2-4 hours. Tell a doctor about:  Any allergies you have.  All medicines you are taking, including vitamins, herbs, eye drops, creams, and over-the-counter medicines.  Any problems you or family members have had with anesthetic medicines.  Any blood disorders you have.  Any surgeries you have had.  Any medical conditions you have.  Whether you are pregnant or may be pregnant. What are the risks? Generally, this is a safe test. However, problems may occur, such as:  Serious chest pain and heart attack. This is only a risk if the stress portion of the test is done.  Rapid heartbeat.  A feeling of warmth in your chest. This feeling usually does not last long.  Allergic reaction to the tracer. What happens before the test?  Ask your doctor about changing or stopping your normal medicines. This is important.  Follow instructions from your doctor about what you cannot eat or drink.  Remove your jewelry on the day of the test. What happens during the test?  An IV tube will be inserted into one of your veins.  Your doctor will give you a small amount of tracer through the IV tube.  You will wait for 20-40 minutes while the tracer moves through your bloodstream.  Your heart will be monitored with an electrocardiogram (ECG).  You will lie down on an exam table.  Pictures of your heart will be taken for about 15-20  minutes.  You may also have a stress test. For this test, one of these things may be done: ? You will be asked to exercise on a treadmill or a stationary bike. ? You will be given medicines that will make your heart work harder. This is done if you are unable to exercise.  When blood flow to your heart has peaked, a tracer will again be given through the IV tube.  After 20-40 minutes, you will get back on the exam table. More pictures will be taken of your heart.  Depending on the tracer that is used, more pictures may need to be taken 3-4 hours later.  Your IV tube will be removed when the test is over. The test may vary among doctors and hospitals. What happens after the test?  Ask your doctor: ? Whether you can return to your normal schedule, including diet, activities, and medicines. ? Whether you should drink more fluids. This will help to remove the tracer from your body. Drink enough fluid to keep your pee (urine) pale yellow.  Ask your doctor, or the department that is doing the test: ? When will my results be ready? ? How will I get my results? Summary  A cardiac nuclear scan is a test that is done to check the flow of blood to your heart.  Tell your doctor whether you are pregnant or may be pregnant.  Before the test, ask  your doctor about changing or stopping your normal medicines. This is important.  Ask your doctor whether you can return to your normal activities. You may be asked to drink more fluids. This information is not intended to replace advice given to you by your health care provider. Make sure you discuss any questions you have with your health care provider. Document Released: 06/05/2017 Document Revised: 04/11/2018 Document Reviewed: 06/05/2017 Elsevier Patient Education  2020 [employer].

## 2018-07-24 ENCOUNTER — Telehealth: Payer: Self-pay | Admitting: Cardiology

## 2018-07-24 MED ORDER — NITROGLYCERIN 0.4 MG SL SUBL: 0.4000 mg | SUBLINGUAL_TABLET | SUBLINGUAL | 3 refills | Status: DC | PRN

## 2018-07-24 NOTE — Telephone Encounter (Signed)
Call nitroglycerin to [facility]-states it was supposed to be called in yesterday

## 2018-07-24 NOTE — Addendum Note (Signed)
Addended by: Beckey Rutter on: 07/24/2018 01:23 PM   Modules accepted: Orders

## 2018-07-24 NOTE — Addendum Note (Signed)
Addended by: Beckey Rutter on: 07/24/2018 08:26 AM   Modules accepted: Orders

## 2018-07-25 ENCOUNTER — Telehealth: Payer: Self-pay

## 2018-07-25 NOTE — Telephone Encounter (Signed)
LAbs fax to [employer] at [phone]. Labs fax and confirmed.

## 2018-07-25 NOTE — Telephone Encounter (Signed)
Notes recorded by Marval Regal, RN on 07/25/2018 at 9:58 AM EDT  I called patient about his Lipid panel results are elevated. I stated Dr. Leonie Man recommend him start taking crestor 5mg . Also to follow up with his PCP for further evaluation. Pt stated he cannot take any statins due to muscle pain and joints. He is taking red yeast rice. He states his PCP is aware of the statin intolerance. I stated a copy will be sent to his PCP. Pt verbalized understanding.  ------

## 2018-07-25 NOTE — Telephone Encounter (Signed)
-----   Message from Garvin Fila, MD sent at 07/20/2018  2:57 PM EDT ----- Cameron Bryan advise the patient that bad cholesterol and triglycerides are both elevated and I recommend he start taking Crestor 5 mg daily.  I have sent electronic prescription to his pharmacy.  He needs to follow-up with his primary care physician Dr. Helene Kelp for further follow-up of lipid profile

## 2018-08-01 ENCOUNTER — Telehealth (HOSPITAL_COMMUNITY): Payer: Self-pay | Admitting: *Deleted

## 2018-08-01 NOTE — Telephone Encounter (Signed)
Left message on voicemail per DPR in reference to upcoming appointment scheduled on 08/09/18 with detailed instructions given per Myocardial Perfusion Study Information Sheet for the test. LM to arrive 15 minutes early, and that it is imperative to arrive on time for appointment to keep from having the test rescheduled. If you need to cancel or reschedule your appointment, please call the office within 24 hours of your appointment. Failure to do so may result in a cancellation of your appointment, and a $50 no show fee. Phone number given for call back for any questions. Kaulder Zahner Jacqueline    

## 2018-08-22 ENCOUNTER — Other Ambulatory Visit: Payer: Self-pay

## 2018-08-22 NOTE — Patient Outreach (Signed)
[facility]) Care Management  08/22/2018  OZZIE KNOBEL [birth year] [id number]   Medication Adherence call to Mr. Noell Shular HIPPA Compliant Voice message left with a call back number. Mr. Rusher is showing past due on Rosuvastatin 5 mg under [place].   [facility] Management Direct Dial [phone]  Fax [phone] [email or web address]

## 2018-09-06 ENCOUNTER — Telehealth (HOSPITAL_COMMUNITY): Payer: Self-pay | Admitting: *Deleted

## 2018-09-06 ENCOUNTER — Ambulatory Visit (INDEPENDENT_AMBULATORY_CARE_PROVIDER_SITE_OTHER): Payer: Medicare Other

## 2018-09-06 ENCOUNTER — Other Ambulatory Visit: Payer: Self-pay

## 2018-09-06 DIAGNOSIS — R600 Localized edema: Secondary | ICD-10-CM

## 2018-09-06 DIAGNOSIS — R011 Cardiac murmur, unspecified: Secondary | ICD-10-CM | POA: Diagnosis not present

## 2018-09-06 NOTE — Progress Notes (Addendum)
Lower extremity venous duplex exam has been performed. No DVT was seen   [facility], RVT

## 2018-09-06 NOTE — Progress Notes (Signed)
Complete echocardiogram has been performed.  Jimmy Josede Cicero RDCS, RVT 

## 2018-09-06 NOTE — Telephone Encounter (Signed)
Left message on voicemail per DPR in reference to upcoming appointment scheduled on 09/12/18 with detailed instructions given per Myocardial Perfusion Study Information Sheet for the test. LM to arrive 15 minutes early, and that it is imperative to arrive on time for appointment to keep from having the test rescheduled. If you need to cancel or reschedule your appointment, please call the office within 24 hours of your appointment. Failure to do so may result in a cancellation of your appointment, and a $50 no show fee. Phone number given for call back for any questions. Harleigh Civello Jacqueline   

## 2018-09-11 DIAGNOSIS — I10 Essential (primary) hypertension: Secondary | ICD-10-CM | POA: Diagnosis not present

## 2018-09-11 DIAGNOSIS — Z23 Encounter for immunization: Secondary | ICD-10-CM | POA: Diagnosis not present

## 2018-09-11 DIAGNOSIS — Z0000 Encounter for general adult medical examination without abnormal findings: Secondary | ICD-10-CM | POA: Diagnosis not present

## 2018-09-12 ENCOUNTER — Ambulatory Visit (INDEPENDENT_AMBULATORY_CARE_PROVIDER_SITE_OTHER): Payer: Medicare Other

## 2018-09-12 ENCOUNTER — Other Ambulatory Visit: Payer: Self-pay

## 2018-09-12 DIAGNOSIS — R079 Chest pain, unspecified: Secondary | ICD-10-CM | POA: Diagnosis not present

## 2018-09-12 MED ORDER — TECHNETIUM TC 99M TETROFOSMIN IV KIT
10.0000 | PACK | Freq: Once | INTRAVENOUS | Status: AC | PRN
  Administered 2018-09-12: 10 via INTRAVENOUS

## 2018-09-12 MED ORDER — REGADENOSON 0.4 MG/5ML IV SOLN
0.4000 mg | Freq: Once | INTRAVENOUS | Status: AC
  Administered 2018-09-12: 0.4 mg via INTRAVENOUS

## 2018-09-12 MED ORDER — TECHNETIUM TC 99M TETROFOSMIN IV KIT
29.3000 | PACK | Freq: Once | INTRAVENOUS | Status: AC | PRN
  Administered 2018-09-12: 29.3 via INTRAVENOUS

## 2018-09-13 ENCOUNTER — Telehealth: Payer: Self-pay

## 2018-09-13 LAB — MYOCARDIAL PERFUSION IMAGING
LV dias vol: 128 mL (ref 62–150)
LV sys vol: 49 mL
Peak HR: 108 {beats}/min
Rest HR: 77 {beats}/min
SDS: 2
SRS: 2
SSS: 4
TID: 1.07

## 2018-09-13 NOTE — Telephone Encounter (Signed)
Left message that echo and dvt results were good. Copy of both sent to Dr. Helene Kelp per Dr. Docia Furl request.

## 2018-09-13 NOTE — Telephone Encounter (Signed)
-----   Message from Jenean Lindau, MD sent at 09/11/2018  9:48 AM EDT ----- The results of the study is unremarkable. Please inform patient. I will discuss in detail at next appointment. Cc  primary care/referring physician Jenean Lindau, MD 09/11/2018 9:47 AM

## 2018-10-03 ENCOUNTER — Ambulatory Visit: Payer: Medicare Other | Admitting: Cardiology

## 2018-10-10 ENCOUNTER — Ambulatory Visit: Payer: Medicare Other | Admitting: Cardiology

## 2018-10-22 DIAGNOSIS — I69992 Facial weakness following unspecified cerebrovascular disease: Secondary | ICD-10-CM | POA: Diagnosis not present

## 2018-10-22 DIAGNOSIS — I10 Essential (primary) hypertension: Secondary | ICD-10-CM | POA: Diagnosis not present

## 2018-10-22 DIAGNOSIS — R5383 Other fatigue: Secondary | ICD-10-CM | POA: Diagnosis not present

## 2018-10-22 DIAGNOSIS — E876 Hypokalemia: Secondary | ICD-10-CM | POA: Diagnosis not present

## 2018-10-22 DIAGNOSIS — I69954 Hemiplegia and hemiparesis following unspecified cerebrovascular disease affecting left non-dominant side: Secondary | ICD-10-CM | POA: Diagnosis not present

## 2018-10-22 DIAGNOSIS — I6521 Occlusion and stenosis of right carotid artery: Secondary | ICD-10-CM | POA: Diagnosis not present

## 2018-10-22 DIAGNOSIS — J449 Chronic obstructive pulmonary disease, unspecified: Secondary | ICD-10-CM | POA: Diagnosis not present

## 2018-11-08 ENCOUNTER — Ambulatory Visit: Payer: Medicare Other | Admitting: Cardiology

## 2018-11-21 DIAGNOSIS — R7309 Other abnormal glucose: Secondary | ICD-10-CM | POA: Diagnosis not present

## 2018-11-21 DIAGNOSIS — Z79899 Other long term (current) drug therapy: Secondary | ICD-10-CM | POA: Diagnosis not present

## 2018-11-21 DIAGNOSIS — E785 Hyperlipidemia, unspecified: Secondary | ICD-10-CM | POA: Diagnosis not present

## 2018-12-04 ENCOUNTER — Encounter: Payer: Self-pay | Admitting: Cardiology

## 2018-12-04 ENCOUNTER — Ambulatory Visit (INDEPENDENT_AMBULATORY_CARE_PROVIDER_SITE_OTHER): Payer: Medicare Other | Admitting: Cardiology

## 2018-12-04 ENCOUNTER — Other Ambulatory Visit: Payer: Self-pay

## 2018-12-04 VITALS — BP 160/104 | HR 91 | Ht 69.0 in | Wt 217.0 lb

## 2018-12-04 DIAGNOSIS — F17210 Nicotine dependence, cigarettes, uncomplicated: Secondary | ICD-10-CM | POA: Diagnosis not present

## 2018-12-04 DIAGNOSIS — I10 Essential (primary) hypertension: Secondary | ICD-10-CM | POA: Diagnosis not present

## 2018-12-04 DIAGNOSIS — E782 Mixed hyperlipidemia: Secondary | ICD-10-CM | POA: Diagnosis not present

## 2018-12-04 HISTORY — DX: Mixed hyperlipidemia: E78.2

## 2018-12-04 NOTE — Progress Notes (Signed)
Cardiology Office Note:    Date:  12/04/2018   ID:  Cameron Bryan, DOB 02-27-1970, MRN [id number]  PCP:  Ronita Hipps, MD  Cardiologist:  Jenean Lindau, MD   Referring MD: Ronita Hipps, MD    ASSESSMENT:    1. Essential hypertension   2. Cigarette smoker   3. Mixed dyslipidemia    PLAN:    In order of problems listed above:  1. Chest discomfort: Results of the aforementioned tests were discussed with him and is happy about it.  His symptoms have now resolved primary prevention stressed to him.  Importance of compliance with diet and medication stressed and he vocalized understanding. 2. Cigarette smoker: I spent 5 minutes with the patient discussing solely about smoking. Smoking cessation was counseled. I suggested to the patient also different medications and pharmacological interventions. Patient is keen to try stopping on its own at this time. He will get back to me if he needs any further assistance in this matter. 3. Essential hypertension: He mentions to me that he was in a rush today and his blood pressure is better and he is going to keep a track of it twice a day and bring it to me in the next appointment in 2 months.  Food and lifestyle modification and dietary issues were discussed with him at length and he promises to comply 4. Mixed dyslipidemia: Managed by primary care physician.  I reviewed recent lipids.  He is vehemently against statins or any lipid-lowering therapy and risks explained to him and he vocalized understanding.  He promises to do with diet better.  I respect his wishes. 5. Patient will be seen in follow-up appointment in 2 months or earlier if the patient has any concerns    Medication Adjustments/Labs and Tests Ordered: Current medicines are reviewed at length with the patient today.  Concerns regarding medicines are outlined above.  No orders of the defined types were placed in this encounter.  No orders of the defined types were placed in this  encounter.    Chief Complaint  Patient presents with  . Follow-up     History of Present Illness:    OLVIN Bryan is a 48 y.o. male.  Patient has past medical history of essential hypertension and dyslipidemia.  He denies any problems at this time and takes care of activities of daily living.  No chest pain orthopnea or PND.  His stress test is unremarkable and details are mentioned below.  At the time of my evaluation, the patient is alert awake oriented and in no distress.  Unfortunately continues to smoke fairly heavily. Past Medical History:  Diagnosis Date  . Bipolar 1 disorder ([place])   . Common migraine   . COPD (chronic obstructive pulmonary disease) ([place])   . Depression   . GERD (gastroesophageal reflux disease)   . Headache   . Hypertension   . 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[place])   . Schizoaffective disorder ([place])   . Stroke [facility])     No past surgical history on file.  Current Medications: Current Meds  Medication Sig  . ALPRAZolam (XANAX) 1 MG tablet 2 (two) times daily.   Marland Kitchen amLODipine (NORVASC) 10 MG tablet Take 10 mg by mouth daily.  Marland Kitchen aspirin 325 MG tablet Take 325 mg by mouth daily.  . furosemide (LASIX) 20 MG tablet Take 20 mg by mouth every other day.   Marland Kitchen IRON-B12-VITAMINS PO Take by mouth.  . meloxicam (MOBIC) 15 MG tablet Take 15  mg by mouth daily.   . nitroGLYCERIN (NITROSTAT) 0.4 MG SL tablet Place 1 tablet (0.4 mg total) under the tongue every 5 (five) minutes as needed.  Marland Kitchen olmesartan-hydrochlorothiazide (BENICAR HCT) 40-25 MG per tablet Take 1 tablet by mouth daily.  . ondansetron (ZOFRAN ODT) 4 MG disintegrating tablet Take 1 tablet (4 mg total) by mouth every 8 (eight) hours as needed for nausea or vomiting.  . pantoprazole (PROTONIX) 40 MG tablet Take 40 mg by mouth daily.   . potassium chloride (KLOR-CON) 10 MEQ tablet 10 mEq. Take 2 in am and 1 in pm  . SUMAtriptan (IMITREX) 100 MG tablet as needed.  . [DISCONTINUED] vitamin B-12 (CYANOCOBALAMIN) 1000 MCG  tablet Take 1,000 mcg by mouth daily.     Allergies:   Chantix [varenicline tartrate], Kenalog [triamcinolone acetonide], Lipitor [atorvastatin calcium], Oxycodone, Prednisone, and Valtrex [valacyclovir hcl]   Social History   Socioeconomic History  . Marital status: Single    Spouse name: Not on file  . Number of children: Not on file  . Years of education: Not on file  . Highest education level: Not on file  Occupational History  . Not on file  Social Needs  . Financial resource strain: Not on file  . Food insecurity    Worry: Not on file    Inability: Not on file  . Transportation needs    Medical: Not on file    Non-medical: Not on file  Tobacco Use  . Smoking status: Current Every Day Smoker    Packs/day: 1.00  . Smokeless tobacco: Never Used  Substance and Sexual Activity  . Alcohol use: No  . Drug use: No  . Sexual activity: Not on file  Lifestyle  . Physical activity    Days per week: Not on file    Minutes per session: Not on file  . Stress: Not on file  Relationships  . Social Musician on phone: Not on file    Gets together: Not on file    Attends religious service: Not on file    Active member of club or organization: Not on file    Attends meetings of clubs or organizations: Not on file    Relationship status: Not on file  Other Topics Concern  . Not on file  Social History Narrative  . Not on file     Family History: The patient's family history includes Alzheimer's disease in his paternal grandfather.  ROS:   Please see the history of present illness.    All other systems reviewed and are negative.  EKGs/Labs/Other Studies Reviewed:    The following studies were reviewed today: Study Highlights   The left ventricular ejection fraction is normal (55-65%).  Nuclear stress EF: 62%.  There was no ST segment deviation noted during stress.  This is a low risk study.  No evidence of ischemia or MI  Normal EF.      IMPRESSIONS    1. The left ventricle has normal systolic function with an ejection fraction of 60-65%. The cavity size was normal. Left ventricular diastolic Doppler parameters are consistent with pseudonormalization.  2. The right ventricle has normal systolic function. The cavity was normal. There is no increase in right ventricular wall thickness.  3. Left atrial size was mild-moderately dilated.  4. The mitral valve is grossly normal.  5. The tricuspid valve is grossly normal.  6. The aortic valve is grossly normal. Mild sclerosis of the aortic valve. Aortic valve regurgitation  was not assessed by color flow Doppler.  7. The aorta is normal unless otherwise noted.   Recent Labs: No results found for requested labs within last 8760 hours.  Recent Lipid Panel    Component Value Date/Time   CHOL 186 07/18/2018 1456   TRIG 191 (H) 07/18/2018 1456   HDL 40 07/18/2018 1456   CHOLHDL 4.7 07/18/2018 1456   LDLCALC 108 (H) 07/18/2018 1456    Physical Exam:    VS:  BP (!) 160/104 (BP Location: Left Arm, Patient Position: Sitting, Cuff Size: Normal)   Pulse 91   Ht 5\' 9"  (1.753 m)   Wt 217 lb (98.4 kg)   SpO2 99%   BMI 32.05 kg/m     Wt Readings from Last 3 Encounters:  12/04/18 217 lb (98.4 kg)  09/12/18 216 lb (98 kg)  07/23/18 216 lb (98 kg)     GEN: Patient is in no acute distress HEENT: Normal NECK: No JVD; No carotid bruits LYMPHATICS: No lymphadenopathy CARDIAC: Hear sounds regular, 2/6 systolic murmur at the apex. RESPIRATORY:  Clear to auscultation without rales, wheezing or rhonchi  ABDOMEN: Soft, non-tender, non-distended MUSCULOSKELETAL:  No edema; No deformity  SKIN: Warm and dry NEUROLOGIC:  Alert and oriented x 3 PSYCHIATRIC:  Normal affect   Signed, Garwin Brothersajan R Araeya Lamb, MD  12/04/2018 10:30 AM    [facility]

## 2018-12-04 NOTE — Patient Instructions (Signed)
Medication Instructions:  Your physician recommends that you continue on your current medications as directed. Please refer to the Current Medication list given to you today.  *If you need a refill on your cardiac medications before your next appointment, please call your pharmacy*  Lab Work: NONE If you have labs (blood work) drawn today and your tests are completely normal, you will receive your results only by: . MyChart Message (if you have MyChart) OR . A paper copy in the mail If you have any lab test that is abnormal or we need to change your treatment, we will call you to review the results.  Testing/Procedures: NONE  Follow-Up: At [facility], you and your health needs are our priority.  As part of our continuing mission to provide you with exceptional heart care, we have created designated Provider Care Teams.  These Care Teams include your primary Cardiologist (physician) and Advanced Practice Providers (APPs -  Physician Assistants and Nurse Practitioners) who all work together to provide you with the care you need, when you need it.  Your next appointment:   2 month(s)  The format for your next appointment:   In Person  Provider:   Rajan Revankar, MD    

## 2018-12-05 ENCOUNTER — Ambulatory Visit: Payer: Medicare Other | Admitting: Cardiology

## 2018-12-21 DIAGNOSIS — R52 Pain, unspecified: Secondary | ICD-10-CM | POA: Diagnosis not present

## 2018-12-21 DIAGNOSIS — R432 Parageusia: Secondary | ICD-10-CM | POA: Diagnosis not present

## 2018-12-21 DIAGNOSIS — R509 Fever, unspecified: Secondary | ICD-10-CM | POA: Diagnosis not present

## 2018-12-21 DIAGNOSIS — R197 Diarrhea, unspecified: Secondary | ICD-10-CM | POA: Diagnosis not present

## 2019-01-19 DIAGNOSIS — S29012A Strain of muscle and tendon of back wall of thorax, initial encounter: Secondary | ICD-10-CM | POA: Diagnosis not present

## 2019-01-19 DIAGNOSIS — S39012A Strain of muscle, fascia and tendon of lower back, initial encounter: Secondary | ICD-10-CM | POA: Diagnosis not present

## 2019-01-23 DIAGNOSIS — I10 Essential (primary) hypertension: Secondary | ICD-10-CM | POA: Diagnosis not present

## 2019-01-23 DIAGNOSIS — S39012A Strain of muscle, fascia and tendon of lower back, initial encounter: Secondary | ICD-10-CM | POA: Diagnosis not present

## 2019-01-31 DIAGNOSIS — M2569 Stiffness of other specified joint, not elsewhere classified: Secondary | ICD-10-CM | POA: Diagnosis not present

## 2019-01-31 DIAGNOSIS — S39012A Strain of muscle, fascia and tendon of lower back, initial encounter: Secondary | ICD-10-CM | POA: Diagnosis not present

## 2019-02-04 ENCOUNTER — Other Ambulatory Visit: Payer: Self-pay

## 2019-02-04 ENCOUNTER — Ambulatory Visit (INDEPENDENT_AMBULATORY_CARE_PROVIDER_SITE_OTHER): Payer: Medicare Other | Admitting: Cardiology

## 2019-02-04 ENCOUNTER — Encounter: Payer: Self-pay | Admitting: Cardiology

## 2019-02-04 VITALS — BP 168/104 | HR 82 | Ht 69.0 in | Wt 224.0 lb

## 2019-02-04 DIAGNOSIS — I10 Essential (primary) hypertension: Secondary | ICD-10-CM | POA: Diagnosis not present

## 2019-02-04 NOTE — Patient Instructions (Signed)
Medication Instructions:  No medications ordered.  *If you need a refill on your cardiac medications before your next appointment, please call your pharmacy*  Lab Work: None ordered If you have labs (blood work) drawn today and your tests are completely normal, you will receive your results only by: Marland Kitchen MyChart Message (if you have MyChart) OR . A paper copy in the mail If you have any lab test that is abnormal or we need to change your treatment, we will call you to review the results.  Testing/Procedures: None ordered  Follow-Up: At [facility], you and your health needs are our priority.  As part of our continuing mission to provide you with exceptional heart care, we have created designated Provider Care Teams.  These Care Teams include your primary Cardiologist (physician) and Advanced Practice Providers (APPs -  Physician Assistants and Nurse Practitioners) who all work together to provide you with the care you need, when you need it.  Your next appointment:   6 month(s)  The format for your next appointment:   In Person  Provider:   Belva Crome, MD  Other Instructions NA

## 2019-02-04 NOTE — Progress Notes (Addendum)
Cardiology Office Note:    Date:  02/04/2019   ID:  Cameron Bryan, DOB July 26, 1970, MRN [id number]  PCP:  Cameron Hipps, MD  Cardiologist:  Cameron Lindau, MD   Referring MD: Cameron Hipps, MD    ASSESSMENT:    No diagnosis found. PLAN:    In order of problems listed above:  1. Primary prevention stressed with the patient.  Importance of compliance with diet and medication stressed and he vocalized understanding. 2. Essential hypertension: Blood pressure is elevated and he has an element of whitecoat hypertension.  He also is stressed out this morning.  His blood pressure generally runs fine at home.  He will keep a track of it. 3. Mixed dyslipidemia: Diet was discussed weight reduction was stressed.  Importance of regular exercise stressed and he promises to do better. 4. Patient will be seen in follow-up appointment in 6 months or earlier if the patient has any concerns.  He also promises to quit smoking in the next few weeks.    Medication Adjustments/Labs and Tests Ordered: Current medicines are reviewed at length with the patient today.  Concerns regarding medicines are outlined above.  No orders of the defined types were placed in this encounter.  No orders of the defined types were placed in this encounter.    No chief complaint on file.    History of Present Illness:    Cameron Bryan is a 49 y.o. male.  Patient has past medical history of essential hypertension and dyslipidemia.  He denies any problems at this time and takes care of activities of daily living.  No chest pain orthopnea or PND.  He leads a sedentary lifestyle.  He mentions to me that he has an issue with identity theft possibly this morning and that has aggravated him and his blood pressure is elevated.  He checks his blood pressure on a regular basis at home and it is in the range of 120/70.  At the time of my evaluation, the patient is alert awake oriented and in no distress.  Past Medical History:    Diagnosis Date  . Bipolar 1 disorder ([place])   . Common migraine   . COPD (chronic obstructive pulmonary disease) ([place])   . Depression   . GERD (gastroesophageal reflux disease)   . Headache   . Hypertension   . 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[place])   . Schizoaffective disorder ([place])   . Stroke [facility])     History reviewed. No pertinent surgical history.  Current Medications: Current Meds  Medication Sig  . ALPRAZolam (XANAX) 1 MG tablet 2 (two) times daily.   Marland Kitchen amLODipine (NORVASC) 10 MG tablet Take 10 mg by mouth daily.  Marland Kitchen aspirin 325 MG tablet Take 325 mg by mouth daily.  . furosemide (LASIX) 20 MG tablet Take 20 mg by mouth every other day.   . gabapentin (NEURONTIN) 300 MG capsule   . IRON-B12-VITAMINS PO Take by mouth.  . meloxicam (MOBIC) 15 MG tablet Take 15 mg by mouth daily.   Marland Kitchen olmesartan-hydrochlorothiazide (BENICAR HCT) 40-25 MG per tablet Take 1 tablet by mouth daily.  . ondansetron (ZOFRAN ODT) 4 MG disintegrating tablet Take 1 tablet (4 mg total) by mouth every 8 (eight) hours as needed for nausea or vomiting.  . pantoprazole (PROTONIX) 40 MG tablet Take 40 mg by mouth daily.   . potassium chloride (KLOR-CON) 10 MEQ tablet 10 mEq. Take 2 in am and 1 in pm  . SUMAtriptan (IMITREX) 100 MG  tablet as needed.     Allergies:   Chantix [varenicline tartrate], Kenalog [triamcinolone acetonide], Lipitor [atorvastatin calcium], Oxycodone, Prednisone, and Valtrex [valacyclovir hcl]   Social History   Socioeconomic History  . Marital status: Single    Spouse name: Not on file  . Number of children: Not on file  . Years of education: Not on file  . Highest education level: Not on file  Occupational History  . Not on file  Tobacco Use  . Smoking status: Current Every Day Smoker    Packs/day: 1.00  . Smokeless tobacco: Never Used  Substance and Sexual Activity  . Alcohol use: No  . Drug use: No  . Sexual activity: Not on file  Other Topics Concern  . Not on file  Social  History Narrative  . Not on file   Social Determinants of Health   Financial Resource Strain:   . Difficulty of Paying Living Expenses: Not on file  Food Insecurity:   . Worried About Programme researcher, broadcasting/film/video in the Last Year: Not on file  . Ran Out of Food in the Last Year: Not on file  Transportation Needs:   . Lack of Transportation (Medical): Not on file  . Lack of Transportation (Non-Medical): Not on file  Physical Activity:   . Days of Exercise per Week: Not on file  . Minutes of Exercise per Session: Not on file  Stress:   . Feeling of Stress : Not on file  Social Connections:   . Frequency of Communication with Friends and Family: Not on file  . Frequency of Social Gatherings with Friends and Family: Not on file  . Attends Religious Services: Not on file  . Active Member of Clubs or Organizations: Not on file  . Attends Banker Meetings: Not on file  . Marital Status: Not on file     Family History: The patient's family history includes Alzheimer's disease in his paternal grandfather.  ROS:   Please see the history of present illness.    All other systems reviewed and are negative.  EKGs/Labs/Other Studies Reviewed:    The following studies were reviewed today: IMPRESSIONS    1. The left ventricle has normal systolic function with an ejection  fraction of 60-65%. The cavity size was normal. Left ventricular diastolic  Doppler parameters are consistent with pseudonormalization.  2. The right ventricle has normal systolic function. The cavity was  normal. There is no increase in right ventricular wall thickness.  3. Left atrial size was mild-moderately dilated.  4. The mitral valve is grossly normal.  5. The tricuspid valve is grossly normal.  6. The aortic valve is grossly normal. Mild sclerosis of the aortic  valve. Aortic valve regurgitation was not assessed by color flow Doppler.  7. The aorta is normal unless otherwise noted.    Recent  Labs: No results found for requested labs within last 8760 hours.  Recent Lipid Panel    Component Value Date/Time   CHOL 186 07/18/2018 1456   TRIG 191 (H) 07/18/2018 1456   HDL 40 07/18/2018 1456   CHOLHDL 4.7 07/18/2018 1456   LDLCALC 108 (H) 07/18/2018 1456    Physical Exam:    VS:  BP (!) 168/104   Pulse 82   Ht 5\' 9"  (1.753 m)   Wt 224 lb (101.6 kg)   SpO2 98%   BMI 33.08 kg/m     Wt Readings from Last 3 Encounters:  02/04/19 224 lb (101.6 kg)  12/04/18  217 lb (98.4 kg)  09/12/18 216 lb (98 kg)     GEN: Patient is in no acute distress HEENT: Normal NECK: No JVD; No carotid bruits LYMPHATICS: No lymphadenopathy CARDIAC: Hear sounds regular, 2/6 systolic murmur at the apex. RESPIRATORY:  Clear to auscultation without rales, wheezing or rhonchi  ABDOMEN: Soft, non-tender, non-distended MUSCULOSKELETAL:  No edema; No deformity  SKIN: Warm and dry NEUROLOGIC:  Alert and oriented x 3 PSYCHIATRIC:  Normal affect   Signed, Garwin Brothers, MD  02/04/2019 10:25 AM    [facility]

## 2019-02-06 DIAGNOSIS — Z720 Tobacco use: Secondary | ICD-10-CM | POA: Diagnosis not present

## 2019-02-06 DIAGNOSIS — S39012A Strain of muscle, fascia and tendon of lower back, initial encounter: Secondary | ICD-10-CM | POA: Diagnosis not present

## 2019-04-08 DIAGNOSIS — M25432 Effusion, left wrist: Secondary | ICD-10-CM | POA: Diagnosis not present

## 2019-04-12 DIAGNOSIS — I10 Essential (primary) hypertension: Secondary | ICD-10-CM | POA: Diagnosis not present

## 2019-04-19 DIAGNOSIS — M25532 Pain in left wrist: Secondary | ICD-10-CM

## 2019-04-19 DIAGNOSIS — M25832 Other specified joint disorders, left wrist: Secondary | ICD-10-CM | POA: Diagnosis not present

## 2019-04-19 HISTORY — DX: Pain in left wrist: M25.532

## 2019-04-25 DIAGNOSIS — M25532 Pain in left wrist: Secondary | ICD-10-CM | POA: Diagnosis not present

## 2019-05-02 DIAGNOSIS — M67432 Ganglion, left wrist: Secondary | ICD-10-CM | POA: Diagnosis not present

## 2019-05-02 DIAGNOSIS — M25532 Pain in left wrist: Secondary | ICD-10-CM | POA: Diagnosis not present

## 2019-05-03 DIAGNOSIS — K219 Gastro-esophageal reflux disease without esophagitis: Secondary | ICD-10-CM | POA: Diagnosis not present

## 2019-05-03 DIAGNOSIS — I10 Essential (primary) hypertension: Secondary | ICD-10-CM | POA: Diagnosis not present

## 2019-05-06 DIAGNOSIS — M67432 Ganglion, left wrist: Secondary | ICD-10-CM

## 2019-05-06 HISTORY — DX: Ganglion, left wrist: M67.432

## 2019-05-17 DIAGNOSIS — M67432 Ganglion, left wrist: Secondary | ICD-10-CM | POA: Diagnosis not present

## 2019-05-17 DIAGNOSIS — Z4789 Encounter for other orthopedic aftercare: Secondary | ICD-10-CM

## 2019-05-17 DIAGNOSIS — I10 Essential (primary) hypertension: Secondary | ICD-10-CM | POA: Insufficient documentation

## 2019-05-17 HISTORY — DX: Encounter for other orthopedic aftercare: Z47.89

## 2019-06-13 DIAGNOSIS — A6920 Lyme disease, unspecified: Secondary | ICD-10-CM | POA: Diagnosis not present

## 2019-07-03 DIAGNOSIS — E785 Hyperlipidemia, unspecified: Secondary | ICD-10-CM | POA: Diagnosis not present

## 2019-07-03 DIAGNOSIS — K219 Gastro-esophageal reflux disease without esophagitis: Secondary | ICD-10-CM | POA: Diagnosis not present

## 2019-07-03 DIAGNOSIS — I10 Essential (primary) hypertension: Secondary | ICD-10-CM | POA: Diagnosis not present

## 2019-08-28 DIAGNOSIS — I10 Essential (primary) hypertension: Secondary | ICD-10-CM | POA: Diagnosis not present

## 2019-08-28 DIAGNOSIS — H6120 Impacted cerumen, unspecified ear: Secondary | ICD-10-CM | POA: Diagnosis not present

## 2019-08-29 ENCOUNTER — Other Ambulatory Visit: Payer: Self-pay

## 2019-08-29 ENCOUNTER — Encounter: Payer: Self-pay | Admitting: Cardiology

## 2019-08-29 ENCOUNTER — Ambulatory Visit (INDEPENDENT_AMBULATORY_CARE_PROVIDER_SITE_OTHER): Payer: Medicare Other | Admitting: Cardiology

## 2019-08-29 VITALS — BP 160/98 | HR 69 | Ht 69.0 in | Wt 224.2 lb

## 2019-08-29 DIAGNOSIS — E782 Mixed hyperlipidemia: Secondary | ICD-10-CM | POA: Diagnosis not present

## 2019-08-29 DIAGNOSIS — I209 Angina pectoris, unspecified: Secondary | ICD-10-CM | POA: Diagnosis not present

## 2019-08-29 DIAGNOSIS — F17210 Nicotine dependence, cigarettes, uncomplicated: Secondary | ICD-10-CM

## 2019-08-29 DIAGNOSIS — I10 Essential (primary) hypertension: Secondary | ICD-10-CM | POA: Diagnosis not present

## 2019-08-29 HISTORY — DX: Angina pectoris, unspecified: I20.9

## 2019-08-29 MED ORDER — FUROSEMIDE 20 MG PO TABS: 20.0000 mg | ORAL_TABLET | ORAL | 3 refills | Status: DC

## 2019-08-29 MED ORDER — NITROGLYCERIN 0.4 MG SL SUBL: 0.4000 mg | SUBLINGUAL_TABLET | SUBLINGUAL | 3 refills | Status: DC | PRN

## 2019-08-29 MED ORDER — ISOSORBIDE MONONITRATE ER 60 MG PO TB24: 60.0000 mg | ORAL_TABLET | Freq: Every day | ORAL | 3 refills | Status: DC

## 2019-08-29 NOTE — Patient Instructions (Signed)
Medication Instructions:  Your physician has recommended you make the following change in your medication:   Decrease your aspirin to 81 mg daily. Start Imdur 60 mg daily.  *If you need a refill on your cardiac medications before your next appointment, please call your pharmacy*   Lab Work: Your physician recommends that you have labs done in the office today. Your test included  basic metabolic panel, complete blood count.  If you have labs (blood work) drawn today and your tests are completely normal, you will receive your results only by: Marland Kitchen MyChart Message (if you have MyChart) OR . A paper copy in the mail If you have any lab test that is abnormal or we need to change your treatment, we will call you to review the results.   Testing/Procedures:    [facility] CARDIOVASCULAR DIV[facility] AT [facility] [address] [place] [address] Dept: [phone] Loc: [phone]  Cameron Bryan  08/29/2019  You are scheduled for a Cardiac Catheterization on Thursday, September 2 with Dr. Verdis Prime.  1. Please arrive at the [facility] (Main Entrance A) at [facility]: [address] [place], [place] [address] at 8:30 AM (This time is two hours before your procedure to ensure your preparation). Free valet parking service is available.   Special note: Every effort is made to have your procedure done on time. Please understand that emergencies sometimes delay scheduled procedures.  2. Diet: Do not eat solid foods after midnight.  The patient may have clear liquids until 5am upon the day of the procedure.  3. Labs: You .  4. Medication instructions in preparation for your procedure:   Contrast Allergy: No  Stop taking, Benicar (Olmesartan) Thursday, September 2,  Stop taking, Mobic (Meloxicam) Tuesday, September 1,   Stop taking, Lasix (Furosemide)  Thursday, September 2,   On the morning of your procedure, take your Aspirin and  any morning medicines NOT listed above.  You may use sips of water.  5. Plan for one night stay--bring personal belongings. 6. Bring a current list of your medications and current insurance cards. 7. You MUST have a responsible person to drive you home. 8. Someone MUST be with you the first 24 hours after you arrive home or your discharge will be delayed. 9. Please wear clothes that are easy to get on and off and wear slip-on shoes.  Thank you for allowing [place] to care for you!   -- [facility]    Follow-Up: At [facility], you and your health needs are our priority.  As part of our continuing mission to provide you with exceptional heart care, we have created designated Provider Care Teams.  These Care Teams include your primary Cardiologist (physician) and Advanced Practice Providers (APPs -  Physician Assistants and Nurse Practitioners) who all work together to provide you with the care you need, when you need it.  We recommend signing up for the patient portal called "MyChart".  Sign up information is provided on this After Visit Summary.  MyChart is used to connect with patients for Virtual Visits (Telemedicine).  Patients are able to view lab/test results, encounter notes, upcoming appointments, etc.  Non-urgent messages can be sent to your provider as well.   To learn more about what you can do with MyChart, go to [email or web address].    Your next appointment:   6 month(s)  The format for your next appointment:   In Person  Provider:   Belva Crome, MD   Other Instructions Isosorbide Mononitrate extended-release tablets What is this medicine? ISOSORBIDE MONONITRATE (eye soe SOR bide mon oh NYE trate) is a vasodilator. It relaxes blood vessels, increasing the blood and oxygen supply to your heart. This medicine is used to prevent chest pain caused by angina. It will not help to stop an episode of chest pain. This medicine may be used for  other purposes; ask your health care provider or pharmacist if you have questions. COMMON BRAND NAME(S): Imdur, Isotrate ER What should I tell my health care provider before I take this medicine? They need to know if you have any of these conditions:  previous heart attack or heart failure  an unusual or allergic reaction to isosorbide mononitrate, nitrates, other medicines, foods, dyes, or preservatives  pregnant or trying to get pregnant  breast-feeding How should I use this medicine? Take this medicine by mouth with a glass of water. Follow the directions on the prescription label. Do not crush or chew. Take your medicine at regular intervals. Do not take your medicine more often than directed. Do not stop taking this medicine except on the advice of your doctor or health care professional. Talk to your pediatrician regarding the use of this medicine in children. Special care may be needed. Overdosage: If you think you have taken too much of this medicine contact a poison control center or emergency room at once. NOTE: This medicine is only for you. Do not share this medicine with others. What if I miss a dose? If you miss a dose, take it as soon as you can. If it is almost time for your next dose, take only that dose. Do not take double or extra doses. What may interact with this medicine? Do not take this medicine with any of the following medications:  medicines used to treat erectile dysfunction (ED) like avanafil, sildenafil, tadalafil, and vardenafil  riociguat This medicine may also interact with the following medications:  medicines for high blood pressure  other medicines for angina or heart failure This list may not describe all possible interactions. Give your health care provider a list of all the medicines, herbs, non-prescription drugs, or dietary supplements you use. Also tell them if you smoke, drink alcohol, or use illegal drugs. Some items may interact with your  medicine. What should I watch for while using this medicine? Check your heart rate and blood pressure regularly while you are taking this medicine. Ask your doctor or health care professional what your heart rate and blood pressure should be and when you should contact him or her. Tell your doctor or health care professional if you feel your medicine is no longer working. You may get dizzy. Do not drive, use machinery, or do anything that needs mental alertness until you know how this medicine affects you. To reduce the risk of dizzy or fainting spells, do not sit or stand up quickly, especially if you are an older patient. Alcohol can make you more dizzy, and increase flushing and rapid heartbeats. Avoid alcoholic drinks. Do not treat yourself for coughs, colds, or pain while you are taking this medicine without asking your doctor or health care professional for advice. Some ingredients may increase your blood pressure. What side effects may I notice from receiving this medicine? Side effects that you should report to your doctor or health care professional as soon as possible:  bluish discoloration of lips, fingernails, or palms of hands  irregular heartbeat, palpitations  low blood pressure  nausea, vomiting  persistent headache  unusually weak or tired Side effects that usually do not require medical attention (report to your doctor or health care professional if they continue or are bothersome):  flushing of the face or neck  rash This list may not describe all possible side effects. Call your doctor for medical advice about side effects. You may report side effects to FDA at 1-800-FDA-1088. Where should I keep my medicine? Keep out of the reach of children. Store between 15 and 30 degrees C (59 and 86 degrees F). Keep container tightly closed. Throw away any unused medicine after the expiration date. NOTE: This sheet is a summary. It may not cover all possible information. If you  have questions about this medicine, talk to your doctor, pharmacist, or health care provider.  2020 Elsevier/Gold Standard (2012-10-19 14:48:19)  Nitroglycerin sublingual tablets What is this medicine? NITROGLYCERIN (nye troe GLI ser in) is a type of vasodilator. It relaxes blood vessels, increasing the blood and oxygen supply to your heart. This medicine is used to relieve chest pain caused by angina. It is also used to prevent chest pain before activities like climbing stairs, going outdoors in cold weather, or sexual activity. This medicine may be used for other purposes; ask your health care provider or pharmacist if you have questions. COMMON BRAND NAME(S): Nitroquick, Nitrostat, Nitrotab What should I tell my health care provider before I take this medicine? They need to know if you have any of these conditions:  anemia  head injury, recent stroke, or bleeding in the brain  liver disease  previous heart attack  an unusual or allergic reaction to nitroglycerin, other medicines, foods, dyes, or preservatives  pregnant or trying to get pregnant  breast-feeding How should I use this medicine? Take this medicine by mouth as needed. At the first sign of an angina attack (chest pain or tightness) place one tablet under your tongue. You can also take this medicine 5 to 10 minutes before an event likely to produce chest pain. Follow the directions on the prescription label. Let the tablet dissolve under the tongue. Do not swallow whole. Replace the dose if you accidentally swallow it. It will help if your mouth is not dry. Saliva around the tablet will help it to dissolve more quickly. Do not eat or drink, smoke or chew tobacco while a tablet is dissolving. If you are not better within 5 minutes after taking ONE dose of nitroglycerin, call 9-1-1 immediately to seek emergency medical care. Do not take more than 3 nitroglycerin tablets over 15 minutes. If you take this medicine often to relieve  symptoms of angina, your doctor or health care professional may provide you with different instructions to manage your symptoms. If symptoms do not go away after following these instructions, it is important to call 9-1-1 immediately. Do not take more than 3 nitroglycerin tablets over 15 minutes. Talk to your pediatrician regarding the use of this medicine in children. Special care may be needed. Overdosage: If you think you have taken too much of this medicine contact a poison control center or emergency room at once. NOTE: This medicine is only for you. Do not share this medicine with others. What if I miss a dose? This does not apply. This medicine is only used as needed. What may interact with this medicine? Do not take this medicine with any of the following medications:  certain migraine medicines like ergotamine and dihydroergotamine (DHE)  medicines used to  treat erectile dysfunction like sildenafil, tadalafil, and vardenafil  riociguat This medicine may also interact with the following medications:  alteplase  aspirin  heparin  medicines for high blood pressure  medicines for mental depression  other medicines used to treat angina  phenothiazines like chlorpromazine, mesoridazine, prochlorperazine, thioridazine This list may not describe all possible interactions. Give your health care provider a list of all the medicines, herbs, non-prescription drugs, or dietary supplements you use. Also tell them if you smoke, drink alcohol, or use illegal drugs. Some items may interact with your medicine. What should I watch for while using this medicine? Tell your doctor or health care professional if you feel your medicine is no longer working. Keep this medicine with you at all times. Sit or lie down when you take your medicine to prevent falling if you feel dizzy or faint after using it. Try to remain calm. This will help you to feel better faster. If you feel dizzy, take several deep  breaths and lie down with your feet propped up, or bend forward with your head resting between your knees. You may get drowsy or dizzy. Do not drive, use machinery, or do anything that needs mental alertness until you know how this drug affects you. Do not stand or sit up quickly, especially if you are an older patient. This reduces the risk of dizzy or fainting spells. Alcohol can make you more drowsy and dizzy. Avoid alcoholic drinks. Do not treat yourself for coughs, colds, or pain while you are taking this medicine without asking your doctor or health care professional for advice. Some ingredients may increase your blood pressure. What side effects may I notice from receiving this medicine? Side effects that you should report to your doctor or health care professional as soon as possible:  blurred vision  dry mouth  skin rash  sweating  the feeling of extreme pressure in the head  unusually weak or tired Side effects that usually do not require medical attention (report to your doctor or health care professional if they continue or are bothersome):  flushing of the face or neck  headache  irregular heartbeat, palpitations  nausea, vomiting This list may not describe all possible side effects. Call your doctor for medical advice about side effects. You may report side effects to FDA at 1-800-FDA-1088. Where should I keep my medicine? Keep out of the reach of children. Store at room temperature between 20 and 25 degrees C (68 and 77 degrees F). Store in Retail buyer. Protect from light and moisture. Keep tightly closed. Throw away any unused medicine after the expiration date. NOTE: This sheet is a summary. It may not cover all possible information. If you have questions about this medicine, talk to your doctor, pharmacist, or health care provider.  2020 Elsevier/Gold Standard (2012-10-18 17:57:36)

## 2019-08-29 NOTE — Addendum Note (Signed)
Addended by: Eleonore Chiquito on: 08/29/2019 10:04 AM   Modules accepted: Orders

## 2019-08-29 NOTE — Progress Notes (Signed)
Cardiology Office Note:    Date:  08/29/2019   ID:  Cameron Bryan, DOB [birth year], MRN [id number]  PCP:  Marylen Ponto, MD  Cardiologist:  Garwin Brothers, MD   Referring MD: Marylen Ponto, MD    ASSESSMENT:    1. Essential hypertension   2. Angina pectoris (HCC)   3. Cigarette smoker   4. Mixed dyslipidemia    PLAN:    In order of problems listed above:  1. Primary prevention stressed with the patient.  Importance of compliance with diet medication stressed and he vocalized understanding. 2. Angina pectoris: Patient has significant symptoms and therefore following recommendations were made to the patient.  Sublingual nitroglycerin prescription was sent, its protocol and 911 protocol explained and the patient vocalized understanding questions were answered to the patient's satisfaction.  Will take a coated aspirin 81 mg on a daily basis.  I gave him the choice of CT coronary angiography with FFR and conventional coronary angiography and he chose the latter.I discussed coronary angiography and left heart catheterization with the patient at extensive length. Procedure, benefits and potential risks were explained. Patient had multiple questions which were answered to the patient's satisfaction. Patient agreed and consented for the procedure. Further recommendations will be made based on the findings of the coronary angiography. In the interim. The patient has any significant symptoms he knows to go to the nearest emergency room. 3. Essential hypertension: Blood pressure is elevated and I have initiated him on an isosorbide mononitrate.  Hopefully this will help his angina and his blood pressure. 4. Mixed dyslipidemia: Diet was emphasized again and he promises to do better. 5. Cigarette smoker: I spent 5 minutes with the patient discussing solely about smoking. Smoking cessation was counseled. I suggested to the patient also different medications and pharmacological interventions. Patient is  keen to try stopping on its own at this time. He will get back to me if he needs any further assistance in this matter. 6. Further recommendations will be made based on the findings of the coronary angiography.   Medication Adjustments/Labs and Tests Ordered: Current medicines are reviewed at length with the patient today.  Concerns regarding medicines are outlined above.  Orders Placed This Encounter  Procedures   EKG 12-Lead   Meds ordered this encounter  Medications   furosemide (LASIX) 20 MG tablet    Sig: Take 1 tablet (20 mg total) by mouth every other day.    Dispense:  45 tablet    Refill:  3     No chief complaint on file.    History of Present Illness:    Cameron Bryan is a 49 y.o. male.  Patient has past medical history of essential hypertension dyslipidemia and is an active cigarette smoker.  He mentions to me that he will occasionally have chest tightness and he is used nitroglycerin with relief.  No radiation to the neck or to the arms.  He leads a sedentary lifestyle.  He mentions to me that when he is sexually active he has significant dyspnea and chest tightness and he has to stop sexual activity to feel better.  No orthopnea or PND.  At the time of my evaluation, the patient is alert awake oriented and in no distress.  Past Medical History:  Diagnosis Date   Bipolar 1 disorder (HCC)    Common migraine    COPD (chronic obstructive pulmonary disease) (HCC)    Depression    GERD (gastroesophageal reflux disease)  Headache    Hypertension    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HCC)    Schizoaffective disorder (HCC)    Stroke [facility])     Past Surgical History:  Procedure Laterality Date   CYST REMOVAL HAND      Current Medications: Current Meds  Medication Sig   ALPRAZolam (XANAX) 1 MG tablet 2 (two) times daily.    amLODipine (NORVASC) 10 MG tablet Take 10 mg by mouth daily.   aspirin 325 MG tablet Take 325 mg by mouth daily.   furosemide (LASIX) 20 MG  tablet Take 1 tablet (20 mg total) by mouth every other day.   meloxicam (MOBIC) 15 MG tablet Take 15 mg by mouth daily.    metoprolol succinate (TOPROL-XL) 50 MG 24 hr tablet Take 50 mg by mouth in the morning.   olmesartan-hydrochlorothiazide (BENICAR HCT) 40-25 MG per tablet Take 1 tablet by mouth daily.   ondansetron (ZOFRAN ODT) 4 MG disintegrating tablet Take 1 tablet (4 mg total) by mouth every 8 (eight) hours as needed for nausea or vomiting.   pantoprazole (PROTONIX) 40 MG tablet Take 40 mg by mouth daily.    potassium chloride (KLOR-CON) 10 MEQ tablet 10 mEq. Take 2 in am and 2 in pm   SUMAtriptan (IMITREX) 100 MG tablet as needed.   [DISCONTINUED] furosemide (LASIX) 20 MG tablet Take 20 mg by mouth every other day.      Allergies:   Chantix [varenicline tartrate], Kenalog [triamcinolone acetonide], Lipitor [atorvastatin calcium], Oxycodone, Prednisone, and Valtrex [valacyclovir hcl]   Social History   Socioeconomic History   Marital status: Single    Spouse name: Not on file   Number of children: Not on file   Years of education: Not on file   Highest education level: Not on file  Occupational History   Not on file  Tobacco Use   Smoking status: Current Every Day Smoker    Packs/day: 1.00   Smokeless tobacco: Never Used  Substance and Sexual Activity   Alcohol use: No   Drug use: No   Sexual activity: Not on file  Other Topics Concern   Not on file  Social History Narrative   Not on file   Social Determinants of Health   Financial Resource Strain:    Difficulty of Paying Living Expenses: Not on file  Food Insecurity:    Worried About Running Out of Food in the Last Year: Not on file   Ran Out of Food in the Last Year: Not on file  Transportation Needs:    Lack of Transportation (Medical): Not on file   Lack of Transportation (Non-Medical): Not on file  Physical Activity:    Days of Exercise per Week: Not on file   Minutes of  Exercise per Session: Not on file  Stress:    Feeling of Stress : Not on file  Social Connections:    Frequency of Communication with Friends and Family: Not on file   Frequency of Social Gatherings with Friends and Family: Not on file   Attends Religious Services: Not on file   Active Member of Clubs or Organizations: Not on file   Attends Banker Meetings: Not on file   Marital Status: Not on file     Family History: The patient's family history includes Alzheimer's disease in his paternal grandfather.  ROS:   Please see the history of present illness.    All other systems reviewed and are negative.  EKGs/Labs/Other Studies Reviewed:    The following  studies were reviewed today: EKG reveals sinus rhythm and nonspecific ST-T changes.   Recent Labs: No results found for requested labs within last 8760 hours.  Recent Lipid Panel    Component Value Date/Time   CHOL 186 07/18/2018 1456   TRIG 191 (H) 07/18/2018 1456   HDL 40 07/18/2018 1456   CHOLHDL 4.7 07/18/2018 1456   LDLCALC 108 (H) 07/18/2018 1456    Physical Exam:    VS:  BP (!) 160/98    Pulse 69    Ht 5\' 9"  (1.753 m)    Wt 224 lb 3.2 oz (101.7 kg)    SpO2 97%    BMI 33.11 kg/m     Wt Readings from Last 3 Encounters:  08/29/19 224 lb 3.2 oz (101.7 kg)  02/04/19 224 lb (101.6 kg)  12/04/18 217 lb (98.4 kg)     GEN: Patient is in no acute distress HEENT: Normal NECK: No JVD; No carotid bruits LYMPHATICS: No lymphadenopathy CARDIAC: Hear sounds regular, 2/6 systolic murmur at the apex. RESPIRATORY:  Clear to auscultation without rales, wheezing or rhonchi  ABDOMEN: Soft, non-tender, non-distended MUSCULOSKELETAL:  No edema; No deformity  SKIN: Warm and dry NEUROLOGIC:  Alert and oriented x 3 PSYCHIATRIC:  Normal affect   Signed, 14/01/20, MD  08/29/2019 9:39 AM    [facility]

## 2019-08-29 NOTE — H&P (View-Only) (Signed)
°Cardiology Office Note:   ° °Date:  08/29/2019  ° °ID:  [name], DOB 05/08/1970, MRN [id number] ° °PCP:  Holt, Lynley S, MD  °Cardiologist:  Lorea Kupfer R Gazelle Towe, MD  ° °Referring MD: Holt, Lynley S, MD  ° ° °ASSESSMENT:   ° °1. Essential hypertension   °2. Angina pectoris (HCC)   °3. Cigarette smoker   °4. Mixed dyslipidemia   ° °PLAN:   ° °In order of problems listed above: ° °1. Primary prevention stressed with the patient.  Importance of compliance with diet medication stressed and he vocalized understanding. °2. Angina pectoris: Patient has significant symptoms and therefore following recommendations were made to the patient.  Sublingual nitroglycerin prescription was sent, its protocol and 911 protocol explained and the patient vocalized understanding questions were answered to the patient's satisfaction.  Will take a coated aspirin 81 mg on a daily basis.  I gave him the choice of CT coronary angiography with FFR and conventional coronary angiography and he chose the latter.I discussed coronary angiography and left heart catheterization with the patient at extensive length. Procedure, benefits and potential risks were explained. Patient had multiple questions which were answered to the patient's satisfaction. Patient agreed and consented for the procedure. Further recommendations will be made based on the findings of the coronary angiography. In the interim. The patient has any significant symptoms he knows to go to the nearest emergency room. °3. Essential hypertension: Blood pressure is elevated and I have initiated him on an isosorbide mononitrate.  Hopefully this will help his angina and his blood pressure. °4. Mixed dyslipidemia: Diet was emphasized again and he promises to do better. °5. Cigarette smoker: I spent 5 minutes with the patient discussing solely about smoking. Smoking cessation was counseled. I suggested to the patient also different medications and pharmacological interventions. Patient is  keen to try stopping on its own at this time. He will get back to me if he needs any further assistance in this matter. °6. Further recommendations will be made based on the findings of the coronary angiography. ° ° °Medication Adjustments/Labs and Tests Ordered: °Current medicines are reviewed at length with the patient today.  Concerns regarding medicines are outlined above.  °Orders Placed This Encounter  °Procedures  °• EKG 12-Lead  ° °Meds ordered this encounter  °Medications  °• furosemide (LASIX) 20 MG tablet  °  Sig: Take 1 tablet (20 mg total) by mouth every other day.  °  Dispense:  45 tablet  °  Refill:  3  ° ° ° °No chief complaint on file. °  ° °History of Present Illness:   ° °Cameron Bryan is a 48 y.o. male.  Patient has past medical history of essential hypertension dyslipidemia and is an active cigarette smoker.  He mentions to me that he will occasionally have chest tightness and he is used nitroglycerin with relief.  No radiation to the neck or to the arms.  He leads a sedentary lifestyle.  He mentions to me that when he is sexually active he has significant dyspnea and chest tightness and he has to stop sexual activity to feel better.  No orthopnea or PND.  At the time of my evaluation, the patient is alert awake oriented and in no distress. ° °Past Medical History:  °Diagnosis Date  °• Bipolar 1 disorder (HCC)   °• Common migraine   °• COPD (chronic obstructive pulmonary disease) (HCC)   °• Depression   °• GERD (gastroesophageal reflux disease)   °•   Headache   °• Hypertension   °• 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HCC)   °• Schizoaffective disorder (HCC)   °• Stroke (HCC)   ° ° °Past Surgical History:  °Procedure Laterality Date  °• CYST REMOVAL HAND    ° ° °Current Medications: °Current Meds  °Medication Sig  °• ALPRAZolam (XANAX) 1 MG tablet 2 (two) times daily.   °• amLODipine (NORVASC) 10 MG tablet Take 10 mg by mouth daily.  °• aspirin 325 MG tablet Take 325 mg by mouth daily.  °• furosemide (LASIX) 20 MG  tablet Take 1 tablet (20 mg total) by mouth every other day.  °• meloxicam (MOBIC) 15 MG tablet Take 15 mg by mouth daily.   °• metoprolol succinate (TOPROL-XL) 50 MG 24 hr tablet Take 50 mg by mouth in the morning.  °• olmesartan-hydrochlorothiazide (BENICAR HCT) 40-25 MG per tablet Take 1 tablet by mouth daily.  °• ondansetron (ZOFRAN ODT) 4 MG disintegrating tablet Take 1 tablet (4 mg total) by mouth every 8 (eight) hours as needed for nausea or vomiting.  °• pantoprazole (PROTONIX) 40 MG tablet Take 40 mg by mouth daily.   °• potassium chloride (KLOR-CON) 10 MEQ tablet 10 mEq. Take 2 in am and 2 in pm  °• SUMAtriptan (IMITREX) 100 MG tablet as needed.  °• [DISCONTINUED] furosemide (LASIX) 20 MG tablet Take 20 mg by mouth every other day.   °  ° °Allergies:   Chantix [varenicline tartrate], Kenalog [triamcinolone acetonide], Lipitor [atorvastatin calcium], Oxycodone, Prednisone, and Valtrex [valacyclovir hcl]  ° °Social History  ° °Socioeconomic History  °• Marital status: Single  °  Spouse name: Not on file  °• Number of children: Not on file  °• Years of education: Not on file  °• Highest education level: Not on file  °Occupational History  °• Not on file  °Tobacco Use  °• Smoking status: Current Every Day Smoker  °  Packs/day: 1.00  °• Smokeless tobacco: Never Used  °Substance and Sexual Activity  °• Alcohol use: No  °• Drug use: No  °• Sexual activity: Not on file  °Other Topics Concern  °• Not on file  °Social History Narrative  °• Not on file  ° °Social Determinants of Health  ° °Financial Resource Strain:   °• Difficulty of Paying Living Expenses: Not on file  °Food Insecurity:   °• Worried About Running Out of Food in the Last Year: Not on file  °• Ran Out of Food in the Last Year: Not on file  °Transportation Needs:   °• Lack of Transportation (Medical): Not on file  °• Lack of Transportation (Non-Medical): Not on file  °Physical Activity:   °• Days of Exercise per Week: Not on file  °• Minutes of  Exercise per Session: Not on file  °Stress:   °• Feeling of Stress : Not on file  °Social Connections:   °• Frequency of Communication with Friends and Family: Not on file  °• Frequency of Social Gatherings with Friends and Family: Not on file  °• Attends Religious Services: Not on file  °• Active Member of Clubs or Organizations: Not on file  °• Attends Club or Organization Meetings: Not on file  °• Marital Status: Not on file  °  ° °Family History: °The patient's family history includes Alzheimer's disease in his paternal grandfather. ° °ROS:   °Please see the history of present illness.    °All other systems reviewed and are negative. ° °EKGs/Labs/Other Studies Reviewed:   ° °The following   studies were reviewed today: °EKG reveals sinus rhythm and nonspecific ST-T changes. ° ° °Recent Labs: °No results found for requested labs within last 8760 hours.  °Recent Lipid Panel °   °Component Value Date/Time  ° CHOL 186 07/18/2018 1456  ° TRIG 191 (H) 07/18/2018 1456  ° HDL 40 07/18/2018 1456  ° CHOLHDL 4.7 07/18/2018 1456  ° LDLCALC 108 (H) 07/18/2018 1456  ° ° °Physical Exam:   ° °VS:  BP (!) 160/98    Pulse 69    Ht 5' 9" (1.753 m)    Wt 224 lb 3.2 oz (101.7 kg)    SpO2 97%    BMI 33.11 kg/m²    ° °Wt Readings from Last 3 Encounters:  °08/29/19 224 lb 3.2 oz (101.7 kg)  °02/04/19 224 lb (101.6 kg)  °12/04/18 217 lb (98.4 kg)  °  ° °GEN: Patient is in no acute distress °HEENT: Normal °NECK: No JVD; No carotid bruits °LYMPHATICS: No lymphadenopathy °CARDIAC: Hear sounds regular, 2/6 systolic murmur at the apex. °RESPIRATORY:  Clear to auscultation without rales, wheezing or rhonchi  °ABDOMEN: Soft, non-tender, non-distended °MUSCULOSKELETAL:  No edema; No deformity  °SKIN: Warm and dry °NEUROLOGIC:  Alert and oriented x 3 °PSYCHIATRIC:  Normal affect  ° °Signed, °Aretha Levi R Tycho Cheramie, MD  °08/29/2019 9:39 AM    °[facility]  °

## 2019-08-30 ENCOUNTER — Telehealth: Payer: Self-pay | Admitting: Cardiology

## 2019-08-30 LAB — CBC WITH DIFFERENTIAL/PLATELET
Basophils Absolute: 0 10*3/uL (ref 0.0–0.2)
Basos: 1 %
EOS (ABSOLUTE): 0.2 10*3/uL (ref 0.0–0.4)
Eos: 3 %
Hematocrit: 48.3 % (ref 37.5–51.0)
Hemoglobin: 16.3 g/dL (ref 13.0–17.7)
Immature Grans (Abs): 0 10*3/uL (ref 0.0–0.1)
Immature Granulocytes: 0 %
Lymphocytes Absolute: 2.1 10*3/uL (ref 0.7–3.1)
Lymphs: 32 %
MCH: 30.7 pg (ref 26.6–33.0)
MCHC: 33.7 g/dL (ref 31.5–35.7)
MCV: 91 fL (ref 79–97)
Monocytes Absolute: 0.9 10*3/uL (ref 0.1–0.9)
Monocytes: 13 %
Neutrophils Absolute: 3.3 10*3/uL (ref 1.4–7.0)
Neutrophils: 51 %
Platelets: 262 10*3/uL (ref 150–450)
RBC: 5.31 x10E6/uL (ref 4.14–5.80)
RDW: 12.6 % (ref 11.6–15.4)
WBC: 6.5 10*3/uL (ref 3.4–10.8)

## 2019-08-30 LAB — BASIC METABOLIC PANEL
BUN/Creatinine Ratio: 14 (ref 9–20)
BUN: 13 mg/dL (ref 6–24)
CO2: 29 mmol/L (ref 20–29)
Calcium: 9.8 mg/dL (ref 8.7–10.2)
Chloride: 96 mmol/L (ref 96–106)
Creatinine, Ser: 0.95 mg/dL (ref 0.76–1.27)
GFR calc Af Amer: 110 mL/min/{1.73_m2} (ref >59)
GFR calc non Af Amer: 95 mL/min/{1.73_m2} (ref >59)
Glucose: 93 mg/dL (ref 65–99)
Potassium: 3.8 mmol/L (ref 3.5–5.2)
Sodium: 139 mmol/L (ref 134–144)

## 2019-08-30 NOTE — Telephone Encounter (Signed)
Pt c/o medication issue:  1. Name of Medication: isosorbide mononitrate (IMDUR) 60 MG 24 hr tablet  2. How are you currently taking this medication (dosage and times per day)? As written  3. Are you having a reaction (difficulty breathing--STAT)? No  4. What is your medication issue? Patients wife says that husband gets a really bad headache from using medication. Wants to know if he has any other medication he could take besides that one. Please call [phone]

## 2019-08-30 NOTE — Telephone Encounter (Signed)
Best to stop medicine in that case

## 2019-09-02 NOTE — Telephone Encounter (Signed)
Per DPR detailed message left on VM with recommendations as reviewed by Dr. Revankar.  

## 2019-09-03 ENCOUNTER — Other Ambulatory Visit (HOSPITAL_COMMUNITY)
Admission: RE | Admit: 2019-09-03 | Discharge: 2019-09-03 | Disposition: A | Discharge status: Home / Self Care | Payer: Medicare Other | Source: Ambulatory Visit | Attending: Interventional Cardiology | Admitting: Interventional Cardiology

## 2019-09-03 DIAGNOSIS — Z20822 Contact with and (suspected) exposure to covid-19: Secondary | ICD-10-CM | POA: Insufficient documentation

## 2019-09-03 DIAGNOSIS — Z01812 Encounter for preprocedural laboratory examination: Secondary | ICD-10-CM | POA: Diagnosis not present

## 2019-09-03 LAB — SARS CORONAVIRUS 2 (TAT 6-24 HRS): SARS Coronavirus 2: NEGATIVE

## 2019-09-04 ENCOUNTER — Telehealth: Payer: Self-pay | Admitting: *Deleted

## 2019-09-04 NOTE — Telephone Encounter (Addendum)
Pt contacted pre-catheterization scheduled at [facility] for: Thursday September 05, 2019 10:30 AM Verified arrival time and place: [facility] Main Entrance A [facility]) at: 8:30 AM   No solid food after midnight prior to cath, clear liquids until 5 AM day of procedure.  Hold: Lasix/KCl-AM of procedure Olmesartan-HCTZ-AM of procedure  Except hold medications AM meds can be  taken pre-cath with sips of water including: ASA 81 mg   Confirmed patient has responsible adult to drive home post procedure and be with patient first 24 hours after arriving home: yes  You are allowed ONE visitor in the waiting room during the time you are at the hospital for your procedure. Both you and your visitor must wear a mask once you enter the hospital.       COVID-19 Pre-Screening Questions:  . In the past 10 days have you had a new cough, shortness of breath, headache, congestion, fever (100 or greater) unexplained body aches, new sore throat, or sudden loss of taste or sense of smell? no . In the past 10 days have you been around anyone with known Covid 19? * . Have you been vaccinated for COVID-19? no     *Pt states 08/30/19 his wife and another household member were in close contact with someone that tested positive for COVID-19.               Pt states he has been asymptomatic, his wife had headache yesterday but no other symptoms.              After discussion with patient, [facility] has been rescheduled with to  Monday September 16, 2019 arrive 8:30 AM for 10:30 AM Dr Katrinka Blazing , pre-procedure COVID-19 has been scheduled for 09/14/19 11:45 AM.              Pt agrees with decision to reschedule [facility], especially since his wife would be his transportation home and person to be with him at home after procedure.             Pt states he has not had COVID-19 vaccination, knows COVID-19 test yesterday was negative.              Pt and household members will quarantine at home 14 days after close contact  with COVID positive person on 08/30/19, monitor for symptoms of COVID-19, notify PCP if develops symptoms.           Pt knows to notify our office if he develops symptoms of/tests positive for COVID-19 before [facility] 09/16/19.

## 2019-09-04 NOTE — Telephone Encounter (Addendum)
I reviewed updated [facility] instructions with patient earlier today.  Pt contacted pre-catheterization scheduled at [facility] for: Monday September 16, 2019 10:30 AM Verified arrival time and place: [facility] Main Entrance A [facility]) at: 8:30 AM   No solid food after midnight prior to cath, clear liquids until 5 AM day of procedure.  Hold: Lasix/KCl-day of procedure Olmesartan-HCTZ-AM of procedure  Except hold medications AM meds can be  taken pre-cath with sips of water including: ASA 81 mg   Confirmed patient has responsible adult to drive home post procedure and be with patient first 24 hours after arriving home:yes  You are allowed ONE visitor in the waiting room during the time you are at the hospital for your procedure. Both you and your visitor must wear a mask once you enter the hospital.

## 2019-09-04 NOTE — Telephone Encounter (Signed)
Patient returning call.

## 2019-09-12 NOTE — Telephone Encounter (Signed)
Patient is returning call to discuss instructions. 

## 2019-09-12 NOTE — Telephone Encounter (Signed)
Spoke with the pt and he verbalized understanding of his instructions and his COVID test 09/14/19. He has been in quarantine and feeling well with no symptoms of COVID.

## 2019-09-12 NOTE — Telephone Encounter (Signed)
Left message for the pt to call back to go over his intructions as his cath for 09/16/19 gets closer and to remind him of his 09/14/19 COVID test.

## 2019-09-14 ENCOUNTER — Other Ambulatory Visit (HOSPITAL_COMMUNITY)
Admission: RE | Admit: 2019-09-14 | Discharge: 2019-09-14 | Disposition: A | Discharge status: Home / Self Care | Payer: Medicare Other | Source: Ambulatory Visit | Attending: Interventional Cardiology | Admitting: Interventional Cardiology

## 2019-09-14 DIAGNOSIS — Z20822 Contact with and (suspected) exposure to covid-19: Secondary | ICD-10-CM | POA: Diagnosis not present

## 2019-09-14 DIAGNOSIS — Z01818 Encounter for other preprocedural examination: Secondary | ICD-10-CM | POA: Insufficient documentation

## 2019-09-14 LAB — SARS CORONAVIRUS 2 (TAT 6-24 HRS): SARS Coronavirus 2: NEGATIVE

## 2019-09-15 NOTE — H&P (Signed)
Negative Nuclear 1 year ago. Recent angina relieved with SL NTG. Cath recommended.

## 2019-09-16 ENCOUNTER — Ambulatory Visit (HOSPITAL_COMMUNITY)
Admission: RE | Admit: 2019-09-16 | Discharge: 2019-09-16 | Disposition: A | Discharge status: Home / Self Care | Payer: Medicare Other | Attending: Interventional Cardiology | Admitting: Interventional Cardiology

## 2019-09-16 ENCOUNTER — Ambulatory Visit (HOSPITAL_COMMUNITY)
Admission: RE | Disposition: A | Discharge status: Home / Self Care | Payer: Self-pay | Source: Home / Self Care | Attending: Interventional Cardiology

## 2019-09-16 ENCOUNTER — Other Ambulatory Visit: Payer: Self-pay

## 2019-09-16 DIAGNOSIS — Z8673 Personal history of transient ischemic attack (TIA), and cerebral infarction without residual deficits: Secondary | ICD-10-CM | POA: Diagnosis not present

## 2019-09-16 DIAGNOSIS — Z79899 Other long term (current) drug therapy: Secondary | ICD-10-CM | POA: Insufficient documentation

## 2019-09-16 DIAGNOSIS — E782 Mixed hyperlipidemia: Secondary | ICD-10-CM | POA: Diagnosis not present

## 2019-09-16 DIAGNOSIS — I25119 Atherosclerotic heart disease of native coronary artery with unspecified angina pectoris: Secondary | ICD-10-CM

## 2019-09-16 DIAGNOSIS — Z888 Allergy status to other drugs, medicaments and biological substances status: Secondary | ICD-10-CM | POA: Insufficient documentation

## 2019-09-16 DIAGNOSIS — Z95820 Peripheral vascular angioplasty status with implants and grafts: Secondary | ICD-10-CM

## 2019-09-16 DIAGNOSIS — J449 Chronic obstructive pulmonary disease, unspecified: Secondary | ICD-10-CM | POA: Insufficient documentation

## 2019-09-16 DIAGNOSIS — Z885 Allergy status to narcotic agent status: Secondary | ICD-10-CM | POA: Diagnosis not present

## 2019-09-16 DIAGNOSIS — F319 Bipolar disorder, unspecified: Secondary | ICD-10-CM | POA: Insufficient documentation

## 2019-09-16 DIAGNOSIS — I209 Angina pectoris, unspecified: Secondary | ICD-10-CM

## 2019-09-16 DIAGNOSIS — I10 Essential (primary) hypertension: Secondary | ICD-10-CM | POA: Diagnosis present

## 2019-09-16 DIAGNOSIS — K219 Gastro-esophageal reflux disease without esophagitis: Secondary | ICD-10-CM | POA: Insufficient documentation

## 2019-09-16 DIAGNOSIS — F17210 Nicotine dependence, cigarettes, uncomplicated: Secondary | ICD-10-CM | POA: Diagnosis present

## 2019-09-16 DIAGNOSIS — Z791 Long term (current) use of non-steroidal anti-inflammatories (NSAID): Secondary | ICD-10-CM | POA: Insufficient documentation

## 2019-09-16 DIAGNOSIS — R0789 Other chest pain: Secondary | ICD-10-CM | POA: Diagnosis present

## 2019-09-16 DIAGNOSIS — F259 Schizoaffective disorder, unspecified: Secondary | ICD-10-CM | POA: Diagnosis present

## 2019-09-16 HISTORY — PX: CORONARY STENT INTERVENTION: CATH118234

## 2019-09-16 HISTORY — PX: LEFT HEART CATH AND CORONARY ANGIOGRAPHY: CATH118249

## 2019-09-16 LAB — POCT ACTIVATED CLOTTING TIME
Activated Clotting Time: 329 seconds
Activated Clotting Time: 378 seconds
Activated Clotting Time: 257 seconds

## 2019-09-16 SURGERY — LEFT HEART CATH AND CORONARY ANGIOGRAPHY
Anesthesia: LOCAL

## 2019-09-16 MED ORDER — HEPARIN SODIUM (PORCINE) 1000 UNIT/ML IJ SOLN
INTRAMUSCULAR | Status: AC
  Filled 2019-09-16: qty 1

## 2019-09-16 MED ORDER — NITROGLYCERIN IN D5W 200-5 MCG/ML-% IV SOLN
INTRAVENOUS | Status: AC | PRN
  Administered 2019-09-16: 10 ug/min via INTRAVENOUS

## 2019-09-16 MED ORDER — CLOPIDOGREL BISULFATE 75 MG PO TABS
75.0000 mg | ORAL_TABLET | Freq: Every day | ORAL | 2 refills | Status: DC
Start: 2019-09-17 — End: 2020-06-23

## 2019-09-16 MED ORDER — CLOPIDOGREL BISULFATE 300 MG PO TABS
ORAL_TABLET | ORAL | Status: DC | PRN
  Administered 2019-09-16: 600 mg via ORAL

## 2019-09-16 MED ORDER — ACETAMINOPHEN 325 MG PO TABS: 650.0000 mg | ORAL_TABLET | ORAL | Status: DC | PRN

## 2019-09-16 MED ORDER — FENTANYL CITRATE (PF) 100 MCG/2ML IJ SOLN
INTRAMUSCULAR | Status: DC | PRN
Start: 2019-09-16 — End: 2019-09-16
  Administered 2019-09-16 (×2): 25 ug via INTRAVENOUS

## 2019-09-16 MED ORDER — SODIUM CHLORIDE 0.9 % IV SOLN: INTRAVENOUS | Status: AC

## 2019-09-16 MED ORDER — ROSUVASTATIN CALCIUM 20 MG PO TABS: 20.0000 mg | ORAL_TABLET | Freq: Every day | ORAL | 11 refills | Status: DC

## 2019-09-16 MED ORDER — VERAPAMIL HCL 2.5 MG/ML IV SOLN
INTRAVENOUS | Status: DC | PRN
  Administered 2019-09-16: 10 mL via INTRA_ARTERIAL

## 2019-09-16 MED ORDER — CLOPIDOGREL BISULFATE 300 MG PO TABS
ORAL_TABLET | ORAL | Status: AC
  Filled 2019-09-16: qty 2

## 2019-09-16 MED ORDER — SODIUM CHLORIDE 0.9% FLUSH: 3.0000 mL | Freq: Two times a day (BID) | INTRAVENOUS | Status: DC

## 2019-09-16 MED ORDER — ASPIRIN 81 MG PO CHEW: 81.0000 mg | CHEWABLE_TABLET | ORAL | Status: DC

## 2019-09-16 MED ORDER — FENTANYL CITRATE (PF) 100 MCG/2ML IJ SOLN
INTRAMUSCULAR | Status: AC
  Filled 2019-09-16: qty 2

## 2019-09-16 MED ORDER — SODIUM CHLORIDE 0.9% FLUSH: 3.0000 mL | INTRAVENOUS | Status: DC | PRN

## 2019-09-16 MED ORDER — ROSUVASTATIN CALCIUM 20 MG PO TABS
20.0000 mg | ORAL_TABLET | Freq: Every day | ORAL | Status: DC
  Filled 2019-09-16: qty 1

## 2019-09-16 MED ORDER — ALPRAZOLAM 0.5 MG PO TABS
1.0000 mg | ORAL_TABLET | ORAL | Status: AC
  Administered 2019-09-16: 1 mg via ORAL
  Filled 2019-09-16: qty 2

## 2019-09-16 MED ORDER — SODIUM CHLORIDE 0.9 % WEIGHT BASED INFUSION
1.0000 mL/kg/h | INTRAVENOUS | Status: DC
  Administered 2019-09-16: 1 mL/kg/h via INTRAVENOUS

## 2019-09-16 MED ORDER — HEPARIN (PORCINE) IN NACL 1000-0.9 UT/500ML-% IV SOLN
INTRAVENOUS | Status: AC
  Filled 2019-09-16: qty 1000

## 2019-09-16 MED ORDER — HYDRALAZINE HCL 20 MG/ML IJ SOLN: 10.0000 mg | INTRAMUSCULAR | Status: DC | PRN

## 2019-09-16 MED ORDER — SODIUM CHLORIDE 0.9 % IV SOLN: 250.0000 mL | INTRAVENOUS | Status: DC | PRN

## 2019-09-16 MED ORDER — OXYCODONE HCL 5 MG PO TABS: 5.0000 mg | ORAL_TABLET | ORAL | Status: DC | PRN

## 2019-09-16 MED ORDER — HEPARIN SODIUM (PORCINE) 1000 UNIT/ML IJ SOLN
INTRAMUSCULAR | Status: DC | PRN
  Administered 2019-09-16: 3000 [IU] via INTRAVENOUS
  Administered 2019-09-16 (×2): 5000 [IU] via INTRAVENOUS
  Administered 2019-09-16: 2000 [IU] via INTRAVENOUS

## 2019-09-16 MED ORDER — MIDAZOLAM HCL 2 MG/2ML IJ SOLN
INTRAMUSCULAR | Status: DC | PRN
  Administered 2019-09-16 (×2): 1 mg via INTRAVENOUS

## 2019-09-16 MED ORDER — HYDRALAZINE HCL 20 MG/ML IJ SOLN
INTRAMUSCULAR | Status: AC
  Filled 2019-09-16: qty 1

## 2019-09-16 MED ORDER — MIDAZOLAM HCL 2 MG/2ML IJ SOLN
INTRAMUSCULAR | Status: AC
  Filled 2019-09-16: qty 2

## 2019-09-16 MED ORDER — LIDOCAINE HCL (PF) 1 % IJ SOLN
INTRAMUSCULAR | Status: AC
  Filled 2019-09-16: qty 30

## 2019-09-16 MED ORDER — LIDOCAINE HCL (PF) 1 % IJ SOLN
INTRAMUSCULAR | Status: DC | PRN
  Administered 2019-09-16: 2 mL via INTRADERMAL

## 2019-09-16 MED ORDER — HYDRALAZINE HCL 20 MG/ML IJ SOLN
INTRAMUSCULAR | Status: DC | PRN
  Administered 2019-09-16 (×2): 10 mg via INTRAVENOUS

## 2019-09-16 MED ORDER — ASPIRIN EC 81 MG PO TBEC: 81.0000 mg | DELAYED_RELEASE_TABLET | Freq: Every day | ORAL | 2 refills | Status: DC

## 2019-09-16 MED ORDER — LABETALOL HCL 5 MG/ML IV SOLN
10.0000 mg | INTRAVENOUS | Status: DC | PRN
  Administered 2019-09-16: 10 mg via INTRAVENOUS
  Filled 2019-09-16: qty 4

## 2019-09-16 MED ORDER — SODIUM CHLORIDE 0.9 % WEIGHT BASED INFUSION
3.0000 mL/kg/h | INTRAVENOUS | Status: AC
  Administered 2019-09-16: 3 mL/kg/h via INTRAVENOUS

## 2019-09-16 MED ORDER — CLOPIDOGREL BISULFATE 75 MG PO TABS: 75.0000 mg | ORAL_TABLET | Freq: Every day | ORAL | Status: DC

## 2019-09-16 MED ORDER — IOHEXOL 350 MG/ML SOLN
INTRAVENOUS | Status: DC | PRN
  Administered 2019-09-16: 290 mL via INTRA_ARTERIAL

## 2019-09-16 MED ORDER — NITROGLYCERIN IN D5W 200-5 MCG/ML-% IV SOLN
INTRAVENOUS | Status: AC
  Filled 2019-09-16: qty 250

## 2019-09-16 MED ORDER — ONDANSETRON HCL 4 MG/2ML IJ SOLN: 4.0000 mg | Freq: Four times a day (QID) | INTRAMUSCULAR | Status: DC | PRN

## 2019-09-16 MED ORDER — VERAPAMIL HCL 2.5 MG/ML IV SOLN
INTRAVENOUS | Status: AC
  Filled 2019-09-16: qty 2

## 2019-09-16 MED ORDER — FAMOTIDINE IN NACL 20-0.9 MG/50ML-% IV SOLN
INTRAVENOUS | Status: AC
  Filled 2019-09-16: qty 50

## 2019-09-16 MED ORDER — ASPIRIN 81 MG PO CHEW: 81.0000 mg | CHEWABLE_TABLET | Freq: Every day | ORAL | Status: DC

## 2019-09-16 MED FILL — CLOPIDOGREL 75 MG TABLET: 75 | 90 days supply | Qty: 90 | Fill #0

## 2019-09-16 MED FILL — ASPIRIN LOW DOSE 81 MG TBEC: 81 | 90 days supply | Qty: 90 | Fill #0

## 2019-09-16 SURGICAL SUPPLY — 21 items
BALLN MINITREK OTW 2.0X12 (BALLOONS) ×2
BALLN SAPPHIRE ~~LOC~~ 2.5X12 (BALLOONS) ×1 IMPLANT
BALLOON MINITREK OTW 2.0X12 (BALLOONS) IMPLANT
CATH INFINITI 5 FR JL3.5 (CATHETERS) ×1 IMPLANT
CATH INFINITI JR4 5F (CATHETERS) ×1 IMPLANT
CATH SUPERCROSS ANGLED 90 DEG (MICROCATHETER) ×1 IMPLANT
CATH VISTA GUIDE 6FR XB3.5 (CATHETERS) ×1 IMPLANT
DEVICE RAD COMP TR BAND LRG (VASCULAR PRODUCTS) ×1 IMPLANT
GLIDESHEATH SLEND A-KIT 6F 22G (SHEATH) ×1 IMPLANT
GUIDEWIRE INQWIRE 1.5J.035X260 (WIRE) IMPLANT
GUIDEWIRE PRESSURE COMET II (WIRE) ×1 IMPLANT
INQWIRE 1.5J .035X260CM (WIRE) ×2
KIT ENCORE 26 ADVANTAGE (KITS) ×1 IMPLANT
KIT HEART LEFT (KITS) ×2 IMPLANT
KIT HEMO VALVE WATCHDOG (MISCELLANEOUS) ×1 IMPLANT
PACK CARDIAC CATHETERIZATION (CUSTOM PROCEDURE TRAY) ×2 IMPLANT
STENT RESOLUTE ONYX 2.0X18 (Permanent Stent) ×1 IMPLANT
TRANSDUCER W/STOPCOCK (MISCELLANEOUS) ×2 IMPLANT
TUBING CIL FLEX 10 FLL-RA (TUBING) ×2 IMPLANT
WIRE ASAHI PROWATER 180CM (WIRE) ×1 IMPLANT
WIRE ASAHI PROWATER 300CM (WIRE) ×1 IMPLANT

## 2019-09-16 NOTE — Interval H&P Note (Signed)
Cath Lab Visit (complete for each Cath Lab visit)  Clinical Evaluation Leading to the Procedure:   ACS: No.  Non-ACS:    Anginal Classification: CCS III  Anti-ischemic medical therapy: Maximal Therapy (2 or more classes of medications)  Non-Invasive Test Results: Low-risk stress test findings: cardiac mortality <1%/year  Prior CABG: No previous CABG      History and Physical Interval Note:  09/16/2019 11:11 AM  Cameron Bryan  has presented today for surgery, with the diagnosis of angina.  The various methods of treatment have been discussed with the patient and family. After consideration of risks, benefits and other options for treatment, the patient has consented to  Procedure(s): LEFT HEART CATH AND CORONARY ANGIOGRAPHY (N/A) as a surgical intervention.  The patient's history has been reviewed, patient examined, no change in status, stable for surgery.  I have reviewed the patient's chart and labs.  Questions were answered to the patient's satisfaction.     Lyn Records III

## 2019-09-16 NOTE — Discharge Instructions (Signed)
DRINK PLENTY OF FLUIDS FOR THE NEXT 2-3 DAYS.  KEEP ARM ELEVATED THE REMAINDER OF THE DAY.  Radial Site Care  This sheet gives you information about how to care for yourself after your procedure. Your health care provider may also give you more specific instructions. If you have problems or questions, contact your health care provider. What can I expect after the procedure? After the procedure, it is common to have:  Bruising and tenderness at the catheter insertion area. Follow these instructions at home: Medicines  Take over-the-counter and prescription medicines only as told by your health care provider. Insertion site care 1. Follow instructions from your health care provider about how to take care of your insertion site. Make sure you: ? Wash your hands with soap and water before you change your bandage (dressing). If soap and water are not available, use hand sanitizer. ? Change your dressing as told by your health care provider. 2. Check your insertion site every day for signs of infection. Check for: ? Redness, swelling, or pain. ? Fluid or blood. ? Pus or a bad smell. ? Warmth. 3. Do not take baths, swim, or use a hot tub for 5 days. 4. You may shower 24-48 hours after the procedure. ? Remove the dressing and gently wash the site with plain soap and water. ? Pat the area dry with a clean towel. ? Do not rub the site. That could cause bleeding. 5. Do not apply powder or lotion to the site. Activity  1. For 24 hours after the procedure, or as directed by your health care provider: ? Do not flex or bend the affected arm. ? Do not push or pull heavy objects with the affected arm. ? Do not drive yourself home from the hospital or clinic. You may drive 24 hours after the procedure. ? Do not operate machinery or power tools. 2. Do not push, pull or lift anything that is heavier than 10 lb for 5 days. 3. Ask your health care provider when it is okay to: ? Return to work or  school. ? Resume usual physical activities or sports. ? Resume sexual activity. General instructions  If the catheter site starts to bleed, raise your arm and put firm pressure on the site. If the bleeding does not stop, get help right away. This is a medical emergency.  If you went home on the same day as your procedure, a responsible adult should be with you for the first 24 hours after you arrive home.  Keep all follow-up visits as told by your health care provider. This is important. Contact a health care provider if:  You have a fever.  You have redness, swelling, or yellow drainage around your insertion site. Get help right away if:  You have unusual pain at the radial site.  The catheter insertion area swells very fast.  The insertion area is bleeding, and the bleeding does not stop when you hold steady pressure on the area.  Your arm or hand becomes pale, cool, tingly, or numb. These symptoms may represent a serious problem that is an emergency. Do not wait to see if the symptoms will go away. Get medical help right away. Call your local emergency services (911 in the [place] Do not drive yourself to the hospital. Summary  After the procedure, it is common to have bruising and tenderness at the site.  Follow instructions from your health care provider about how to take care of your radial site wound. Check   the wound every day for signs of infection.  Do not push, pull or lift anything that is heavier than 10 lb for 5 days.  This information is not intended to replace advice given to you by your health care provider. Make sure you discuss any questions you have with your health care provider. Document Revised: 01/25/2017 Document Reviewed: 01/25/2017 Elsevier Patient Education  2020 [employer] 

## 2019-09-16 NOTE — Discharge Summary (Addendum)
The patient has been seen in conjunction with Laverda Page, NP. All aspects of care have been considered and discussed. The patient has been personally interviewed, examined, and all clinical data has been reviewed.  Had PCI of severe distal circumflex treated with DES. DFR on LAD was .89, and therefore PCI deferred. Radial access site without bleeding. Home today with aggressive RFM (Crestor 20 mg daily) If continued angina after better BP control and with good preventive therapy, consder LAD PCI.     Discharge Summary for Same Day PCI   Patient ID: Cameron Bryan MRN: [id number]; DOB: 29-Dec-1970  Admit date: 09/16/2019 Discharge date: 09/16/2019  Primary Care Provider: Marylen Ponto, MD  Primary Cardiologist: Garwin Brothers, MD  Primary Electrophysiologist:  None   Discharge Diagnoses    Principal Problem:   Chest tightness Active Problems:   Schizoaffective disorder [facility])   Hypertension   Cigarette smoker    Diagnostic Studies/Procedures    Cardiac Catheterization 09/16/2019:  Left main is widely patent The LAD contains a 50% eccentric stenosis.  It forms a [place] 110 bifurcation stenosis with a first diagonal.  DFR on the LAD lesion was 0.89. Distal circumflex is 99% stenosed followed by 2 tiny obtuse marginal branches.  PCI was performed with some difficulty due to angulation of the circumflex marginal vein greater than 90 degrees.  The stenosis was reduced to 0% with TIMI grade III flow and no evidence of dissection or wire perforation.  Patient was asymptomatic throughout the procedure. The right coronary is large and free of obstructive disease. Left ventricle is normal in size and demonstrates normal contractility with EF 60%. Blood pressure was extremely elevated during the procedure requiring IV nitroglycerin, hydralazine, and sedation to control.   RECOMMENDATIONS:   Aspirin and Plavix for 6 months. The LAD has a borderline stenosis.  The patient needs  aggressive blood pressure control.  DFR was borderline at 0.89.  If symptoms persist post circumflex stenting, consider PCI on LAD although it was difficult to recommend this given hemodynamic data. Risk factor modification.  Rosuvastatin 20 mg/day was started. Same-day discharge later this evening assuming no complications, chest pain, or other complaints. Blood pressure must be under control before discharge.  Diagnostic Dominance: Right  Intervention    _____________   History of Present Illness     Cameron Bryan is a 49 y.o. male with past medical history of essential hypertension dyslipidemia and is an active cigarette smoker.  He was seen in the office on 8/26 and reported having chest tightness and used nitro with relief. No radiation to the neck or to the arms. He leads a sedentary lifestyle. Given his reported symptoms a cardiac catheterization was arranged for further evaluation.  Hospital Course     The patient underwent cardiac cath as noted above with dLCx stenosis of 99% with 2 small OM branches, successful PCI/DES x1 to Lcx. Plan for DAPT with ASA/plavix for at least 6 months. Noted to have a borderline lesion in the LAD with DFR of 0.89 to be treated medically at this time. Crestor 20mg  daily was started this admission for risk factor modification. The patient was seen by cardiac rehab while in short stay. There were no observed complications post cath. Radial cath site was re-evaluated prior to discharge and found to be stable without any complications. Instructions/precautions regarding cath site care were given prior to discharge.  was seen by Dr. [name] and determined stable for discharge home. Follow  up with our office has been arranged. Medications are listed below. Pertinent changes include addition of plavix and crestor. Of note, he reported issues with statins in the past but is willing to try Crestor. Consider PCSK9s if unstable to tolerate Crestor.    _____________  Cath/PCI Registry Performance & Quality Measures: Aspirin prescribed? - Yes ADP Receptor Inhibitor (Plavix/Clopidogrel, Brilinta/Ticagrelor or Effient/Prasugrel) prescribed (includes medically managed patients)? - Yes High Intensity Statin (Lipitor 40-80mg  or Crestor 20-40mg ) prescribed? - Yes For EF <40%, was ACEI/ARB prescribed? - Not Applicable (EF >/= 40%) For EF <40%, Aldosterone Antagonist (Spironolactone or Eplerenone) prescribed? - Not Applicable (EF >/= 40%) Cardiac Rehab Phase II ordered (Included Medically managed Patients)? - Yes  _____________   Discharge Vitals Blood pressure (!) 139/96, pulse 75, temperature 98.3 F (36.8 C), temperature source Oral, resp. rate 11, height 5\' 9"  (1.753 m), weight 103.4 kg, SpO2 100 %.  Filed Weights   09/16/19 0934  Weight: 103.4 kg    Last Labs & Radiologic Studies    CBC No results for input(s): WBC, NEUTROABS, HGB, HCT, MCV, PLT in the last 72 hours. Basic Metabolic Panel No results for input(s): NA, K, CL, CO2, GLUCOSE, BUN, CREATININE, CALCIUM, MG, PHOS in the last 72 hours. Liver Function Tests No results for input(s): AST, ALT, ALKPHOS, BILITOT, PROT, ALBUMIN in the last 72 hours. No results for input(s): LIPASE, AMYLASE in the last 72 hours. High Sensitivity Troponin:   No results for input(s): TROPONINIHS in the last 720 hours.  BNP Invalid input(s): POCBNP D-Dimer No results for input(s): DDIMER in the last 72 hours. Hemoglobin A1C No results for input(s): HGBA1C in the last 72 hours. Fasting Lipid Panel No results for input(s): CHOL, HDL, LDLCALC, TRIG, CHOLHDL, LDLDIRECT in the last 72 hours. Thyroid Function Tests No results for input(s): TSH, T4TOTAL, T3FREE, THYROIDAB in the last 72 hours.  Invalid input(s): FREET3 _____________  CARDIAC CATHETERIZATION  Result Date: 09/16/2019  A stent was successfully placed.   Left main is widely patent  The LAD contains a 50% eccentric stenosis.  It  forms a Med[place] 110 bifurcation stenosis with a first diagonal.  DFR on the LAD lesion was 0.89.  Distal circumflex is 99% stenosed followed by 2 tiny obtuse marginal branches.  PCI was performed with some difficulty due to angulation of the circumflex marginal vein greater than 90 degrees.  The stenosis was reduced to 0% with TIMI grade III flow and no evidence of dissection or wire perforation.  Patient was asymptomatic throughout the procedure.  The right coronary is large and free of obstructive disease.  Left ventricle is normal in size and demonstrates normal contractility with EF 60%.  Blood pressure was extremely elevated during the procedure requiring IV nitroglycerin, hydralazine, and sedation to control. RECOMMENDATIONS:  Aspirin and Plavix for 6 months.  The LAD has a borderline stenosis.  The patient needs aggressive blood pressure control.  DFR was borderline at 0.89.  If symptoms persist post circumflex stenting, consider PCI on LAD although it was difficult to recommend this given hemodynamic data.  Risk factor modification.  Rosuvastatin 20 mg/day was started.  Same-day discharge later this evening assuming no complications, chest pain, or other complaints.  Blood pressure must be under control before discharge.    Disposition   Pt is being discharged home today in good condition.  Follow-up Plans & Appointments     Follow-up Information     Revankar, 09/18/2019, MD Follow up on 09/26/2019.   Specialty: Cardiology  Why: at 4:20pm for your follow up appt.  Contact information: [address] [place] [address] [phone]                    Discharge Medications   Allergies as of 09/16/2019       Reactions   Chantix [varenicline Tartrate]    Loss of migraine and vision   Kenalog [triamcinolone Acetonide] Other (See Comments)   Unknown   Lipitor [atorvastatin Calcium]    Memory problem   Oxycodone    Itching   Prednisone    Mental reaction   Valtrex  [valacyclovir Hcl]    Auditory hallucinations/ voices        Medication List     STOP taking these medications    aspirin 325 MG tablet Replaced by: aspirin EC 81 MG tablet   isosorbide mononitrate 60 MG 24 hr tablet Commonly known as: IMDUR   meloxicam 15 MG tablet Commonly known as: MOBIC       TAKE these medications    ALPRAZolam 1 MG tablet Commonly known as: XANAX Take 1 mg by mouth 3 (three) times daily as needed for anxiety.   amLODipine 10 MG tablet Commonly known as: NORVASC Take 10 mg by mouth at bedtime.   aspirin EC 81 MG tablet Take 1 tablet (81 mg total) by mouth daily. Swallow whole. Replaces: aspirin 325 MG tablet   clopidogrel 75 MG tablet Commonly known as: PLAVIX Take 1 tablet (75 mg total) by mouth daily with breakfast. Start taking on: September 17, 2019   furosemide 20 MG tablet Commonly known as: LASIX Take 1 tablet (20 mg total) by mouth every other day.   metoprolol succinate 50 MG 24 hr tablet Commonly known as: TOPROL-XL Take 50 mg by mouth daily. Take with or immediately following a meal.   multivitamin with minerals tablet Take 1 tablet by mouth daily.   nitroGLYCERIN 0.4 MG SL tablet Commonly known as: NITROSTAT Place 1 tablet (0.4 mg total) under the tongue every 5 (five) minutes as needed. What changed: reasons to take this   olmesartan-hydrochlorothiazide 40-25 MG tablet Commonly known as: BENICAR HCT Take 1 tablet by mouth daily.   ondansetron 4 MG disintegrating tablet Commonly known as: Zofran ODT Take 1 tablet (4 mg total) by mouth every 8 (eight) hours as needed for nausea or vomiting.   pantoprazole 40 MG tablet Commonly known as: PROTONIX Take 40 mg by mouth at bedtime.   potassium chloride 10 MEQ tablet Commonly known as: KLOR-CON Take 20 mEq by mouth 2 (two) times daily.   rosuvastatin 20 MG tablet Commonly known as: Crestor Take 1 tablet (20 mg total) by mouth daily.   SUMAtriptan 100 MG  tablet Commonly known as: IMITREX Take 100 mg by mouth every 2 (two) hours as needed for migraine.   VITAMIN B-12 PO Take 1 tablet by mouth daily.   Vitamin E 200 units Tabs Take 200 Units by mouth daily.           Allergies Allergies  Allergen Reactions   Chantix [Varenicline Tartrate]     Loss of migraine and vision   Kenalog [Triamcinolone Acetonide] Other (See Comments)    Unknown   Lipitor [Atorvastatin Calcium]     Memory problem   Oxycodone     Itching   Prednisone     Mental reaction   Valtrex [Valacyclovir Hcl]     Auditory hallucinations/ voices    Outstanding Labs/Studies   FLP/LFTs in 8  weeks if tolerating statin.   Duration of Discharge Encounter   Greater than 30 minutes including physician time.  Signed, Laverda PageLindsay Roberts, NP 09/16/2019, 2:24 PM

## 2019-09-16 NOTE — Progress Notes (Signed)
[id number] Education completed with pt who voiced understanding. Wrote down some of the information at his request so that he would not forget. Discussed importance of plavix with stent. Reviewed NTG use, heart healthy food choices,low sodium, walking for ex, smoking cessation and CRP 2. Pt given smoking cessation handout and encouraged to call [phone]. Will send referral letter to [facility]. Luetta Nutting RN BSN 09/16/2019 2:53 PM

## 2019-09-16 NOTE — CV Procedure (Signed)
FINDINGS:  Left main is widely patent  The LAD contains a 50% eccentric stenosis.  It forms a Med[place] 110 bifurcation stenosis with a first diagonal.  DFR on the LAD lesion was 0.89.  Distal circumflex is 99% stenosed followed by 2 tiny obtuse marginal branches.  PCI was performed with some difficulty due to angulation of the circumflex marginal vein greater than 90 degrees.  The stenosis was reduced to 0% with TIMI grade III flow and no evidence of dissection or wire perforation.  Patient was asymptomatic throughout the procedure.  The right coronary is large and free of obstructive disease.  Left ventricle is normal in size and demonstrates normal contractility with EF 60%.  Blood pressure was extremely elevated during the procedure requiring IV nitroglycerin, hydralazine, and sedation to control.  RECOMMENDATIONS:   Aspirin and Plavix for 6 months.  The LAD has a borderline stenosis.  The patient needs aggressive blood pressure control.  DFR was borderline at 0.89.  If symptoms persist post circumflex stenting, consider PCI on LAD although it was difficult to recommend this given hemodynamic data.  Risk factor modification.  Rosuvastatin 20 mg/day was started.  Same-day discharge later this evening assuming no complications, chest pain, or other complaints.  Blood pressure must be under control before discharge.

## 2019-09-17 ENCOUNTER — Encounter (HOSPITAL_COMMUNITY): Payer: Self-pay | Admitting: Interventional Cardiology

## 2019-09-17 MED FILL — Famotidine in NaCl 0.9% IV Soln 20 MG/50ML: INTRAVENOUS | Qty: 50 | Status: AC

## 2019-09-17 MED FILL — Heparin Sod (Porcine)-NaCl IV Soln 1000 Unit/500ML-0.9%: INTRAVENOUS | Qty: 1000 | Status: AC

## 2019-09-20 ENCOUNTER — Telehealth: Payer: Self-pay | Admitting: Cardiology

## 2019-09-20 NOTE — Telephone Encounter (Signed)
Pt advised to try tylenol and heat alternating with cool pack to see if he gets relief. Pt states that it feels like a pulled muscle. Pt verbalized understanding and had no additional questions.

## 2019-09-20 NOTE — Telephone Encounter (Signed)
Cameron Bryan is calling stating he is having pain right below his rib cage on the left side. He states it feels like he's been punched in the ribs. Cameron Bryan is unable to get comfortable no matter how he sits or lays down. He states it's a level 7 on pain. He had his procedure done Monday 09/16/19 and wants to know if that's normal as well as if theres anything they can do to help him. He has been taking his 81 MG aspirin as advised, but nothing else besides it. Please advise.

## 2019-09-26 ENCOUNTER — Other Ambulatory Visit: Payer: Self-pay

## 2019-09-26 ENCOUNTER — Ambulatory Visit (INDEPENDENT_AMBULATORY_CARE_PROVIDER_SITE_OTHER): Payer: Medicare Other | Admitting: Cardiology

## 2019-09-26 VITALS — BP 178/93 | HR 80 | Ht 69.0 in | Wt 231.6 lb

## 2019-09-26 DIAGNOSIS — F32A Depression, unspecified: Secondary | ICD-10-CM | POA: Insufficient documentation

## 2019-09-26 DIAGNOSIS — I2510 Atherosclerotic heart disease of native coronary artery without angina pectoris: Secondary | ICD-10-CM

## 2019-09-26 DIAGNOSIS — K219 Gastro-esophageal reflux disease without esophagitis: Secondary | ICD-10-CM | POA: Insufficient documentation

## 2019-09-26 DIAGNOSIS — I10 Essential (primary) hypertension: Secondary | ICD-10-CM

## 2019-09-26 DIAGNOSIS — I639 Cerebral infarction, unspecified: Secondary | ICD-10-CM | POA: Insufficient documentation

## 2019-09-26 DIAGNOSIS — F17210 Nicotine dependence, cigarettes, uncomplicated: Secondary | ICD-10-CM

## 2019-09-26 DIAGNOSIS — J449 Chronic obstructive pulmonary disease, unspecified: Secondary | ICD-10-CM | POA: Insufficient documentation

## 2019-09-26 DIAGNOSIS — F23 Brief psychotic disorder: Secondary | ICD-10-CM | POA: Insufficient documentation

## 2019-09-26 DIAGNOSIS — G43009 Migraine without aura, not intractable, without status migrainosus: Secondary | ICD-10-CM | POA: Insufficient documentation

## 2019-09-26 DIAGNOSIS — E782 Mixed hyperlipidemia: Secondary | ICD-10-CM

## 2019-09-26 HISTORY — DX: Atherosclerotic heart disease of native coronary artery without angina pectoris: I25.10

## 2019-09-26 NOTE — Patient Instructions (Signed)
Medication Instructions:  No medication changes. *If you need a refill on your cardiac medications before your next appointment, please call your pharmacy*   Lab Work: None ordered If you have labs (blood work) drawn today and your tests are completely normal, you will receive your results only by: . MyChart Message (if you have MyChart) OR . A paper copy in the mail If you have any lab test that is abnormal or we need to change your treatment, we will call you to review the results.   Testing/Procedures: None ordered   Follow-Up: At [facility], you and your health needs are our priority.  As part of our continuing mission to provide you with exceptional heart care, we have created designated Provider Care Teams.  These Care Teams include your primary Cardiologist (physician) and Advanced Practice Providers (APPs -  Physician Assistants and Nurse Practitioners) who all work together to provide you with the care you need, when you need it.  We recommend signing up for the patient portal called "MyChart".  Sign up information is provided on this After Visit Summary.  MyChart is used to connect with patients for Virtual Visits (Telemedicine).  Patients are able to view lab/test results, encounter notes, upcoming appointments, etc.  Non-urgent messages can be sent to your provider as well.   To learn more about what you can do with MyChart, go to [email or web address].    Your next appointment:   5 month(s)  The format for your next appointment:   In Person  Provider:   Rajan Revankar, MD   Other Instructions NA  

## 2019-09-26 NOTE — Progress Notes (Signed)
Cardiology Office Note:    Date:  09/26/2019   ID:  Cameron Bryan, DOB 10/11/1970, MRN [id number]  PCP:  Marylen Ponto, MD  Cardiologist:  Garwin Brothers, MD   Referring MD: Marylen Ponto, MD    ASSESSMENT:    1. Essential hypertension   2. Mixed dyslipidemia   3. Cigarette smoker   4. Coronary artery disease involving native coronary artery of native heart without angina pectoris    PLAN:    In order of problems listed above:  Coronary artery disease: Coronary angiography report was discussed with the patient at length.  Secondary prevention stressed.  Importance of compliance with diet medication stressed and he vocalized understanding.  Importance of regular exercise stressed and he agrees to walk at least half an hour a day 5 days a week.  We will help him with any paperwork to assist him to get enrolled in the cardiac rehab program at [facility] hospital. Essential hypertension: Blood pressure stable and diet was emphasized Mixed dyslipidemia diabetes mellitus: I discussed my findings with the patient extensively.  He promises to do better with diet and exercise.  Weight reduction was stressed.  Risks of obesity explained and he promises to comply. Patient will be seen in follow-up appointment in 6 months or earlier if the patient has any concerns   Medication Adjustments/Labs and Tests Ordered: Current medicines are reviewed at length with the patient today.  Concerns regarding medicines are outlined above.  No orders of the defined types were placed in this encounter.  No orders of the defined types were placed in this encounter.    No chief complaint on file.    History of Present Illness:    Cameron Bryan is a 49 y.o. male.  Patient was evaluated by me for angina.  He underwent coronary angiography and stenting and the results are mentioned below.  Subsequently standpoint.  He denies any chest pain orthopnea or PND.  He now mentions to me that he can walk much  better without getting out of breath or chest tightness.  He is happy with it.  He is planning to enroll in the cardiac rehab program at [facility].  At the time of my evaluation, the patient is alert awake oriented and in no distress.  Past Medical History:  Diagnosis Date  . Abnormal ECG   . Acute pain of left wrist 04/19/2019  . Angina pectoris (HCC) 08/29/2019  . Bipolar 1 disorder (HCC)   . Blurred vision   . Cephalalgia   . Chest pain 06/05/2014  . Chest tightness 07/23/2018  . Cigarette smoker 07/23/2018  . Common migraine   . Concussion without loss of consciousness 11/24/2015  . COPD (chronic obstructive pulmonary disease) (HCC)   . Depression   . EKG abnormalities 06/05/2014  . Encounter for orthopedic follow-up care 05/17/2019  . Essential hypertension   . Ganglion cyst of volar aspect of left wrist 05/06/2019  . GERD (gastroesophageal reflux disease)   . Headache   . Hypertension   . Mixed dyslipidemia 12/04/2018  . Pedal edema 07/23/2018  . Post concussion syndrome 11/24/2015  . 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HCC)   . Schizoaffective disorder (HCC)   . Stroke (HCC)   . Vision changes 06/05/2014    Past Surgical History:  Procedure Laterality Date  . CORONARY STENT INTERVENTION N/A 09/16/2019   Procedure: CORONARY STENT INTERVENTION;  Surgeon: Lyn Records, MD;  Location: [facility] INVASIVE CV LAB;  Service: Cardiovascular;  Laterality: N/A;  distal cfx   . CYST REMOVAL HAND    . LEFT HEART CATH AND CORONARY ANGIOGRAPHY N/A 09/16/2019   Procedure: LEFT HEART CATH AND CORONARY ANGIOGRAPHY;  Surgeon: Lyn Records, MD;  Location: [facility] INVASIVE CV LAB;  Service: Cardiovascular;  Laterality: N/A;    Current Medications: Current Meds  Medication Sig  . ALPRAZolam (XANAX) 1 MG tablet Take 1 mg by mouth 3 (three) times daily as needed for anxiety.   Marland Kitchen amLODipine (NORVASC) 10 MG tablet Take 10 mg by mouth at bedtime.   Marland Kitchen aspirin EC 81 MG tablet Take 1 tablet (81 mg total) by mouth daily.  Swallow whole.  . clopidogrel (PLAVIX) 75 MG tablet Take 1 tablet (75 mg total) by mouth daily with breakfast.  . Cyanocobalamin (VITAMIN B-12 PO) Take 1 tablet by mouth daily.  . furosemide (LASIX) 20 MG tablet Take 1 tablet (20 mg total) by mouth every other day.  . metoprolol succinate (TOPROL-XL) 50 MG 24 hr tablet Take 50 mg by mouth daily. Take with or immediately following a meal.  . Multiple Vitamins-Minerals (MULTIVITAMIN WITH MINERALS) tablet Take 1 tablet by mouth daily.  . nitroGLYCERIN (NITROSTAT) 0.4 MG SL tablet Place 1 tablet (0.4 mg total) under the tongue every 5 (five) minutes as needed. (Patient taking differently: Place 0.4 mg under the tongue every 5 (five) minutes as needed for chest pain. )  . olmesartan-hydrochlorothiazide (BENICAR HCT) 40-25 MG per tablet Take 1 tablet by mouth daily.  . ondansetron (ZOFRAN ODT) 4 MG disintegrating tablet Take 1 tablet (4 mg total) by mouth every 8 (eight) hours as needed for nausea or vomiting.  . pantoprazole (PROTONIX) 40 MG tablet Take 40 mg by mouth at bedtime.   . potassium chloride (KLOR-CON) 10 MEQ tablet Take 20 mEq by mouth 2 (two) times daily.   . rosuvastatin (CRESTOR) 20 MG tablet Take 1 tablet (20 mg total) by mouth daily.  . SUMAtriptan (IMITREX) 100 MG tablet Take 100 mg by mouth every 2 (two) hours as needed for migraine.   . Vitamin E 200 units TABS Take 200 Units by mouth daily.      Allergies:   Chantix [varenicline tartrate], Kenalog [triamcinolone acetonide], Lipitor [atorvastatin calcium], Oxycodone, Prednisone, and Valtrex [valacyclovir hcl]   Social History   Socioeconomic History  . Marital status: Single    Spouse name: Not on file  . Number of children: Not on file  . Years of education: Not on file  . Highest education level: Not on file  Occupational History  . Not on file  Tobacco Use  . Smoking status: Current Every Day Smoker    Packs/day: 1.00  . Smokeless tobacco: Never Used  Substance and  Sexual Activity  . Alcohol use: No  . Drug use: No  . Sexual activity: Not on file  Other Topics Concern  . Not on file  Social History Narrative  . Not on file   Social Determinants of Health   Financial Resource Strain:   . Difficulty of Paying Living Expenses: Not on file  Food Insecurity:   . Worried About Programme researcher, broadcasting/film/video in the Last Year: Not on file  . Ran Out of Food in the Last Year: Not on file  Transportation Needs:   . Lack of Transportation (Medical): Not on file  . Lack of Transportation (Non-Medical): Not on file  Physical Activity:   . Days of Exercise per Week: Not on file  . Minutes of Exercise per Session:  Not on file  Stress:   . Feeling of Stress : Not on file  Social Connections:   . Frequency of Communication with Friends and Family: Not on file  . Frequency of Social Gatherings with Friends and Family: Not on file  . Attends Religious Services: Not on file  . Active Member of Clubs or Organizations: Not on file  . Attends Banker Meetings: Not on file  . Marital Status: Not on file     Family History: The patient's family history includes Alzheimer's disease in his paternal grandfather.  ROS:   Please see the history of present illness.    All other systems reviewed and are negative.  EKGs/Labs/Other Studies Reviewed:    The following studies were reviewed today: Lyn Records, MD (Primary)    Procedures  CORONARY STENT INTERVENTION  LEFT HEART CATH AND CORONARY ANGIOGRAPHY  Conclusion    A stent was successfully placed.    Left main is widely patent  The LAD contains a 50% eccentric stenosis.  It forms a [place] 110 bifurcation stenosis with a first diagonal.  DFR on the LAD lesion was 0.89.  Distal circumflex is 99% stenosed followed by 2 tiny obtuse marginal branches.  PCI was performed with some difficulty due to angulation of the circumflex marginal vein greater than 90 degrees.  The stenosis was reduced to 0%  with TIMI grade III flow and no evidence of dissection or wire perforation.  Patient was asymptomatic throughout the procedure.  The right coronary is large and free of obstructive disease.  Left ventricle is normal in size and demonstrates normal contractility with EF 60%.  Blood pressure was extremely elevated during the procedure requiring IV nitroglycerin, hydralazine, and sedation to control.  RECOMMENDATIONS:   Aspirin and Plavix for 6 months.  The LAD has a borderline stenosis.  The patient needs aggressive blood pressure control.  DFR was borderline at 0.89.  If symptoms persist post circumflex stenting, consider PCI on LAD although it was difficult to recommend this given hemodynamic data.  Risk factor modification.  Rosuvastatin 20 mg/day was started.  Same-day discharge later this evening assuming no complications, chest pain, or other complaints.  Blood pressure must be under control before discharge.     Recent Labs: 08/29/2019: BUN 13; Creatinine, Ser 0.95; Hemoglobin 16.3; Platelets 262; Potassium 3.8; Sodium 139  Recent Lipid Panel    Component Value Date/Time   CHOL 186 07/18/2018 1456   TRIG 191 (H) 07/18/2018 1456   HDL 40 07/18/2018 1456   CHOLHDL 4.7 07/18/2018 1456   LDLCALC 108 (H) 07/18/2018 1456    Physical Exam:    VS:  BP (!) 178/93   Pulse 80   Ht 5\' 9"  (1.753 m)   Wt 231 lb 9.6 oz (105.1 kg)   SpO2 95%   BMI 34.20 kg/m     Wt Readings from Last 3 Encounters:  09/26/19 231 lb 9.6 oz (105.1 kg)  09/16/19 228 lb (103.4 kg)  08/29/19 224 lb 3.2 oz (101.7 kg)     GEN: Patient is in no acute distress HEENT: Normal NECK: No JVD; No carotid bruits LYMPHATICS: No lymphadenopathy CARDIAC: Hear sounds regular, 2/6 systolic murmur at the apex. RESPIRATORY:  Clear to auscultation without rales, wheezing or rhonchi  ABDOMEN: Soft, non-tender, non-distended MUSCULOSKELETAL:  No edema; No deformity  SKIN: Warm and dry NEUROLOGIC:  Alert and  oriented x 3 PSYCHIATRIC:  Normal affect   Signed, 08/31/19, MD  09/26/2019 4:53  PM    [facility]

## 2019-10-07 DIAGNOSIS — Z0000 Encounter for general adult medical examination without abnormal findings: Secondary | ICD-10-CM | POA: Diagnosis not present

## 2019-10-07 DIAGNOSIS — E785 Hyperlipidemia, unspecified: Secondary | ICD-10-CM | POA: Diagnosis not present

## 2019-10-07 DIAGNOSIS — Z23 Encounter for immunization: Secondary | ICD-10-CM | POA: Diagnosis not present

## 2019-10-21 DIAGNOSIS — J069 Acute upper respiratory infection, unspecified: Secondary | ICD-10-CM | POA: Diagnosis not present

## 2019-10-23 ENCOUNTER — Emergency Department (HOSPITAL_COMMUNITY): Payer: Medicare Other

## 2019-10-23 ENCOUNTER — Emergency Department (HOSPITAL_COMMUNITY)
Admission: EM | Admit: 2019-10-23 | Discharge: 2019-10-23 | Disposition: A | Discharge status: Home / Self Care | Payer: Medicare Other | Attending: Emergency Medicine | Admitting: Emergency Medicine

## 2019-10-23 DIAGNOSIS — J449 Chronic obstructive pulmonary disease, unspecified: Secondary | ICD-10-CM | POA: Diagnosis not present

## 2019-10-23 DIAGNOSIS — K760 Fatty (change of) liver, not elsewhere classified: Secondary | ICD-10-CM | POA: Diagnosis not present

## 2019-10-23 DIAGNOSIS — R1111 Vomiting without nausea: Secondary | ICD-10-CM

## 2019-10-23 DIAGNOSIS — I517 Cardiomegaly: Secondary | ICD-10-CM | POA: Diagnosis not present

## 2019-10-23 DIAGNOSIS — R58 Hemorrhage, not elsewhere classified: Secondary | ICD-10-CM | POA: Diagnosis not present

## 2019-10-23 DIAGNOSIS — K5900 Constipation, unspecified: Secondary | ICD-10-CM | POA: Insufficient documentation

## 2019-10-23 DIAGNOSIS — F17200 Nicotine dependence, unspecified, uncomplicated: Secondary | ICD-10-CM | POA: Insufficient documentation

## 2019-10-23 DIAGNOSIS — I10 Essential (primary) hypertension: Secondary | ICD-10-CM | POA: Diagnosis not present

## 2019-10-23 DIAGNOSIS — R6889 Other general symptoms and signs: Secondary | ICD-10-CM | POA: Diagnosis not present

## 2019-10-23 DIAGNOSIS — Z7982 Long term (current) use of aspirin: Secondary | ICD-10-CM | POA: Diagnosis not present

## 2019-10-23 DIAGNOSIS — K920 Hematemesis: Secondary | ICD-10-CM | POA: Diagnosis not present

## 2019-10-23 DIAGNOSIS — Z955 Presence of coronary angioplasty implant and graft: Secondary | ICD-10-CM | POA: Insufficient documentation

## 2019-10-23 DIAGNOSIS — R112 Nausea with vomiting, unspecified: Secondary | ICD-10-CM | POA: Diagnosis not present

## 2019-10-23 DIAGNOSIS — R079 Chest pain, unspecified: Secondary | ICD-10-CM | POA: Diagnosis not present

## 2019-10-23 DIAGNOSIS — I2510 Atherosclerotic heart disease of native coronary artery without angina pectoris: Secondary | ICD-10-CM | POA: Diagnosis not present

## 2019-10-23 DIAGNOSIS — Z79899 Other long term (current) drug therapy: Secondary | ICD-10-CM | POA: Insufficient documentation

## 2019-10-23 DIAGNOSIS — R1013 Epigastric pain: Secondary | ICD-10-CM | POA: Diagnosis not present

## 2019-10-23 DIAGNOSIS — Z743 Need for continuous supervision: Secondary | ICD-10-CM | POA: Diagnosis not present

## 2019-10-23 LAB — CBC WITH DIFFERENTIAL/PLATELET
Abs Immature Granulocytes: 0.04 10*3/uL (ref 0.00–0.07)
Basophils Absolute: 0 10*3/uL (ref 0.0–0.1)
Basophils Relative: 0 %
Eosinophils Absolute: 0.1 10*3/uL (ref 0.0–0.5)
Eosinophils Relative: 1 %
HCT: 40.2 % (ref 39.0–52.0)
Hemoglobin: 14.4 g/dL (ref 13.0–17.0)
Immature Granulocytes: 1 %
Lymphocytes Relative: 25 %
Lymphs Abs: 2 10*3/uL (ref 0.7–4.0)
MCH: 30.8 pg (ref 26.0–34.0)
MCHC: 35.8 g/dL (ref 30.0–36.0)
MCV: 85.9 fL (ref 80.0–100.0)
Monocytes Absolute: 0.5 10*3/uL (ref 0.1–1.0)
Monocytes Relative: 7 %
Neutro Abs: 5.6 10*3/uL (ref 1.7–7.7)
Neutrophils Relative %: 66 %
Platelets: 263 10*3/uL (ref 150–400)
RBC: 4.68 MIL/uL (ref 4.22–5.81)
RDW: 11.9 % (ref 11.5–15.5)
WBC: 8.3 10*3/uL (ref 4.0–10.5)
nRBC: 0 % (ref 0.0–0.2)

## 2019-10-23 LAB — HEPATIC FUNCTION PANEL
ALT: 69 U/L — ABNORMAL HIGH (ref 0–44)
AST: 63 U/L — ABNORMAL HIGH (ref 15–41)
Albumin: 4.2 g/dL (ref 3.5–5.0)
Alkaline Phosphatase: 86 U/L (ref 38–126)
Bilirubin, Direct: 0.2 mg/dL (ref 0.0–0.2)
Indirect Bilirubin: 0.7 mg/dL (ref 0.3–0.9)
Total Bilirubin: 0.9 mg/dL (ref 0.3–1.2)
Total Protein: 7.4 g/dL (ref 6.5–8.1)

## 2019-10-23 LAB — BASIC METABOLIC PANEL
Anion gap: 15 (ref 5–15)
BUN: 9 mg/dL (ref 6–20)
CO2: 29 mmol/L (ref 22–32)
Calcium: 9.4 mg/dL (ref 8.9–10.3)
Chloride: 88 mmol/L — ABNORMAL LOW (ref 98–111)
Creatinine, Ser: 0.92 mg/dL (ref 0.61–1.24)
GFR, Estimated: >60 mL/min (ref >60)
Glucose, Bld: 92 mg/dL (ref 70–99)
Potassium: 2.8 mmol/L — ABNORMAL LOW (ref 3.5–5.1)
Sodium: 132 mmol/L — ABNORMAL LOW (ref 135–145)

## 2019-10-23 LAB — TROPONIN I (HIGH SENSITIVITY): Troponin I (High Sensitivity): 16 ng/L (ref <18)

## 2019-10-23 LAB — LIPASE, BLOOD: Lipase: 32 U/L (ref 11–51)

## 2019-10-23 MED ORDER — SUCRALFATE 1 G PO TABS: 1.0000 g | ORAL_TABLET | Freq: Three times a day (TID) | ORAL | 0 refills | Status: DC

## 2019-10-23 MED ORDER — ONDANSETRON HCL 4 MG/2ML IJ SOLN
4.0000 mg | Freq: Once | INTRAMUSCULAR | Status: AC
  Administered 2019-10-23: 4 mg via INTRAVENOUS
  Filled 2019-10-23: qty 2

## 2019-10-23 MED ORDER — ALUM & MAG HYDROXIDE-SIMETH 200-200-20 MG/5ML PO SUSP
30.0000 mL | Freq: Once | ORAL | Status: AC
  Administered 2019-10-23: 30 mL via ORAL
  Filled 2019-10-23: qty 30

## 2019-10-23 MED ORDER — IOHEXOL 300 MG/ML  SOLN
100.0000 mL | Freq: Once | INTRAMUSCULAR | Status: AC | PRN
  Administered 2019-10-23: 100 mL via INTRAVENOUS

## 2019-10-23 MED ORDER — SODIUM CHLORIDE 0.9 % IV BOLUS
500.0000 mL | Freq: Once | INTRAVENOUS | Status: AC
  Administered 2019-10-23: 500 mL via INTRAVENOUS

## 2019-10-23 MED ORDER — LIDOCAINE VISCOUS HCL 2 % MT SOLN
15.0000 mL | Freq: Once | OROMUCOSAL | Status: AC
  Administered 2019-10-23: 15 mL via ORAL
  Filled 2019-10-23: qty 15

## 2019-10-23 MED ORDER — POTASSIUM CHLORIDE 10 MEQ/100ML IV SOLN
10.0000 meq | Freq: Once | INTRAVENOUS | Status: AC
  Administered 2019-10-23: 10 meq via INTRAVENOUS
  Filled 2019-10-23: qty 100

## 2019-10-23 MED ORDER — ONDANSETRON HCL 4 MG PO TABS: 4.0000 mg | ORAL_TABLET | Freq: Four times a day (QID) | ORAL | 0 refills | Status: AC

## 2019-10-23 NOTE — ED Triage Notes (Signed)
Pt BIB [facility] from home with c/o vomiting blood x2 episodes , pt denies hx of same, denies abdominal pain, denies diarrhea, endorses constipation.    PMH : HTN , stent placed 1 month ago r/t 98 percent blockage   VS  160/110 94HR  98 RA 16 RR

## 2019-10-23 NOTE — ED Provider Notes (Signed)
[facility]ENC[facility] Provider Note   CSN: [id number] Arrival date & time: 10/23/19  [id number]     History Chief Complaint  Patient presents with  . Vomiting  . Abdominal Pain    Idolina PrimerDavid S Isidro is a 49 y.o. male.  The history is provided by the patient.  Abdominal Pain Pain location:  Epigastric and LUQ Pain quality: aching   Pain severity:  Mild Onset quality:  Gradual Timing:  Constant Progression:  Unchanged Chronicity:  New Context: suspicious food intake   Relieved by:  Nothing Worsened by:  Nothing Associated symptoms: constipation, nausea and vomiting   Associated symptoms: no anorexia, no chest pain, no chills, no cough, no dysuria, no fever, no hematuria, no shortness of breath and no sore throat   Risk factors: has not had multiple surgeries   Risk factors comment:  Cardiac stent placed last week      Past Medical History:  Diagnosis Date  . Abnormal ECG   . Acute pain of left wrist 04/19/2019  . Angina pectoris (HCC) 08/29/2019  . Bipolar 1 disorder (HCC)   . Blurred vision   . Cephalalgia   . Chest pain 06/05/2014  . Chest tightness 07/23/2018  . Cigarette smoker 07/23/2018  . Common migraine   . Concussion without loss of consciousness 11/24/2015  . COPD (chronic obstructive pulmonary disease) (HCC)   . Depression   . EKG abnormalities 06/05/2014  . Encounter for orthopedic follow-up care 05/17/2019  . Essential hypertension   . Ganglion cyst of volar aspect of left wrist 05/06/2019  . GERD (gastroesophageal reflux disease)   . Headache   . Hypertension   . Mixed dyslipidemia 12/04/2018  . Pedal edema 07/23/2018  . Post concussion syndrome 11/24/2015  . 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HCC)   . Schizoaffective disorder (HCC)   . Stroke (HCC)   . Vision changes 06/05/2014    Patient Active Problem List   Diagnosis Date Noted  . Coronary artery disease 09/26/2019  . Common migraine   . COPD (chronic obstructive pulmonary disease) (HCC)     . Depression   . GERD (gastroesophageal reflux disease)   . 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HCC)   . Stroke (HCC)   . Angina pectoris (HCC) 08/29/2019  . Encounter for orthopedic follow-up care 05/17/2019  . Ganglion cyst of volar aspect of left wrist 05/06/2019  . Acute pain of left wrist 04/19/2019  . Mixed dyslipidemia 12/04/2018  . Chest tightness 07/23/2018  . Cigarette smoker 07/23/2018  . Pedal edema 07/23/2018  . Post concussion syndrome 11/24/2015  . Concussion without loss of consciousness 11/24/2015  . Headache 06/05/2014  . Vision changes 06/05/2014  . Chest pain 06/05/2014  . EKG abnormalities 06/05/2014  . Schizoaffective disorder (HCC)   . Hypertension   . Bipolar 1 disorder (HCC)   . Abnormal ECG   . Cephalalgia   . Blurred vision   . Essential hypertension     Past Surgical History:  Procedure Laterality Date  . CORONARY STENT INTERVENTION N/A 09/16/2019   Procedure: CORONARY STENT INTERVENTION;  Surgeon: Lyn RecordsSmith, Henry W, MD;  [facility];  Service: Cardiovascular;  Laterality: N/A;  distal cfx   . CYST REMOVAL HAND    . LEFT HEART CATH AND CORONARY ANGIOGRAPHY N/A 09/16/2019   Procedure: LEFT HEART CATH AND CORONARY ANGIOGRAPHY;  Surgeon: Lyn RecordsSmith, Henry W, MD;  Location: [facility] INVASIVE CV LAB;  Service: Cardiovascular;  Laterality: N/A;       Family History  Problem  Relation Age of Onset  . Alzheimer's disease Paternal Grandfather     Social History   Tobacco Use  . Smoking status: Current Every Day Smoker    Packs/day: 1.00  . Smokeless tobacco: Never Used  Substance Use Topics  . Alcohol use: No  . Drug use: No    Home Medications Prior to Admission medications   Medication Sig Start Date End Date Taking? Authorizing Provider  ALPRAZolam Prudy Feeler) 1 MG tablet Take 1 mg by mouth 3 (three) times daily as needed for anxiety.  10/28/15  Yes [provider]  amLODipine (NORVASC) 10 MG tablet Take 10 mg by mouth at bedtime.    Yes  [provider]  aspirin EC 81 MG tablet Take 1 tablet (81 mg total) by mouth daily. Swallow whole. 09/16/19  Yes Arty Baumgartner, NP  clopidogrel (PLAVIX) 75 MG tablet Take 1 tablet (75 mg total) by mouth daily with breakfast. 09/17/19  Yes Laverda Page B, NP  co-enzyme Q-10 30 MG capsule Take 30 mg by mouth 3 (three) times daily.   Yes [provider]  furosemide (LASIX) 20 MG tablet Take 1 tablet (20 mg total) by mouth every other day. 08/29/19  Yes Revankar, Aundra Dubin, MD  metoprolol succinate (TOPROL-XL) 50 MG 24 hr tablet Take 50 mg by mouth daily. Take with or immediately following a meal.   Yes [provider]  Multiple Vitamins-Minerals (MULTIVITAMIN WITH MINERALS) tablet Take 1 tablet by mouth daily.   Yes [provider]  nitroGLYCERIN (NITROSTAT) 0.4 MG SL tablet Place 1 tablet (0.4 mg total) under the tongue every 5 (five) minutes as needed. Patient taking differently: Place 0.4 mg under the tongue every 5 (five) minutes as needed for chest pain.  08/29/19 11/27/19 Yes Revankar, Aundra Dubin, MD  olmesartan-hydrochlorothiazide (BENICAR HCT) 40-25 MG per tablet Take 1 tablet by mouth daily.   Yes [provider]  ondansetron (ZOFRAN ODT) 4 MG disintegrating tablet Take 1 tablet (4 mg total) by mouth every 8 (eight) hours as needed for nausea or vomiting. 11/18/15  Yes Francoise Ceo, DO  pantoprazole (PROTONIX) 40 MG tablet Take 40 mg by mouth at bedtime.  04/08/18  Yes [provider]  potassium chloride (KLOR-CON) 10 MEQ tablet Take 40 mEq by mouth 2 (two) times daily.  10/31/18  Yes [provider]  rosuvastatin (CRESTOR) 20 MG tablet Take 1 tablet (20 mg total) by mouth daily. Patient taking differently: Take 5 mg by mouth daily.  09/16/19 09/15/20 Yes Lyn Records, MD  SUMAtriptan (IMITREX) 100 MG tablet Take 100 mg by mouth every 2 (two) hours as needed for migraine.  08/29/18  Yes [provider]  Vitamin E 200 units  TABS Take 200 Units by mouth daily.    Yes [provider]  ondansetron (ZOFRAN) 4 MG tablet Take 1 tablet (4 mg total) by mouth every 6 (six) hours for 15 doses. 10/23/19 10/27/19  Kimberle Stanfill, DO  sucralfate (CARAFATE) 1 g tablet Take 1 tablet (1 g total) by mouth 4 (four) times daily -  with meals and at bedtime for 14 days. 10/23/19 11/06/19  Virgina Norfolk, DO    Allergies    Chantix [varenicline tartrate], Kenalog [triamcinolone acetonide], Lipitor [atorvastatin calcium], Oxycodone, Prednisone, and Valtrex [valacyclovir hcl]  Review of Systems   Review of Systems  Constitutional: Negative for chills and fever.  HENT: Negative for ear pain and sore throat.   Eyes: Negative for pain and visual disturbance.  Respiratory: Negative for cough and shortness of breath.   Cardiovascular: Negative for chest pain and palpitations.  Gastrointestinal: Positive for abdominal pain, constipation, nausea and vomiting. Negative for anorexia.  Genitourinary: Negative for dysuria and hematuria.  Musculoskeletal: Negative for arthralgias and back pain.  Skin: Negative for color change and rash.  Neurological: Negative for seizures and syncope.  All other systems reviewed and are negative.   Physical Exam Updated Vital Signs BP (!) 142/82   Pulse 76   Temp 98.1 F (36.7 C) (Oral)   Resp 18   Ht 5\' 9"  (1.753 m)   Wt 103.4 kg   SpO2 99%   BMI 33.67 kg/m   Physical Exam Vitals and nursing note reviewed.  Constitutional:      General: He is not in acute distress.    Appearance: He is well-developed. He is not ill-appearing.  HENT:     Head: Normocephalic and atraumatic.     Mouth/Throat:     Mouth: Mucous membranes are moist.  Eyes:     Extraocular Movements: Extraocular movements intact.     Conjunctiva/sclera: Conjunctivae normal.  Cardiovascular:     Rate and Rhythm: Normal rate and regular rhythm.     Heart sounds: Normal heart sounds. No murmur heard.   Pulmonary:      Effort: Pulmonary effort is normal. No respiratory distress.     Breath sounds: Normal breath sounds.  Abdominal:     Palpations: Abdomen is soft.     Tenderness: There is abdominal tenderness in the epigastric area and left upper quadrant. There is no right CVA tenderness, left CVA tenderness, guarding or rebound. Negative signs include Murphy's sign and Rovsing's sign.  Musculoskeletal:     Cervical back: Neck supple.  Skin:    General: Skin is warm and dry.     Capillary Refill: Capillary refill takes less than 2 seconds.  Neurological:     General: No focal deficit present.     Mental Status: He is alert.  Psychiatric:        Mood and Affect: Mood normal.     ED Results / Procedures / Treatments   Labs (all labs ordered are listed, but only abnormal results are displayed) Labs Reviewed  BASIC METABOLIC PANEL - Abnormal; Notable for the following components:      Result Value   Sodium 132 (*)    Potassium 2.8 (*)    Chloride 88 (*)    All other components within normal limits  HEPATIC FUNCTION PANEL - Abnormal; Notable for the following components:   AST 63 (*)    ALT 69 (*)    All other components within normal limits  CBC WITH DIFFERENTIAL/PLATELET  LIPASE, BLOOD  URINALYSIS, ROUTINE W REFLEX MICROSCOPIC  TROPONIN I (HIGH SENSITIVITY)    EKG EKG Interpretation  Date/Time:  Wednesday October 23 2019 07:00:07 EDT Ventricular Rate:  76 PR Interval:    QRS Duration: 103 QT Interval:  405 QTC Calculation: 456 R Axis:   13 Text Interpretation: Sinus rhythm Confirmed by 01-02-1986 [phone]) on 10/23/2019 7:07:05 AM   Radiology CT ABDOMEN PELVIS W CONTRAST  Result Date: 10/23/2019 CLINICAL DATA:  Hematemesis, constipation EXAM: CT ABDOMEN AND PELVIS WITH CONTRAST TECHNIQUE: Multidetector CT imaging of the abdomen and pelvis was performed using the standard protocol following bolus administration of intravenous contrast. CONTRAST:  10/25/2019 OMNIPAQUE IOHEXOL 300  MG/ML  SOLN COMPARISON:  None. FINDINGS: Lower chest: The visualized lung bases are clear bilaterally. The visualized heart and  pericardium are unremarkable. Hepatobiliary: Mild to moderate hepatic steatosis. Gallbladder unremarkable. No intra or extrahepatic biliary ductal dilation. Pancreas: Unremarkable Spleen: Unremarkable Adrenals/Urinary Tract: Adrenal glands are unremarkable. Kidneys are normal, without renal calculi, focal lesion, or hydronephrosis. Bladder is unremarkable. Stomach/Bowel: The stomach and small bowel are unremarkable. Mild pancolonic diverticulosis. The large bowel is otherwise unremarkable. Relatively little stool noted. Appendix normal. No free intraperitoneal gas or fluid. Vascular/Lymphatic: Minimal aortoiliac atherosclerotic calcification. No aortic aneurysm. Retroaortic left renal vein noted. No pathologic adenopathy. Reproductive: Prostate is unremarkable. Other: Probable cord lipoma within the left inguinal canal. Tiny fat containing umbilical hernia. Subcutaneous 18 mm soft tissue nodule immediately subjacent to the dermis anterosuperior to the pubic symphysis is nonspecific but may represent a cutaneous lesion such as an epidermoid cyst. Musculoskeletal: No lytic or blastic bone lesions are seen. No acute bone abnormality. IMPRESSION: No radiographic explanation for the patient's reported hematemesis. No acute intra-abdominal abnormality identified. Incidental findings as noted above. Aortic Atherosclerosis (ICD10-I70.0). Electronically Signed   By: Helyn Numbers MD   On: 10/23/2019 09:34   DG Chest Portable 1 View  Result Date: 10/23/2019 CLINICAL DATA:  Chest pain. EXAM: PORTABLE CHEST 1 VIEW COMPARISON:  01/13/2018.  09/16/2015.  06/05/2014. FINDINGS: Mediastinum hilar structures normal. Lungs are clear. No pleural effusion or pneumothorax. Borderline cardiomegaly. No pulmonary venous congestion. No acute bony abnormality. IMPRESSION: 1. Borderline cardiomegaly. No  pulmonary venous congestion. 2. No acute pulmonary disease. Electronically Signed   By: Maisie Fus  Register   On: 10/23/2019 07:28    Procedures Procedures (including critical care time)  Medications Ordered in ED Medications  alum & mag hydroxide-simeth (MAALOX/MYLANTA) 200-200-20 MG/5ML suspension 30 mL (has no administration in time range)    And  lidocaine (XYLOCAINE) 2 % viscous mouth solution 15 mL (has no administration in time range)  ondansetron (ZOFRAN) injection 4 mg (4 mg Intravenous Given 10/23/19 0847)  sodium chloride 0.9 % bolus 500 mL (500 mLs Intravenous New Bag/Given 10/23/19 0849)  potassium chloride 10 mEq in 100 mL IVPB (10 mEq Intravenous New Bag/Given 10/23/19 0849)  iohexol (OMNIPAQUE) 300 MG/ML solution 100 mL (100 mLs Intravenous Contrast Given 10/23/19 2707)    ED Course  I have reviewed the triage vital signs and the nursing notes.  Pertinent labs & imaging results that were available during my care of the patient were reviewed by me and considered in my medical decision making (see chart for details).    MDM Rules/Calculators/A&P                          DEMETRUIS DEPAUL is a 49 year old male with history of high cholesterol, hypertension, bipolar disorder who presents to the ED with abdominal pain.  Unremarkable vitals.  Several episodes of emesis overnight.  Suspect possibly suspicious food intake.  Has not had any diarrhea but has actually had some constipation.  Discomfort in the epigastric and left upper quadrant part of his abdomen on exam.  No obvious peritonitis.  No black stools or bloody stools.  Nonbloody emesis.  Has had a recent cardiac stent placed denies chest pain, shortness of breath.  Suspect possibly foodborne illness versus gastritis versus pancreatitis.  Less likely hepatobiliary process such as cholecystitis.  Less likely cardiac process.  EKG shows sinus rhythm.  No ischemic changes.  Given that he is having some left-sided abdominal pain in the  upper region will check troponin to further evaluate for cardiac process.  Will get a CT  scan abdomen pelvis to further evaluate as well.  Lab work shows no significant anemia or leukocytosis.  Potassium is 2.8 and repleted.  Suspect this is from episodes of vomiting.  No current abdominal pain, nausea, vomiting.  CT scan of the abdomen pelvis is overall unremarkable.  Suspect likely some gastritis.  No no cirrhotic changes seen to the liver on CT scan.  No concern for varices.  No free air in the abdomen.  Overall, patient likely with foodborne illness versus gastritis.  We will add Carafate to his Protonix.  Given return precautions and discharged from ED in good condition.  This chart was dictated using voice recognition software.  Despite best efforts to proofread,  errors can occur which can change the documentation meaning.    Final Clinical Impression(s) / ED Diagnoses Final diagnoses:  Epigastric pain  Vomiting without nausea, intractability of vomiting not specified, unspecified vomiting type    Rx / DC Orders ED Discharge Orders         Ordered    ondansetron (ZOFRAN) 4 MG tablet  Every 6 hours        10/23/19 0948    sucralfate (CARAFATE) 1 g tablet  3 times daily with meals & bedtime        10/23/19 0948           Virgina Norfolk, DO 10/23/19 [phone]

## 2019-11-01 DIAGNOSIS — R509 Fever, unspecified: Secondary | ICD-10-CM | POA: Diagnosis not present

## 2019-11-23 DIAGNOSIS — I10 Essential (primary) hypertension: Secondary | ICD-10-CM | POA: Diagnosis not present

## 2019-11-23 DIAGNOSIS — Z20822 Contact with and (suspected) exposure to covid-19: Secondary | ICD-10-CM | POA: Diagnosis not present

## 2019-11-23 DIAGNOSIS — R509 Fever, unspecified: Secondary | ICD-10-CM | POA: Diagnosis not present

## 2019-11-30 DIAGNOSIS — Z20822 Contact with and (suspected) exposure to covid-19: Secondary | ICD-10-CM | POA: Diagnosis not present

## 2019-11-30 DIAGNOSIS — J22 Unspecified acute lower respiratory infection: Secondary | ICD-10-CM | POA: Diagnosis not present

## 2019-12-02 DIAGNOSIS — R059 Cough, unspecified: Secondary | ICD-10-CM | POA: Diagnosis not present

## 2019-12-09 DIAGNOSIS — R509 Fever, unspecified: Secondary | ICD-10-CM | POA: Diagnosis not present

## 2020-02-24 ENCOUNTER — Other Ambulatory Visit: Payer: Self-pay

## 2020-02-25 ENCOUNTER — Ambulatory Visit: Payer: Medicare Other | Admitting: Cardiology

## 2020-03-21 DIAGNOSIS — M25571 Pain in right ankle and joints of right foot: Secondary | ICD-10-CM | POA: Diagnosis not present

## 2020-03-21 DIAGNOSIS — S99911A Unspecified injury of right ankle, initial encounter: Secondary | ICD-10-CM | POA: Diagnosis not present

## 2020-03-30 DIAGNOSIS — M79604 Pain in right leg: Secondary | ICD-10-CM | POA: Diagnosis not present

## 2020-04-03 DIAGNOSIS — I639 Cerebral infarction, unspecified: Secondary | ICD-10-CM | POA: Insufficient documentation

## 2020-04-06 ENCOUNTER — Ambulatory Visit (INDEPENDENT_AMBULATORY_CARE_PROVIDER_SITE_OTHER): Payer: Medicare Other | Admitting: Cardiology

## 2020-04-06 ENCOUNTER — Encounter: Payer: Self-pay | Admitting: Cardiology

## 2020-04-06 ENCOUNTER — Other Ambulatory Visit: Payer: Self-pay

## 2020-04-06 VITALS — BP 158/90 | HR 84 | Ht 69.0 in | Wt 238.0 lb

## 2020-04-06 DIAGNOSIS — F17210 Nicotine dependence, cigarettes, uncomplicated: Secondary | ICD-10-CM | POA: Diagnosis not present

## 2020-04-06 DIAGNOSIS — I2510 Atherosclerotic heart disease of native coronary artery without angina pectoris: Secondary | ICD-10-CM | POA: Diagnosis not present

## 2020-04-06 DIAGNOSIS — E785 Hyperlipidemia, unspecified: Secondary | ICD-10-CM | POA: Diagnosis not present

## 2020-04-06 DIAGNOSIS — I10 Essential (primary) hypertension: Secondary | ICD-10-CM

## 2020-04-06 DIAGNOSIS — Z79899 Other long term (current) drug therapy: Secondary | ICD-10-CM | POA: Diagnosis not present

## 2020-04-06 DIAGNOSIS — E782 Mixed hyperlipidemia: Secondary | ICD-10-CM

## 2020-04-06 NOTE — Progress Notes (Signed)
Cardiology Office Note:    Date:  04/06/2020   ID:  Cameron Bryan, DOB 06/02/1970, MRN [id number]  PCP:  Marylen Ponto, MD  Cardiologist:  Garwin Brothers, MD   Referring MD: Marylen Ponto, MD    ASSESSMENT:    1. Coronary artery disease involving native coronary artery of native heart without angina pectoris   2. Essential hypertension   3. Mixed dyslipidemia   4. Cigarette smoker    PLAN:    In order of problems listed above:  1. Coronary artery disease: Secondary prevention stressed with the patient.  Importance of compliance with diet medication stressed and he vocalized understanding.  His blood pressure stable.  He was advised to walk at least half an hour a day 5 days a week and he vocalized understanding. 2. Essential hypertension: Stable blood pressure.  And his blood pressure is elevated today because he came here in a rash and he also mentions to me that he has orthopedic issues and lower extremity and its bothering him with the pain.  He is seeing orthopedic doctor for this.  I told him to take care of his pain and that will help his blood pressure.  Or her blood pressure at home otherwise is fine according to the history he provides me. 3. Mixed dyslipidemia: Diet was emphasized.  Weight reduction stressed lifestyle modification urged.  He has had complete blood work with his primary care doctor this morning and he will send me a copy of it when he gets the reports. 4. Cigarette smoker: I spent 5 minutes with the patient discussing solely about smoking. Smoking cessation was counseled. I suggested to the patient also different medications and pharmacological interventions. Patient is keen to try stopping on its own at this time. He will get back to me if he needs any further assistance in this matter. 5. Patient will be seen in follow-up appointment in 6 months or earlier if the patient has any concerns    Medication Adjustments/Labs and Tests Ordered: Current medicines  are reviewed at length with the patient today.  Concerns regarding medicines are outlined above.  No orders of the defined types were placed in this encounter.  No orders of the defined types were placed in this encounter.    No chief complaint on file.    History of Present Illness:    Cameron Bryan is a 50 y.o. male.  Patient has past medical history of coronary artery disease post stenting essential hypertension dyslipidemia cigarette smoking.  He denies any problems at this time.  He tells me that his blood pressure elevated because of his pain in his foot.  He is seeing orthopedic doctor for this.  He has an appointment coming up.  No chest pain orthopnea or PND.  At the time of my evaluation, the patient is alert awake oriented and in no distress.  Past Medical History:  Diagnosis Date  . Abnormal ECG   . Acute pain of left wrist 04/19/2019  . Angina pectoris (HCC) 08/29/2019  . Bipolar 1 disorder (HCC)   . Blurred vision   . Cephalalgia   . Chest pain 06/05/2014  . Chest tightness 07/23/2018  . Cigarette smoker 07/23/2018  . Common migraine   . Concussion without loss of consciousness 11/24/2015  . COPD (chronic obstructive pulmonary disease) (HCC)   . Coronary artery disease 09/26/2019  . Depression   . EKG abnormalities 06/05/2014  . Encounter for orthopedic follow-up care 05/17/2019  .  Essential hypertension   . Ganglion cyst of volar aspect of left wrist 05/06/2019  . GERD (gastroesophageal reflux disease)   . Headache   . Hypertension   . Mixed dyslipidemia 12/04/2018  . Pedal edema 07/23/2018  . Post concussion syndrome 11/24/2015  . 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HCC)   . Schizoaffective disorder (HCC)   . Stroke (HCC)   . Vision changes 06/05/2014    Past Surgical History:  Procedure Laterality Date  . CORONARY STENT INTERVENTION N/A 09/16/2019   Procedure: CORONARY STENT INTERVENTION;  Surgeon: Lyn Records, MD;  Location: [facility] INVASIVE CV LAB;  Service: Cardiovascular;   Laterality: N/A;  distal cfx   . CYST REMOVAL HAND    . LEFT HEART CATH AND CORONARY ANGIOGRAPHY N/A 09/16/2019   Procedure: LEFT HEART CATH AND CORONARY ANGIOGRAPHY;  Surgeon: Lyn Records, MD;  Location: [facility] INVASIVE CV LAB;  Service: Cardiovascular;  Laterality: N/A;    Current Medications: Current Meds  Medication Sig  . ALPRAZolam (XANAX) 1 MG tablet Take 1 mg by mouth 3 (three) times daily as needed for anxiety.   Marland Kitchen amLODipine (NORVASC) 10 MG tablet Take 10 mg by mouth at bedtime.   Marland Kitchen aspirin EC 81 MG tablet Take 1 tablet (81 mg total) by mouth daily. Swallow whole.  . clopidogrel (PLAVIX) 75 MG tablet Take 1 tablet (75 mg total) by mouth daily with breakfast.  . co-enzyme Q-10 30 MG capsule Take 30 mg by mouth 3 (three) times daily.  . divalproex (DEPAKOTE) 250 MG DR tablet Take 250 mg by mouth 2 (two) times daily.  . furosemide (LASIX) 20 MG tablet Take 20 mg by mouth 2 (two) times daily.  . meloxicam (MOBIC) 15 MG tablet Take 15 mg by mouth daily.  . metoprolol succinate (TOPROL-XL) 50 MG 24 hr tablet Take 50 mg by mouth daily. Take with or immediately following a meal.  . Multiple Vitamins-Minerals (MULTIVITAMIN WITH MINERALS) tablet Take 1 tablet by mouth daily.  . nitroGLYCERIN (NITROSTAT) 0.4 MG SL tablet Place 0.4 mg under the tongue as needed for chest pain. Every 5 minutes  . olmesartan-hydrochlorothiazide (BENICAR HCT) 40-25 MG per tablet Take 1 tablet by mouth daily.  . ondansetron (ZOFRAN ODT) 4 MG disintegrating tablet Take 1 tablet (4 mg total) by mouth every 8 (eight) hours as needed for nausea or vomiting.  . ondansetron (ZOFRAN) 4 MG tablet Take 4 mg by mouth every 8 (eight) hours as needed for nausea/vomiting.  . ondansetron (ZOFRAN) 8 MG tablet Take 8 mg by mouth 3 (three) times daily.  . pantoprazole (PROTONIX) 40 MG tablet Take 40 mg by mouth at bedtime.   . potassium chloride (KLOR-CON) 10 MEQ tablet Take 40 mEq by mouth 2 (two) times daily.   . potassium  chloride (KLOR-CON) 10 MEQ tablet Take 10 mEq by mouth in the morning, at noon, in the evening, and at bedtime.  . rosuvastatin (CRESTOR) 20 MG tablet Take 5 mg by mouth 3 (three) times a week.  . SUMAtriptan (IMITREX) 100 MG tablet Take 100 mg by mouth every 2 (two) hours as needed for migraine.   . vitamin E 45 MG (100 UNITS) capsule Take 100 Units by mouth daily.     Allergies:   Atorvastatin, Chantix [varenicline tartrate], Oxycodone, Prednisone, Triamcinolone acetonide, and Valtrex [valacyclovir hcl]   Social History   Socioeconomic History  . Marital status: Single    Spouse name: Not on file  . Number of children: Not on file  . Years  of education: Not on file  . Highest education level: Not on file  Occupational History  . Not on file  Tobacco Use  . Smoking status: Current Every Day Smoker    Packs/day: 1.00  . Smokeless tobacco: Never Used  Substance and Sexual Activity  . Alcohol use: No  . Drug use: No  . Sexual activity: Not on file  Other Topics Concern  . Not on file  Social History Narrative  . Not on file   Social Determinants of Health   Financial Resource Strain: Not on file  Food Insecurity: Not on file  Transportation Needs: Not on file  Physical Activity: Not on file  Stress: Not on file  Social Connections: Not on file     Family History: The patient's family history includes Alzheimer's disease in his paternal grandfather.  ROS:   Please see the history of present illness.    All other systems reviewed and are negative.  EKGs/Labs/Other Studies Reviewed:    The following studies were reviewed today: I discussed my findings with the patient at length including coronary angiography report   Recent Labs: 10/23/2019: ALT 69; BUN 9; Creatinine, Ser 0.92; Hemoglobin 14.4; Platelets 263; Potassium 2.8; Sodium 132  Recent Lipid Panel    Component Value Date/Time   CHOL 186 07/18/2018 1456   TRIG 191 (H) 07/18/2018 1456   HDL 40 07/18/2018  1456   CHOLHDL 4.7 07/18/2018 1456   LDLCALC 108 (H) 07/18/2018 1456    Physical Exam:    VS:  BP (!) 158/90   Pulse 84   Ht 5\' 9"  (1.753 m)   Wt 238 lb (108 kg)   SpO2 97%   BMI 35.15 kg/m     Wt Readings from Last 3 Encounters:  04/06/20 238 lb (108 kg)  10/23/19 228 lb (103.4 kg)  09/26/19 231 lb 9.6 oz (105.1 kg)     GEN: Patient is in no acute distress HEENT: Normal NECK: No JVD; No carotid bruits LYMPHATICS: No lymphadenopathy CARDIAC: Hear sounds regular, 2/6 systolic murmur at the apex. RESPIRATORY:  Clear to auscultation without rales, wheezing or rhonchi  ABDOMEN: Soft, non-tender, non-distended MUSCULOSKELETAL:  No edema; No deformity  SKIN: Warm and dry NEUROLOGIC:  Alert and oriented x 3 PSYCHIATRIC:  Normal affect   Signed, 09/28/19, MD  04/06/2020 3:20 PM    [facility]

## 2020-04-06 NOTE — Patient Instructions (Signed)

## 2020-04-13 DIAGNOSIS — S86319A Strain of muscle(s) and tendon(s) of peroneal muscle group at lower leg level, unspecified leg, initial encounter: Secondary | ICD-10-CM | POA: Insufficient documentation

## 2020-04-13 DIAGNOSIS — S86311A Strain of muscle(s) and tendon(s) of peroneal muscle group at lower leg level, right leg, initial encounter: Secondary | ICD-10-CM | POA: Diagnosis not present

## 2020-04-13 HISTORY — DX: Strain of muscle(s) and tendon(s) of peroneal muscle group at lower leg level, unspecified leg, initial encounter: S86.319A

## 2020-04-15 ENCOUNTER — Telehealth: Payer: Self-pay

## 2020-04-15 DIAGNOSIS — E782 Mixed hyperlipidemia: Secondary | ICD-10-CM

## 2020-04-15 MED ORDER — EZETIMIBE 10 MG PO TABS: 10.0000 mg | ORAL_TABLET | Freq: Every day | ORAL | 3 refills | Status: DC

## 2020-04-15 NOTE — Telephone Encounter (Signed)
Results reviewed with pt as per Dr. Kem Parkinson note on PCP labs. We will add zetia.  Pt verbalized understanding and had no additional questions. Routed to PCP.

## 2020-04-24 DIAGNOSIS — M25571 Pain in right ankle and joints of right foot: Secondary | ICD-10-CM | POA: Diagnosis not present

## 2020-04-24 DIAGNOSIS — S86311D Strain of muscle(s) and tendon(s) of peroneal muscle group at lower leg level, right leg, subsequent encounter: Secondary | ICD-10-CM | POA: Diagnosis not present

## 2020-05-23 DIAGNOSIS — E876 Hypokalemia: Secondary | ICD-10-CM | POA: Diagnosis not present

## 2020-05-23 DIAGNOSIS — I6389 Other cerebral infarction: Secondary | ICD-10-CM | POA: Diagnosis not present

## 2020-05-23 DIAGNOSIS — Z7982 Long term (current) use of aspirin: Secondary | ICD-10-CM | POA: Diagnosis not present

## 2020-05-23 DIAGNOSIS — E871 Hypo-osmolality and hyponatremia: Secondary | ICD-10-CM | POA: Diagnosis not present

## 2020-05-23 DIAGNOSIS — R531 Weakness: Secondary | ICD-10-CM | POA: Diagnosis not present

## 2020-05-23 DIAGNOSIS — I161 Hypertensive emergency: Secondary | ICD-10-CM | POA: Diagnosis not present

## 2020-05-23 DIAGNOSIS — R29707 NIHSS score 7: Secondary | ICD-10-CM | POA: Diagnosis not present

## 2020-05-23 DIAGNOSIS — E860 Dehydration: Secondary | ICD-10-CM | POA: Diagnosis not present

## 2020-05-23 DIAGNOSIS — J449 Chronic obstructive pulmonary disease, unspecified: Secondary | ICD-10-CM | POA: Diagnosis not present

## 2020-05-23 DIAGNOSIS — Z8673 Personal history of transient ischemic attack (TIA), and cerebral infarction without residual deficits: Secondary | ICD-10-CM | POA: Diagnosis not present

## 2020-05-23 DIAGNOSIS — F17210 Nicotine dependence, cigarettes, uncomplicated: Secondary | ICD-10-CM | POA: Diagnosis not present

## 2020-05-23 DIAGNOSIS — I6300 Cerebral infarction due to thrombosis of unspecified precerebral artery: Secondary | ICD-10-CM | POA: Diagnosis not present

## 2020-05-23 DIAGNOSIS — Z7902 Long term (current) use of antithrombotics/antiplatelets: Secondary | ICD-10-CM | POA: Diagnosis not present

## 2020-05-23 DIAGNOSIS — I10 Essential (primary) hypertension: Secondary | ICD-10-CM | POA: Diagnosis not present

## 2020-05-23 DIAGNOSIS — R7401 Elevation of levels of liver transaminase levels: Secondary | ICD-10-CM | POA: Diagnosis not present

## 2020-05-23 DIAGNOSIS — R4781 Slurred speech: Secondary | ICD-10-CM | POA: Diagnosis not present

## 2020-05-23 DIAGNOSIS — R29818 Other symptoms and signs involving the nervous system: Secondary | ICD-10-CM | POA: Diagnosis not present

## 2020-05-23 DIAGNOSIS — Z79899 Other long term (current) drug therapy: Secondary | ICD-10-CM | POA: Diagnosis not present

## 2020-05-23 DIAGNOSIS — Z23 Encounter for immunization: Secondary | ICD-10-CM | POA: Diagnosis not present

## 2020-05-23 DIAGNOSIS — E878 Other disorders of electrolyte and fluid balance, not elsewhere classified: Secondary | ICD-10-CM | POA: Diagnosis not present

## 2020-05-23 DIAGNOSIS — Z955 Presence of coronary angioplasty implant and graft: Secondary | ICD-10-CM | POA: Diagnosis not present

## 2020-05-23 DIAGNOSIS — G4733 Obstructive sleep apnea (adult) (pediatric): Secondary | ICD-10-CM | POA: Diagnosis not present

## 2020-05-24 DIAGNOSIS — I161 Hypertensive emergency: Secondary | ICD-10-CM | POA: Diagnosis not present

## 2020-05-24 DIAGNOSIS — Z8673 Personal history of transient ischemic attack (TIA), and cerebral infarction without residual deficits: Secondary | ICD-10-CM | POA: Diagnosis not present

## 2020-05-24 DIAGNOSIS — I6300 Cerebral infarction due to thrombosis of unspecified precerebral artery: Secondary | ICD-10-CM | POA: Diagnosis not present

## 2020-05-24 DIAGNOSIS — E871 Hypo-osmolality and hyponatremia: Secondary | ICD-10-CM | POA: Diagnosis not present

## 2020-06-02 DIAGNOSIS — I10 Essential (primary) hypertension: Secondary | ICD-10-CM | POA: Diagnosis not present

## 2020-06-02 DIAGNOSIS — Z09 Encounter for follow-up examination after completed treatment for conditions other than malignant neoplasm: Secondary | ICD-10-CM | POA: Diagnosis not present

## 2020-06-07 DIAGNOSIS — I672 Cerebral atherosclerosis: Secondary | ICD-10-CM | POA: Diagnosis not present

## 2020-06-07 DIAGNOSIS — I160 Hypertensive urgency: Secondary | ICD-10-CM | POA: Diagnosis not present

## 2020-06-07 DIAGNOSIS — E876 Hypokalemia: Secondary | ICD-10-CM | POA: Diagnosis not present

## 2020-06-07 DIAGNOSIS — I700 Atherosclerosis of aorta: Secondary | ICD-10-CM | POA: Diagnosis not present

## 2020-06-07 DIAGNOSIS — E785 Hyperlipidemia, unspecified: Secondary | ICD-10-CM | POA: Diagnosis not present

## 2020-06-07 DIAGNOSIS — Z955 Presence of coronary angioplasty implant and graft: Secondary | ICD-10-CM | POA: Diagnosis not present

## 2020-06-07 DIAGNOSIS — Z885 Allergy status to narcotic agent status: Secondary | ICD-10-CM | POA: Diagnosis not present

## 2020-06-07 DIAGNOSIS — I10 Essential (primary) hypertension: Secondary | ICD-10-CM | POA: Diagnosis not present

## 2020-06-07 DIAGNOSIS — E7800 Pure hypercholesterolemia, unspecified: Secondary | ICD-10-CM | POA: Diagnosis not present

## 2020-06-07 DIAGNOSIS — M1990 Unspecified osteoarthritis, unspecified site: Secondary | ICD-10-CM | POA: Diagnosis not present

## 2020-06-07 DIAGNOSIS — F32A Depression, unspecified: Secondary | ICD-10-CM | POA: Diagnosis not present

## 2020-06-07 DIAGNOSIS — K219 Gastro-esophageal reflux disease without esophagitis: Secondary | ICD-10-CM | POA: Diagnosis not present

## 2020-06-07 DIAGNOSIS — Z79899 Other long term (current) drug therapy: Secondary | ICD-10-CM | POA: Diagnosis not present

## 2020-06-07 DIAGNOSIS — Z8673 Personal history of transient ischemic attack (TIA), and cerebral infarction without residual deficits: Secondary | ICD-10-CM | POA: Diagnosis not present

## 2020-06-07 DIAGNOSIS — R519 Headache, unspecified: Secondary | ICD-10-CM | POA: Diagnosis not present

## 2020-06-07 DIAGNOSIS — F17210 Nicotine dependence, cigarettes, uncomplicated: Secondary | ICD-10-CM | POA: Diagnosis not present

## 2020-06-07 DIAGNOSIS — Z7982 Long term (current) use of aspirin: Secondary | ICD-10-CM | POA: Diagnosis not present

## 2020-06-07 DIAGNOSIS — R531 Weakness: Secondary | ICD-10-CM | POA: Diagnosis not present

## 2020-06-07 DIAGNOSIS — H538 Other visual disturbances: Secondary | ICD-10-CM | POA: Diagnosis not present

## 2020-06-07 DIAGNOSIS — J449 Chronic obstructive pulmonary disease, unspecified: Secondary | ICD-10-CM | POA: Diagnosis not present

## 2020-06-07 DIAGNOSIS — I2510 Atherosclerotic heart disease of native coronary artery without angina pectoris: Secondary | ICD-10-CM | POA: Diagnosis not present

## 2020-06-07 DIAGNOSIS — G43909 Migraine, unspecified, not intractable, without status migrainosus: Secondary | ICD-10-CM | POA: Diagnosis not present

## 2020-06-07 DIAGNOSIS — Z7901 Long term (current) use of anticoagulants: Secondary | ICD-10-CM | POA: Diagnosis not present

## 2020-06-07 DIAGNOSIS — R410 Disorientation, unspecified: Secondary | ICD-10-CM | POA: Diagnosis not present

## 2020-06-07 DIAGNOSIS — I639 Cerebral infarction, unspecified: Secondary | ICD-10-CM | POA: Diagnosis not present

## 2020-06-07 DIAGNOSIS — I6529 Occlusion and stenosis of unspecified carotid artery: Secondary | ICD-10-CM | POA: Diagnosis not present

## 2020-06-08 DIAGNOSIS — R200 Anesthesia of skin: Secondary | ICD-10-CM | POA: Diagnosis not present

## 2020-06-08 DIAGNOSIS — R531 Weakness: Secondary | ICD-10-CM | POA: Diagnosis not present

## 2020-06-15 DIAGNOSIS — I10 Essential (primary) hypertension: Secondary | ICD-10-CM | POA: Diagnosis not present

## 2020-06-15 DIAGNOSIS — Z0000 Encounter for general adult medical examination without abnormal findings: Secondary | ICD-10-CM | POA: Diagnosis not present

## 2020-06-23 ENCOUNTER — Other Ambulatory Visit: Payer: Self-pay | Admitting: *Deleted

## 2020-06-23 MED ORDER — CLOPIDOGREL BISULFATE 75 MG PO TABS: 75.0000 mg | ORAL_TABLET | Freq: Every day | ORAL | 2 refills | Status: DC

## 2020-07-07 DIAGNOSIS — W57XXXA Bitten or stung by nonvenomous insect and other nonvenomous arthropods, initial encounter: Secondary | ICD-10-CM | POA: Diagnosis not present

## 2020-07-07 DIAGNOSIS — S80862A Insect bite (nonvenomous), left lower leg, initial encounter: Secondary | ICD-10-CM | POA: Diagnosis not present

## 2020-07-07 DIAGNOSIS — L03116 Cellulitis of left lower limb: Secondary | ICD-10-CM | POA: Diagnosis not present

## 2020-08-06 DIAGNOSIS — R42 Dizziness and giddiness: Secondary | ICD-10-CM | POA: Diagnosis not present

## 2020-08-06 DIAGNOSIS — H60502 Unspecified acute noninfective otitis externa, left ear: Secondary | ICD-10-CM | POA: Diagnosis not present

## 2020-08-06 DIAGNOSIS — I10 Essential (primary) hypertension: Secondary | ICD-10-CM | POA: Diagnosis not present

## 2020-08-07 DIAGNOSIS — R0602 Shortness of breath: Secondary | ICD-10-CM | POA: Diagnosis not present

## 2020-08-07 DIAGNOSIS — T63441A Toxic effect of venom of bees, accidental (unintentional), initial encounter: Secondary | ICD-10-CM | POA: Diagnosis not present

## 2020-08-07 DIAGNOSIS — T782XXA Anaphylactic shock, unspecified, initial encounter: Secondary | ICD-10-CM | POA: Diagnosis not present

## 2020-10-08 DIAGNOSIS — K219 Gastro-esophageal reflux disease without esophagitis: Secondary | ICD-10-CM | POA: Diagnosis not present

## 2020-10-08 DIAGNOSIS — Z79899 Other long term (current) drug therapy: Secondary | ICD-10-CM | POA: Diagnosis not present

## 2020-10-08 DIAGNOSIS — Z23 Encounter for immunization: Secondary | ICD-10-CM | POA: Diagnosis not present

## 2020-10-08 DIAGNOSIS — E785 Hyperlipidemia, unspecified: Secondary | ICD-10-CM | POA: Diagnosis not present

## 2020-10-08 DIAGNOSIS — I10 Essential (primary) hypertension: Secondary | ICD-10-CM | POA: Diagnosis not present

## 2020-10-10 DIAGNOSIS — E876 Hypokalemia: Secondary | ICD-10-CM | POA: Diagnosis not present

## 2020-10-10 DIAGNOSIS — R0602 Shortness of breath: Secondary | ICD-10-CM | POA: Diagnosis not present

## 2020-10-10 DIAGNOSIS — Z955 Presence of coronary angioplasty implant and graft: Secondary | ICD-10-CM | POA: Diagnosis not present

## 2020-10-10 DIAGNOSIS — R0789 Other chest pain: Secondary | ICD-10-CM | POA: Diagnosis not present

## 2020-10-10 DIAGNOSIS — R079 Chest pain, unspecified: Secondary | ICD-10-CM | POA: Diagnosis not present

## 2020-10-10 DIAGNOSIS — E079 Disorder of thyroid, unspecified: Secondary | ICD-10-CM | POA: Diagnosis not present

## 2020-10-11 DIAGNOSIS — I2510 Atherosclerotic heart disease of native coronary artery without angina pectoris: Secondary | ICD-10-CM | POA: Diagnosis not present

## 2020-10-11 DIAGNOSIS — E876 Hypokalemia: Secondary | ICD-10-CM | POA: Diagnosis not present

## 2020-10-11 DIAGNOSIS — Z7982 Long term (current) use of aspirin: Secondary | ICD-10-CM | POA: Diagnosis not present

## 2020-10-11 DIAGNOSIS — Z79899 Other long term (current) drug therapy: Secondary | ICD-10-CM | POA: Diagnosis not present

## 2020-10-11 DIAGNOSIS — R110 Nausea: Secondary | ICD-10-CM | POA: Diagnosis not present

## 2020-10-11 DIAGNOSIS — I252 Old myocardial infarction: Secondary | ICD-10-CM | POA: Diagnosis not present

## 2020-10-11 DIAGNOSIS — R079 Chest pain, unspecified: Secondary | ICD-10-CM | POA: Diagnosis not present

## 2020-10-11 DIAGNOSIS — Z789 Other specified health status: Secondary | ICD-10-CM

## 2020-10-11 DIAGNOSIS — Z955 Presence of coronary angioplasty implant and graft: Secondary | ICD-10-CM | POA: Diagnosis not present

## 2020-10-11 DIAGNOSIS — E785 Hyperlipidemia, unspecified: Secondary | ICD-10-CM | POA: Diagnosis not present

## 2020-10-11 DIAGNOSIS — R0602 Shortness of breath: Secondary | ICD-10-CM | POA: Diagnosis not present

## 2020-10-11 DIAGNOSIS — I10 Essential (primary) hypertension: Secondary | ICD-10-CM | POA: Diagnosis not present

## 2020-10-11 DIAGNOSIS — Z8673 Personal history of transient ischemic attack (TIA), and cerebral infarction without residual deficits: Secondary | ICD-10-CM | POA: Diagnosis not present

## 2020-10-11 DIAGNOSIS — Z7902 Long term (current) use of antithrombotics/antiplatelets: Secondary | ICD-10-CM | POA: Diagnosis not present

## 2020-10-11 DIAGNOSIS — E782 Mixed hyperlipidemia: Secondary | ICD-10-CM | POA: Diagnosis not present

## 2020-10-11 DIAGNOSIS — E669 Obesity, unspecified: Secondary | ICD-10-CM

## 2020-10-11 DIAGNOSIS — R0789 Other chest pain: Secondary | ICD-10-CM | POA: Diagnosis not present

## 2020-10-11 DIAGNOSIS — E871 Hypo-osmolality and hyponatremia: Secondary | ICD-10-CM | POA: Diagnosis not present

## 2020-10-11 DIAGNOSIS — F17210 Nicotine dependence, cigarettes, uncomplicated: Secondary | ICD-10-CM | POA: Diagnosis not present

## 2020-10-11 HISTORY — DX: Obesity, unspecified: E66.9

## 2020-10-11 HISTORY — DX: Hypokalemia: E87.6

## 2020-10-11 HISTORY — DX: Other specified health status: Z78.9

## 2020-10-12 DIAGNOSIS — R0789 Other chest pain: Secondary | ICD-10-CM | POA: Diagnosis not present

## 2020-10-12 DIAGNOSIS — I10 Essential (primary) hypertension: Secondary | ICD-10-CM | POA: Diagnosis not present

## 2020-10-12 DIAGNOSIS — I2510 Atherosclerotic heart disease of native coronary artery without angina pectoris: Secondary | ICD-10-CM | POA: Diagnosis not present

## 2020-10-12 DIAGNOSIS — F17210 Nicotine dependence, cigarettes, uncomplicated: Secondary | ICD-10-CM | POA: Diagnosis not present

## 2020-10-12 DIAGNOSIS — R079 Chest pain, unspecified: Secondary | ICD-10-CM | POA: Diagnosis not present

## 2020-10-12 DIAGNOSIS — E782 Mixed hyperlipidemia: Secondary | ICD-10-CM | POA: Diagnosis not present

## 2020-10-12 DIAGNOSIS — Z955 Presence of coronary angioplasty implant and graft: Secondary | ICD-10-CM | POA: Diagnosis not present

## 2020-10-12 DIAGNOSIS — Z8673 Personal history of transient ischemic attack (TIA), and cerebral infarction without residual deficits: Secondary | ICD-10-CM | POA: Diagnosis not present

## 2020-10-12 DIAGNOSIS — R9431 Abnormal electrocardiogram [ECG] [EKG]: Secondary | ICD-10-CM | POA: Diagnosis not present

## 2020-10-12 DIAGNOSIS — E876 Hypokalemia: Secondary | ICD-10-CM | POA: Diagnosis not present

## 2020-10-12 DIAGNOSIS — E785 Hyperlipidemia, unspecified: Secondary | ICD-10-CM | POA: Diagnosis not present

## 2020-10-14 ENCOUNTER — Ambulatory Visit: Payer: Medicare Other | Admitting: Cardiology

## 2020-10-19 ENCOUNTER — Other Ambulatory Visit: Payer: Self-pay

## 2020-10-27 DIAGNOSIS — R059 Cough, unspecified: Secondary | ICD-10-CM | POA: Diagnosis not present

## 2020-10-30 ENCOUNTER — Ambulatory Visit: Payer: Medicare Other | Admitting: Cardiology

## 2020-11-02 DIAGNOSIS — A938 Other specified arthropod-borne viral fevers: Secondary | ICD-10-CM | POA: Diagnosis not present

## 2020-11-02 DIAGNOSIS — U099 Post covid-19 condition, unspecified: Secondary | ICD-10-CM | POA: Diagnosis not present

## 2021-01-24 ENCOUNTER — Other Ambulatory Visit: Payer: Self-pay | Admitting: Cardiology

## 2021-05-03 ENCOUNTER — Encounter: Payer: Self-pay | Admitting: Cardiology

## 2021-05-03 ENCOUNTER — Ambulatory Visit (INDEPENDENT_AMBULATORY_CARE_PROVIDER_SITE_OTHER): Payer: 59 | Admitting: Cardiology

## 2021-05-03 VITALS — BP 168/108 | HR 79 | Ht 69.0 in | Wt 250.4 lb

## 2021-05-03 DIAGNOSIS — F17210 Nicotine dependence, cigarettes, uncomplicated: Secondary | ICD-10-CM

## 2021-05-03 DIAGNOSIS — I2510 Atherosclerotic heart disease of native coronary artery without angina pectoris: Secondary | ICD-10-CM

## 2021-05-03 DIAGNOSIS — I10 Essential (primary) hypertension: Secondary | ICD-10-CM | POA: Diagnosis not present

## 2021-05-03 DIAGNOSIS — E669 Obesity, unspecified: Secondary | ICD-10-CM | POA: Diagnosis not present

## 2021-05-03 DIAGNOSIS — E782 Mixed hyperlipidemia: Secondary | ICD-10-CM | POA: Diagnosis not present

## 2021-05-03 DIAGNOSIS — Z789 Other specified health status: Secondary | ICD-10-CM

## 2021-05-03 MED ORDER — NEXLIZET 180-10 MG PO TABS
1.0000 | ORAL_TABLET | Freq: Every day | ORAL | 1 refills | Status: DC
Start: 2021-05-03 — End: 2021-10-21

## 2021-05-03 NOTE — Addendum Note (Signed)
Addended by: Roosvelt Harps R on: 05/03/2021 09:29 AM ? ? Modules accepted: Orders ? ?

## 2021-05-03 NOTE — Patient Instructions (Addendum)
Medication Instructions:  ?Your physician has recommended you make the following change in your medication:  ?Discontinue Zetia ?Start Nexlizet once daily ? ?*If you need a refill on your cardiac medications before your next appointment, please call your pharmacy* ? ? ?Lab Work: ?Your physician recommends that you return for lab work in: Today for Bmet ?Come back in 6 weeks for a Liver Panel and a fasting Lipid Profile ? ?If you have labs (blood work) drawn today and your tests are completely normal, you will receive your results only by: ?MyChart Message (if you have MyChart) OR ?A paper copy in the mail ?If you have any lab test that is abnormal or we need to change your treatment, we will call you to review the results. ? ? ?Testing/Procedures: ?NONE ? ? ?Follow-Up: ?At [facility], you and your health needs are our priority.  As part of our continuing mission to provide you with exceptional heart care, we have created designated Provider Care Teams.  These Care Teams include your primary Cardiologist (physician) and Advanced Practice Providers (APPs -  Physician Assistants and Nurse Practitioners) who all work together to provide you with the care you need, when you need it. ? ?We recommend signing up for the patient portal called "MyChart".  Sign up information is provided on this After Visit Summary.  MyChart is used to connect with patients for Virtual Visits (Telemedicine).  Patients are able to view lab/test results, encounter notes, upcoming appointments, etc.  Non-urgent messages can be sent to your provider as well.   ?To learn more about what you can do with MyChart, go to [email or web address].   ? ?Your next appointment:   ?6 month(s) ? ?The format for your next appointment:   ?In Person ? ?Provider:   ?Belva Crome, MD  ? ? ?Other Instructions ?Check BP and Pulse for 2 weeks and sent to Dr. Tomie China ? ?Important Information About Sugar ? ? ? ? ?  ?

## 2021-05-03 NOTE — Progress Notes (Signed)
?Cardiology Office Note:   ? ?Date:  05/03/2021  ? ?ID:  Cameron Bryan, DOB 02/04/1970, MRN [id number] ? ?PCP:  Marylen Ponto, MD  ?Cardiologist:  Garwin Brothers, MD  ? ?Referring MD: Marylen Ponto, MD  ? ? ?ASSESSMENT:   ? ?1. Essential hypertension   ?2. Coronary artery disease involving native coronary artery of native heart without angina pectoris   ?3. Mixed dyslipidemia   ?4. Obesity (BMI 30-39.9)   ?5. Statin intolerance   ? ?PLAN:   ? ?In order of problems listed above: ? ?Coronary artery disease: Secondary prevention stressed with the patient.  Importance of compliance with diet medication stressed any vocalized understanding.  He was advised to walk at least half an hour a day 5 days a week and he promises to do so. ?Essential hypertension: Blood pressure stable and diet was emphasized.  Lifestyle modification urged.  He will keep a track of his blood pressures and send me a copy. ?Hyponatremia and hypokalemia: We will have a Chem-7 today. ?Mixed dyslipidemia: Diet was emphasized.  Lifestyle modification urged.  He is statin intolerant.  I will stop Zetia and start next visit.  He will be back in 6 weeks for liver lipid check. ?Cigarette smoker: I spent 5 minutes with the patient discussing solely about smoking. Smoking cessation was counseled. I suggested to the patient also different medications and pharmacological interventions. Patient is keen to try stopping on its own at this time. He will get back to me if he needs any further assistance in this matter. ?Weight reduction stressed centers of obesity explained and he promises to comply. ?Patient will be seen in follow-up appointment in 6 months or earlier if the patient has any concerns ? ? ? ?Medication Adjustments/Labs and Tests Ordered: ?Current medicines are reviewed at length with the patient today.  Concerns regarding medicines are outlined above.  ?Orders Placed This Encounter  ?Procedures  ? Basic metabolic panel  ? EKG 12-Lead  ? ?No orders  of the defined types were placed in this encounter. ? ? ? ?No chief complaint on file. ?  ? ?History of Present Illness:   ? ?Cameron Bryan is a 51 y.o. male.  Patient has past medical history of coronary artery disease post stenting, essential hypertension, mixed dyslipidemia and obesity.  He denies any problems at this time and takes care of activities of daily.  No chest pain orthopnea or PND.  At the time of my evaluation, the patient is alert awake oriented and in no distress. ? ?Past Medical History:  ?Diagnosis Date  ? Abnormal ECG   ? Acute pain of left wrist 04/19/2019  ? Angina pectoris (HCC) 08/29/2019  ? Bipolar 1 disorder (HCC)   ? Blurred vision   ? Cephalalgia   ? Chest pain 06/05/2014  ? Chest tightness 07/23/2018  ? Chronic coronary artery disease 09/26/2019  ? Cigarette smoker 07/23/2018  ? Common migraine   ? Concussion without loss of consciousness 11/24/2015  ? COPD (chronic obstructive pulmonary disease) (HCC)   ? Depression   ? EKG abnormalities 06/05/2014  ? Encounter for orthopedic follow-up care 05/17/2019  ? Essential hypertension   ? Ganglion cyst of volar aspect of left wrist 05/06/2019  ? GERD (gastroesophageal reflux disease)   ? Headache   ? Hypertension   ? Hypokalemia 10/11/2020  ? Mixed dyslipidemia 12/04/2018  ? Obesity (BMI 30-39.9) 10/11/2020  ? Pedal edema 07/23/2018  ? Post concussion syndrome 11/24/2015  ? 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  HCC)   ? Schizoaffective disorder (HCC)   ? Statin intolerance 10/11/2020  ? Strain of peroneal tendon 04/13/2020  ? Stroke [facility](HCC)   ? Vision changes 06/05/2014  ? ? ?Past Surgical History:  ?Procedure Laterality Date  ? CORONARY STENT INTERVENTION N/A 09/16/2019  ? Procedure: CORONARY STENT INTERVENTION;  Surgeon: Lyn RecordsSmith, Henry W, MD;  [facility];  Service: Cardiovascular;  Laterality: N/A;  distal cfx ?  ? CYST REMOVAL HAND    ? LEFT HEART CATH AND CORONARY ANGIOGRAPHY N/A 09/16/2019  ? Procedure: LEFT HEART CATH AND CORONARY ANGIOGRAPHY;   Surgeon: Lyn RecordsSmith, Henry W, MD;  [facility];  Service: Cardiovascular;  Laterality: N/A;  ? ? ?Current Medications: ?Current Meds  ?Medication Sig  ? ALPRAZolam (XANAX) 1 MG tablet Take 1 mg by mouth 3 (three) times daily as needed for anxiety.   ? amLODipine (NORVASC) 10 MG tablet Take 10 mg by mouth at bedtime.   ? aspirin EC 81 MG tablet Take 1 tablet (81 mg total) by mouth daily. Swallow whole.  ? clopidogrel (PLAVIX) 75 MG tablet TAKE 1 TABLET BY MOUTH  DAILY WITH BREAKFAST  ? co-enzyme Q-10 30 MG capsule Take 30 mg by mouth 3 (three) times daily.  ? divalproex (DEPAKOTE) 250 MG DR tablet Take 250 mg by mouth 2 (two) times daily.  ? EDARBI 40 MG TABS Take 40 mg by mouth daily.  ? EPINEPHrine 0.3 mg/0.3 mL IJ SOAJ injection Inject 0.3 mg into the muscle as needed for anaphylaxis.  ? ezetimibe (ZETIA) 10 MG tablet Take 10 mg by mouth daily.  ? furosemide (LASIX) 20 MG tablet Take 20 mg by mouth 2 (two) times daily.  ? hydrALAZINE (APRESOLINE) 25 MG tablet Take 25 mg by mouth 3 (three) times daily.  ? meloxicam (MOBIC) 15 MG tablet Take 15 mg by mouth as needed for pain.  ? metoprolol succinate (TOPROL-XL) 100 MG 24 hr tablet Take 100 mg by mouth daily.  ? Multiple Vitamins-Minerals (MULTIVITAMIN WITH MINERALS) tablet Take 1 tablet by mouth daily.  ? nitroGLYCERIN (NITROSTAT) 0.4 MG SL tablet Place 0.4 mg under the tongue as needed for chest pain. Every 5 minutes  ? ondansetron (ZOFRAN) 8 MG tablet Take 8 mg by mouth 3 (three) times daily.  ? pantoprazole (PROTONIX) 40 MG tablet Take 40 mg by mouth at bedtime.   ? potassium chloride (KLOR-CON) 10 MEQ tablet Take 40 mEq by mouth 2 (two) times daily.   ? sucralfate (CARAFATE) 1 g tablet Take 1 g by mouth 4 (four) times daily.  ? SUMAtriptan (IMITREX) 100 MG tablet Take 100 mg by mouth every 2 (two) hours as needed for migraine.   ? vitamin E 45 MG (100 UNITS) capsule Take 100 Units by mouth daily.  ?  ? ?Allergies:   Prednisone, Atorvastatin, Chantix  [varenicline tartrate], Oxycodone, Triamcinolone acetonide, and Valtrex [valacyclovir hcl]  ? ?Social History  ? ?Socioeconomic History  ? Marital status: Single  ?  Spouse name: Not on file  ? Number of children: Not on file  ? Years of education: Not on file  ? Highest education level: Not on file  ?Occupational History  ? Not on file  ?Tobacco Use  ? Smoking status: Every Day  ?  Packs/day: 1.00  ?  Types: Cigarettes  ? Smokeless tobacco: Never  ?Substance and Sexual Activity  ? Alcohol use: No  ? Drug use: No  ? Sexual activity: Not on file  ?Other Topics  Concern  ? Not on file  ?Social History Narrative  ? Not on file  ? ?Social Determinants of Health  ? ?Financial Resource Strain: Not on file  ?Food Insecurity: Not on file  ?Transportation Needs: Not on file  ?Physical Activity: Not on file  ?Stress: Not on file  ?Social Connections: Not on file  ?  ? ?Family History: ?The patient's family history includes Alzheimer's disease in his paternal grandfather. ? ?ROS:   ?Please see the history of present illness.    ?All other systems reviewed and are negative. ? ?EKGs/Labs/Other Studies Reviewed:   ? ?The following studies were reviewed today: ?I discussed my findings with the patient at length.  EKG reveals sinus rhythm and nonspecific ST-T changes ? ? ?Recent Labs: ?No results found for requested labs within last 8760 hours.  ?Recent Lipid Panel ?   ?Component Value Date/Time  ? CHOL 186 07/18/2018 1456  ? TRIG 191 (H) 07/18/2018 1456  ? HDL 40 07/18/2018 1456  ? CHOLHDL 4.7 07/18/2018 1456  ? LDLCALC 108 (H) 07/18/2018 1456  ? ? ?Physical Exam:   ? ?VS:  BP (!) 168/108   Pulse 79   Ht 5\' 9"  (1.753 m)   Wt 250 lb 6.4 oz (113.6 kg)   SpO2 98%   BMI 36.98 kg/m?    ? ?Wt Readings from Last 3 Encounters:  ?05/03/21 250 lb 6.4 oz (113.6 kg)  ?04/06/20 238 lb (108 kg)  ?10/23/19 228 lb (103.4 kg)  ?  ? ?GEN: Patient is in no acute distress ?HEENT: Normal ?NECK: No JVD; No carotid bruits ?LYMPHATICS: No  lymphadenopathy ?CARDIAC: Hear sounds regular, 2/6 systolic murmur at the apex. ?RESPIRATORY:  Clear to auscultation without rales, wheezing or rhonchi  ?ABDOMEN: Soft, non-tender, non-distended ?MUSCULOSKELETAL:  N

## 2021-05-04 LAB — BASIC METABOLIC PANEL
BUN/Creatinine Ratio: 12 (ref 9–20)
BUN: 11 mg/dL (ref 6–24)
CO2: 27 mmol/L (ref 20–29)
Calcium: 9.4 mg/dL (ref 8.7–10.2)
Chloride: 102 mmol/L (ref 96–106)
Creatinine, Ser: 0.93 mg/dL (ref 0.76–1.27)
Glucose: 104 mg/dL — ABNORMAL HIGH (ref 70–99)
Potassium: 4.3 mmol/L (ref 3.5–5.2)
Sodium: 140 mmol/L (ref 134–144)
eGFR: 101 mL/min/{1.73_m2} (ref >59)

## 2021-05-05 ENCOUNTER — Telehealth: Payer: Self-pay

## 2021-05-05 NOTE — Telephone Encounter (Signed)
PA started on CMM for Nexletol 180-10 mg. Key [id number] ?

## 2021-05-28 ENCOUNTER — Other Ambulatory Visit: Payer: Self-pay

## 2021-05-28 ENCOUNTER — Encounter (HOSPITAL_COMMUNITY): Payer: Self-pay | Admitting: *Deleted

## 2021-05-28 ENCOUNTER — Observation Stay (HOSPITAL_COMMUNITY)
Admission: EM | Admit: 2021-05-28 | Discharge: 2021-05-30 | Disposition: A | Discharge status: Home / Self Care | Payer: 59 | Attending: Internal Medicine | Admitting: Internal Medicine

## 2021-05-28 DIAGNOSIS — I10 Essential (primary) hypertension: Secondary | ICD-10-CM | POA: Diagnosis present

## 2021-05-28 DIAGNOSIS — Z7982 Long term (current) use of aspirin: Secondary | ICD-10-CM | POA: Insufficient documentation

## 2021-05-28 DIAGNOSIS — J449 Chronic obstructive pulmonary disease, unspecified: Secondary | ICD-10-CM | POA: Diagnosis present

## 2021-05-28 DIAGNOSIS — Z955 Presence of coronary angioplasty implant and graft: Secondary | ICD-10-CM | POA: Insufficient documentation

## 2021-05-28 DIAGNOSIS — E782 Mixed hyperlipidemia: Secondary | ICD-10-CM | POA: Diagnosis present

## 2021-05-28 DIAGNOSIS — Z7902 Long term (current) use of antithrombotics/antiplatelets: Secondary | ICD-10-CM | POA: Insufficient documentation

## 2021-05-28 DIAGNOSIS — Z79899 Other long term (current) drug therapy: Secondary | ICD-10-CM | POA: Diagnosis not present

## 2021-05-28 DIAGNOSIS — W57XXXA Bitten or stung by nonvenomous insect and other nonvenomous arthropods, initial encounter: Secondary | ICD-10-CM

## 2021-05-28 DIAGNOSIS — R7989 Other specified abnormal findings of blood chemistry: Secondary | ICD-10-CM

## 2021-05-28 DIAGNOSIS — Z20822 Contact with and (suspected) exposure to covid-19: Secondary | ICD-10-CM | POA: Insufficient documentation

## 2021-05-28 DIAGNOSIS — Z8673 Personal history of transient ischemic attack (TIA), and cerebral infarction without residual deficits: Secondary | ICD-10-CM | POA: Insufficient documentation

## 2021-05-28 DIAGNOSIS — M25562 Pain in left knee: Secondary | ICD-10-CM

## 2021-05-28 DIAGNOSIS — R509 Fever, unspecified: Secondary | ICD-10-CM

## 2021-05-28 DIAGNOSIS — K219 Gastro-esophageal reflux disease without esophagitis: Secondary | ICD-10-CM | POA: Diagnosis present

## 2021-05-28 DIAGNOSIS — K0889 Other specified disorders of teeth and supporting structures: Secondary | ICD-10-CM | POA: Diagnosis not present

## 2021-05-28 DIAGNOSIS — F17210 Nicotine dependence, cigarettes, uncomplicated: Secondary | ICD-10-CM | POA: Diagnosis present

## 2021-05-28 DIAGNOSIS — G8929 Other chronic pain: Secondary | ICD-10-CM

## 2021-05-28 DIAGNOSIS — I2510 Atherosclerotic heart disease of native coronary artery without angina pectoris: Secondary | ICD-10-CM | POA: Diagnosis not present

## 2021-05-28 DIAGNOSIS — R590 Localized enlarged lymph nodes: Secondary | ICD-10-CM

## 2021-05-28 DIAGNOSIS — R7401 Elevation of levels of liver transaminase levels: Secondary | ICD-10-CM | POA: Insufficient documentation

## 2021-05-28 DIAGNOSIS — E876 Hypokalemia: Secondary | ICD-10-CM | POA: Insufficient documentation

## 2021-05-28 DIAGNOSIS — A689 Relapsing fever, unspecified: Secondary | ICD-10-CM

## 2021-05-28 DIAGNOSIS — E669 Obesity, unspecified: Secondary | ICD-10-CM | POA: Diagnosis present

## 2021-05-28 DIAGNOSIS — F259 Schizoaffective disorder, unspecified: Secondary | ICD-10-CM | POA: Diagnosis present

## 2021-05-28 HISTORY — DX: Other specified abnormal findings of blood chemistry: R79.89

## 2021-05-28 HISTORY — DX: Fever, unspecified: R50.9

## 2021-05-28 HISTORY — DX: Localized enlarged lymph nodes: R59.0

## 2021-05-28 LAB — CBC
HCT: 44.9 % (ref 39.0–52.0)
Hemoglobin: 15.8 g/dL (ref 13.0–17.0)
MCH: 32 pg (ref 26.0–34.0)
MCHC: 35.2 g/dL (ref 30.0–36.0)
MCV: 90.9 fL (ref 80.0–100.0)
Platelets: 225 10*3/uL (ref 150–400)
RBC: 4.94 MIL/uL (ref 4.22–5.81)
RDW: 12.6 % (ref 11.5–15.5)
WBC: 9.7 10*3/uL (ref 4.0–10.5)
nRBC: 0 % (ref 0.0–0.2)

## 2021-05-28 LAB — URINALYSIS, ROUTINE W REFLEX MICROSCOPIC
Bilirubin Urine: NEGATIVE
Glucose, UA: NEGATIVE mg/dL
Hgb urine dipstick: NEGATIVE
Ketones, ur: NEGATIVE mg/dL
Leukocytes,Ua: NEGATIVE
Nitrite: NEGATIVE
Protein, ur: 100 mg/dL — AB
Specific Gravity, Urine: 1.018 (ref 1.005–1.030)
pH: 5 (ref 5.0–8.0)

## 2021-05-28 LAB — COMPREHENSIVE METABOLIC PANEL
ALT: 90 U/L — ABNORMAL HIGH (ref 0–44)
AST: 70 U/L — ABNORMAL HIGH (ref 15–41)
Albumin: 3.6 g/dL (ref 3.5–5.0)
Alkaline Phosphatase: 79 U/L (ref 38–126)
Anion gap: 9 (ref 5–15)
BUN: 12 mg/dL (ref 6–20)
CO2: 27 mmol/L (ref 22–32)
Calcium: 9 mg/dL (ref 8.9–10.3)
Chloride: 103 mmol/L (ref 98–111)
Creatinine, Ser: 1.02 mg/dL (ref 0.61–1.24)
GFR, Estimated: >60 mL/min (ref >60)
Glucose, Bld: 81 mg/dL (ref 70–99)
Potassium: 3.8 mmol/L (ref 3.5–5.1)
Sodium: 139 mmol/L (ref 135–145)
Total Bilirubin: 0.1 mg/dL — ABNORMAL LOW (ref 0.3–1.2)
Total Protein: 6.8 g/dL (ref 6.5–8.1)

## 2021-05-28 LAB — C-REACTIVE PROTEIN: CRP: 1.6 mg/dL — ABNORMAL HIGH (ref <1.0)

## 2021-05-28 LAB — LACTIC ACID, PLASMA
Lactic Acid, Venous: 2.2 mmol/L (ref 0.5–1.9)
Lactic Acid, Venous: 1.1 mmol/L (ref 0.5–1.9)

## 2021-05-28 LAB — URIC ACID: Uric Acid, Serum: 5 mg/dL (ref 3.7–8.6)

## 2021-05-28 LAB — HIV ANTIBODY (ROUTINE TESTING W REFLEX): HIV Screen 4th Generation wRfx: NONREACTIVE

## 2021-05-28 LAB — LACTATE DEHYDROGENASE: LDH: 203 U/L — ABNORMAL HIGH (ref 98–192)

## 2021-05-28 LAB — SEDIMENTATION RATE: Sed Rate: 20 mm/hr — ABNORMAL HIGH (ref 0–16)

## 2021-05-28 LAB — CK: Total CK: 95 U/L (ref 49–397)

## 2021-05-28 MED ORDER — METOPROLOL SUCCINATE ER 100 MG PO TB24
100.0000 mg | ORAL_TABLET | Freq: Every day | ORAL | Status: DC
  Administered 2021-05-29 – 2021-05-30 (×2): 100 mg via ORAL
  Filled 2021-05-28 (×2): qty 1

## 2021-05-28 MED ORDER — PANTOPRAZOLE SODIUM 40 MG PO TBEC
40.0000 mg | DELAYED_RELEASE_TABLET | Freq: Every day | ORAL | Status: DC
  Administered 2021-05-28 – 2021-05-29 (×2): 40 mg via ORAL
  Filled 2021-05-28 (×2): qty 1

## 2021-05-28 MED ORDER — AMLODIPINE BESYLATE 10 MG PO TABS
10.0000 mg | ORAL_TABLET | Freq: Every day | ORAL | Status: DC
Start: 2021-05-28 — End: 2021-05-30
  Administered 2021-05-28 – 2021-05-29 (×2): 10 mg via ORAL
  Filled 2021-05-28 (×2): qty 1

## 2021-05-28 MED ORDER — CLOPIDOGREL BISULFATE 75 MG PO TABS
75.0000 mg | ORAL_TABLET | Freq: Every day | ORAL | Status: DC
  Administered 2021-05-29 – 2021-05-30 (×2): 75 mg via ORAL
  Filled 2021-05-28 (×2): qty 1

## 2021-05-28 MED ORDER — HEPARIN SODIUM (PORCINE) 5000 UNIT/ML IJ SOLN
5000.0000 [IU] | Freq: Three times a day (TID) | INTRAMUSCULAR | Status: DC
  Administered 2021-05-29 – 2021-05-30 (×4): 5000 [IU] via SUBCUTANEOUS
  Filled 2021-05-28 (×4): qty 1

## 2021-05-28 MED ORDER — EZETIMIBE 10 MG PO TABS
10.0000 mg | ORAL_TABLET | Freq: Every evening | ORAL | Status: DC
  Administered 2021-05-28 – 2021-05-29 (×2): 10 mg via ORAL
  Filled 2021-05-28 (×3): qty 1

## 2021-05-28 MED ORDER — ASPIRIN 81 MG PO TBEC
81.0000 mg | DELAYED_RELEASE_TABLET | Freq: Every day | ORAL | Status: DC
  Administered 2021-05-29 – 2021-05-30 (×2): 81 mg via ORAL
  Filled 2021-05-28 (×3): qty 1

## 2021-05-28 MED ORDER — ONDANSETRON HCL 4 MG/2ML IJ SOLN: 4.0000 mg | Freq: Four times a day (QID) | INTRAMUSCULAR | Status: DC | PRN

## 2021-05-28 MED ORDER — ALPRAZOLAM 0.25 MG PO TABS
1.0000 mg | ORAL_TABLET | Freq: Three times a day (TID) | ORAL | Status: DC
Start: 2021-05-28 — End: 2021-05-30
  Administered 2021-05-28 – 2021-05-30 (×5): 1 mg via ORAL
  Filled 2021-05-28 (×5): qty 4

## 2021-05-28 MED ORDER — IRBESARTAN 300 MG PO TABS
300.0000 mg | ORAL_TABLET | Freq: Every day | ORAL | Status: DC
  Administered 2021-05-29 – 2021-05-30 (×2): 300 mg via ORAL
  Filled 2021-05-28 (×2): qty 1

## 2021-05-28 MED ORDER — HYDRALAZINE HCL 50 MG PO TABS
50.0000 mg | ORAL_TABLET | Freq: Three times a day (TID) | ORAL | Status: DC
  Administered 2021-05-28 – 2021-05-30 (×5): 50 mg via ORAL
  Filled 2021-05-28 (×5): qty 1

## 2021-05-28 MED ORDER — ONDANSETRON HCL 4 MG PO TABS: 4.0000 mg | ORAL_TABLET | Freq: Four times a day (QID) | ORAL | Status: DC | PRN

## 2021-05-28 MED ORDER — IOHEXOL 9 MG/ML PO SOLN
ORAL | Status: AC
  Administered 2021-05-28: 500 mL
  Filled 2021-05-28: qty 1000

## 2021-05-28 NOTE — ED Triage Notes (Signed)
The pt was bitten by a tick one month ago  he has been seen by [facility] because he haS A FEVER AND HE FEELS TERRIBLE    he  waS BITTEN BY 8 TICKS   the last tick was removed 3 or 4 ticks from thew initial ticks  it was on the back of his rt thich red and inflamned

## 2021-05-28 NOTE — Subjective & Objective (Signed)
CC: persistent fevers x 3 weeks HPI: 51 year old Caucasian male history of obesity, hypertension, schizoaffective disorder, coronary disease status post stent, hyperlipidemia, chronic tobacco abuse presents the ER today with persistent fevers for the last 3 weeks.  He states that he found a deer tick on his body on May 04, 2021.  He removed the tick.  This was on his right lower leg behind his popliteal fossa.  He developed fevers on May 06, 2021.  He thought this was a regular tickborne illness as he is had this in the past.  He started doxycycline on May 4 as he had some leftover from a previous prescription.  He was seen by his primary care doctor and was told that this was cellulitis and was started on p.o. Keflex.  He went to the ER at [facility] that weekend.  He was started on IV doxycycline and then transition to p.o.'s doxycycline.  He has been on doxycycline for the last 3 weeks.  He has not noted any improvement in his fever.  He has fevers up to 103 every single day.  Of note, the patient has had intermittent fevers every month for the last 2 years.  He states that he will have 2 to 3 days of fevers of 103.  This usually gets better with Tylenol.  He has a's every single month.  Is never had a work-up for this before.  Patient denies any IV drug use.  Denies any history of cancer.  He has no history of lymphoma.  In the ER, temp 98, heart rate 68, blood pressure 164/96  Labs lactic acid mildly elevated 2.2 blood cultures x2 were obtained  Sodium 139, potassium 3.8, BUN of 12, creatinine 1.0  AST of 70, ALT of 90, alk phos of 79  White count 9.7, hemoglobin 15.8  [place] spotted fever antibodies were drawn along with Lyme disease titers.  EDP discussed the case with infectious disease Dr. Lucianne Lei dam who recommended patient be admitted to hospital.  [facility] hospitalist contacted for admission.

## 2021-05-28 NOTE — Assessment & Plan Note (Signed)
Pt not on statin due to intolerance. Elevated LFTs not a common side effect of the meds that he is taking. Repeat CMP in AM.

## 2021-05-28 NOTE — ED Provider Triage Note (Cosign Needed Addendum)
Emergency Medicine Provider Triage Evaluation Note  Cameron Bryan , a 51 y.o. male  was evaluated in triage.  Pt complains of fatigue, weakness, myalgia, nausea, vomiting, lack of appetite, general malaise after being bitten by a number of ticks around a month ago.  Patient has been seen and evaluated multiple times since then for Lyme, [facility], has received multiple rounds of doxycycline, has only 4 days left in his current prescription is not feeling any better.  Patient reports that he was supposed to get results of Lyme and RMSF 5 days after being tested but has been several weeks at this point and results are still not available at his primary care office so he is feeling at the end of his rope.  Reports that he is being referred to infectious disease but is not sure how long it is going to take and "needs help now".  Reports that he had some skin redness, and tenderness that was diagnosed as cellulitis after the infection.  He has had no maculopapular rash, and no targetoid lesion that he remembers.  Review of Systems  Positive: As above Negative: As above  Physical Exam  BP (!) 164/96   Pulse 68   Temp 98 F (36.7 C)   Resp 18   Ht 5\' 9"  (1.753 m)   Wt 113.6 kg   SpO2 96%   BMI 36.98 kg/m  Gen:   Awake, anxious Resp:  Normal effort  MSK:   Moves extremities without difficulty  Other:    Medical Decision Making  Medically screening exam initiated at 4:39 PM.  Appropriate orders placed.  was informed that the remainder of the evaluation will be completed by another provider, this initial triage assessment does not replace that evaluation, and the importance of remaining in the ED until their evaluation is complete.  Workup initiated   [name], PA-C 05/28/21 1641    05/30/21, Olene Floss 05/28/21 1641

## 2021-05-28 NOTE — H&P (Addendum)
History and Physical    Cameron Bryan [id number]:[id number] DOB: [birth year] DOA: 05/28/2021  DOS: the patient was seen and examined on 05/28/2021  PCP: Ronita Hipps, MD   Patient coming from: Home  I have personally briefly reviewed patient's old medical records in [place]  CC: persistent fevers x 3 weeks HPI: 51 year old Caucasian male history of obesity, hypertension, schizoaffective disorder, coronary disease status post stent, hyperlipidemia, chronic tobacco abuse presents the ER today with persistent fevers for the last 3 weeks.  He states that he found a deer tick on his body on May 04, 2021.  He removed the tick.  This was on his right lower leg behind his popliteal fossa.  He developed fevers on May 06, 2021.  He thought this was a regular tickborne illness as he is had this in the past.  He started doxycycline on May 4 as he had some leftover from a previous prescription.  He was seen by his primary care doctor and was told that this was cellulitis and was started on p.o. Keflex.  He went to the ER at [facility] that weekend.  He was started on IV doxycycline and then transition to p.o.'s doxycycline.  He has been on doxycycline for the last 3 weeks.  He has not noted any improvement in his fever.  He has fevers up to 103 every single day.  Of note, the patient has had intermittent fevers every month for the last 2 years.  He states that he will have 2 to 3 days of fevers of 103.  This usually gets better with Tylenol.  He has a's every single month.  Is never had a work-up for this before.  Patient denies any IV drug use.  Denies any history of cancer.  He has no history of lymphoma.  In the ER, temp 98, heart rate 68, blood pressure 164/96  Labs lactic acid mildly elevated 2.2 blood cultures x2 were obtained  Sodium 139, potassium 3.8, BUN of 12, creatinine 1.0  AST of 70, ALT of 90, alk phos of 79  White count 9.7, hemoglobin 15.8  [place] spotted fever  antibodies were drawn along with Lyme disease titers.  EDP discussed the case with infectious disease Dr. Lucianne Lei dam who recommended patient be admitted to hospital.  [facility] hospitalist contacted for admission.   ED Course: Labs reassuring.  Afebrile.  EDP discussed the case with infectious disease who wants the patient admitted for workup.  Review of Systems:  Review of Systems  Constitutional:  Positive for fever. Negative for chills and malaise/fatigue.  HENT: Negative.    Eyes: Negative.   Respiratory: Negative.    Cardiovascular: Negative.   Gastrointestinal: Negative.   Genitourinary: Negative.   Musculoskeletal: Negative.   Skin:  Positive for rash.  Neurological: Negative.   Endo/Heme/Allergies:        +weight gain. Mostly in abd area  Psychiatric/Behavioral: Negative.    All other systems reviewed and are negative.  Past Medical History:  Diagnosis Date   Abnormal ECG    Acute pain of left wrist 04/19/2019   Angina pectoris ([place]) 08/29/2019   Bipolar 1 disorder ([place])    Blurred vision    Cephalalgia    Chest pain 06/05/2014   Chest tightness 07/23/2018   Chronic coronary artery disease 09/26/2019   Cigarette smoker 07/23/2018   Common migraine    Concussion without loss of consciousness 11/24/2015   COPD (chronic obstructive pulmonary disease) ([place])  Depression    EKG abnormalities 06/05/2014   Encounter for orthopedic follow-up care 05/17/2019   Essential hypertension    Ganglion cyst of volar aspect of left wrist 05/06/2019   GERD (gastroesophageal reflux disease)    Headache    Hypertension    Hypokalemia 10/11/2020   Mixed dyslipidemia 12/04/2018   Obesity (BMI 30-39.9) 10/11/2020   Pedal edema 07/23/2018   Post concussion syndrome 11/24/2015   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[place])    Schizoaffective disorder ([place])    Statin intolerance 10/11/2020   Strain of peroneal tendon 04/13/2020   Stroke ([place])    Vision changes 06/05/2014    Past Surgical History:   Procedure Laterality Date   CORONARY STENT INTERVENTION N/A 09/16/2019   Procedure: CORONARY STENT INTERVENTION;  Surgeon: Belva Crome, MD;  Location: [facility];  Service: Cardiovascular;  Laterality: N/A;  distal cfx    CYST REMOVAL HAND     LEFT HEART CATH AND CORONARY ANGIOGRAPHY N/A 09/16/2019   Procedure: LEFT HEART CATH AND CORONARY ANGIOGRAPHY;  Surgeon: Belva Crome, MD;  Location: [facility];  Service: Cardiovascular;  Laterality: N/A;     reports that he has been smoking. He has been smoking an average of 1 pack per day. He has never used smokeless tobacco. He reports current alcohol use of about 12.0 standard drinks per week. He reports that he does not use drugs.  Allergies  Allergen Reactions   Prednisone Shortness Of Breath    Mental reaction Other reaction(s): Vasculitis     Atorvastatin     Memory problem   Chantix [Varenicline Tartrate]     Loss of migraine and vision   Oxycodone Itching    Itching   Triamcinolone Acetonide Other (See Comments)   Valtrex [Valacyclovir Hcl]     Auditory hallucinations/ voices    Family History  Problem Relation Age of Onset   Alzheimer's disease Paternal Grandfather     Prior to Admission medications   Medication Sig Start Date End Date Taking? Authorizing Provider  ALPRAZolam Duanne Moron) 1 MG tablet Take 1 mg by mouth 3 (three) times daily. 10/28/15  Yes [provider]  amLODipine (NORVASC) 10 MG tablet Take 10 mg by mouth at bedtime.    Yes [provider]  aspirin EC 81 MG tablet Take 1 tablet (81 mg total) by mouth daily. Swallow whole. 09/16/19  Yes Reino Bellis B, NP  clopidogrel (PLAVIX) 75 MG tablet TAKE 1 TABLET BY MOUTH  DAILY WITH BREAKFAST 01/25/21  Yes Revankar, Reita Cliche, MD  co-enzyme Q-10 30 MG capsule Take 30 mg by mouth 3 (three) times daily.   Yes [provider]  divalproex (DEPAKOTE) 250 MG DR tablet Take 250 mg by mouth 2 (two) times daily. 01/29/20  Yes  [provider]  EDARBI 40 MG TABS Take 40 mg by mouth daily. 04/12/21  Yes [provider]  ezetimibe (ZETIA) 10 MG tablet Take 10 mg by mouth every evening.   Yes [provider]  furosemide (LASIX) 20 MG tablet Take 20 mg by mouth daily as needed for fluid.   Yes [provider]  hydrALAZINE (APRESOLINE) 50 MG tablet Take 50 mg by mouth 3 (three) times daily.   Yes [provider]  meloxicam (MOBIC) 15 MG tablet Take 15 mg by mouth as needed for pain.   Yes [provider]  metoprolol succinate (TOPROL-XL) 100 MG 24 hr tablet Take 100 mg by mouth daily. 06/15/20  Yes [provider]  Multiple Vitamins-Minerals (MULTIVITAMIN WITH MINERALS) tablet Take 1 tablet by mouth daily.   Yes [provider]  nitroGLYCERIN (NITROSTAT) 0.4 MG SL tablet Place 0.4 mg under the tongue every 5 (five) minutes as needed for chest pain. Every 5 minutes   Yes [provider]  ondansetron (ZOFRAN) 8 MG tablet Take 8 mg by mouth 3 (three) times daily. 03/13/20  Yes [provider]  pantoprazole (PROTONIX) 40 MG tablet Take 40 mg by mouth at bedtime.  04/08/18  Yes [provider]  potassium chloride (KLOR-CON) 10 MEQ tablet Take 20 mEq by mouth 2 (two) times daily. 10/31/18  Yes [provider]  Bempedoic Acid-Ezetimibe (NEXLIZET) 180-10 MG TABS Take 1 tablet by mouth daily. 05/03/21   Revankar, Reita Cliche, MD  EPINEPHrine 0.3 mg/0.3 mL IJ SOAJ injection Inject 0.3 mg into the muscle as needed for anaphylaxis. 08/07/20   [provider]  SUMAtriptan (IMITREX) 100 MG tablet Take 100 mg by mouth every 2 (two) hours as needed for migraine.  08/29/18   [provider]  vitamin E 45 MG (100 UNITS) capsule Take 100 Units by mouth daily.    [provider]    Physical Exam: Vitals:   05/28/21 1845 05/28/21 1930 05/28/21 1956 05/28/21 2015  BP: (!) 179/95 (!) 171/97  (!) 170/86  Pulse: 79 77  73   Resp: (!) 24 (!) 22  (!) 23  Temp:   98.7 F (37.1 C)   SpO2: 99% 97%  96%  Weight:      Height:        Physical Exam Vitals and nursing note reviewed.  Constitutional:      General: He is not in acute distress.    Appearance: Normal appearance. He is obese. He is not ill-appearing, toxic-appearing or diaphoretic.  HENT:     Head: Normocephalic and atraumatic.     Nose: Nose normal. No rhinorrhea.  Neck:      Comments: No submandibular or sub mental adenopathy No supraclavicular adenopathy Cardiovascular:     Rate and Rhythm: Normal rate and regular rhythm.     Pulses: Normal pulses.  Pulmonary:     Effort: Pulmonary effort is normal. No respiratory distress.     Breath sounds: No wheezing or rales.  Abdominal:     General: Bowel sounds are normal. There is no distension.     Palpations: Abdomen is soft.     Tenderness: There is no abdominal tenderness. There is no guarding or rebound.  Musculoskeletal:     Right lower leg: No edema.     Left lower leg: No edema.  Lymphadenopathy:     Cervical: Cervical adenopathy present.     Left cervical: Posterior cervical adenopathy present.  Skin:    General: Skin is warm and dry.     Capillary Refill: Capillary refill takes less than 2 seconds.       Neurological:     General: No focal deficit present.     Mental Status: He is alert and oriented to person, place, and time.     Labs on Admission: I have personally reviewed following labs and imaging studies  CBC: Recent Labs  Lab 05/28/21 1643  WBC 9.7  HGB 15.8  HCT 44.9  MCV 90.9  PLT 161   Basic Metabolic Panel: Recent Labs  Lab 05/28/21 1643  NA 139  K 3.8  CL 103  CO2 27  GLUCOSE 81  BUN 12  CREATININE 1.02  CALCIUM 9.0   GFR: Estimated Creatinine Clearance: 108.9 mL/min (by C-G formula based on SCr of 1.02 mg/dL). Liver Function Tests: Recent Labs  Lab 05/28/21 1643  AST 70*  ALT 90*  ALKPHOS 79  BILITOT 0.1*  PROT 6.8  ALBUMIN 3.6   No  results for input(s): LIPASE, AMYLASE in the last 168 hours. No results for input(s): AMMONIA in the last 168 hours. Coagulation Profile: No results for input(s): INR, PROTIME in the last 168 hours. Cardiac Enzymes: No results for input(s): CKTOTAL, CKMB, CKMBINDEX, TROPONINI, TROPONINIHS in the last 168 hours. BNP (last 3 results) No results for input(s): PROBNP in the last 8760 hours. HbA1C: No results for input(s): HGBA1C in the last 72 hours. CBG: No results for input(s): GLUCAP in the last 168 hours. Lipid Profile: No results for input(s): CHOL, HDL, LDLCALC, TRIG, CHOLHDL, LDLDIRECT in the last 72 hours. Thyroid Function Tests: No results for input(s): TSH, T4TOTAL, FREET4, T3FREE, THYROIDAB in the last 72 hours. Anemia Panel: No results for input(s): VITAMINB12, FOLATE, FERRITIN, TIBC, IRON, RETICCTPCT in the last 72 hours. Urine analysis:    Component Value Date/Time   COLORURINE YELLOW 07/26/2007 1700   APPEARANCEUR CLEAR 07/26/2007 1700   LABSPEC 1.020 07/26/2007 1700   PHURINE 5.5 07/26/2007 1700   GLUCOSEU NEGATIVE 07/26/2007 1700   HGBUR NEGATIVE 07/26/2007 1700   BILIRUBINUR NEGATIVE 07/26/2007 1700   KETONESUR NEGATIVE 07/26/2007 1700   PROTEINUR NEGATIVE 07/26/2007 1700   UROBILINOGEN 0.2 07/26/2007 1700   NITRITE NEGATIVE 07/26/2007 1700   LEUKOCYTESUR  07/26/2007 1700    NEGATIVE MICROSCOPIC NOT DONE ON URINES WITH NEGATIVE PROTEIN, BLOOD, LEUKOCYTES, NITRITE, OR GLUCOSE <1000 mg/dL.    Radiological Exams on Admission: I have personally reviewed images No results found.  EKG: My personal interpretation of EKG shows: NSR     Assessment/Plan Principal Problem:   FUO (fever of unknown origin) Active Problems:   Posterior cervical adenopathy   Schizoaffective disorder (HCC)   Benign essential HTN   Cigarette smoker   Mixed dyslipidemia   COPD (chronic obstructive pulmonary disease) (HCC)   GERD (gastroesophageal reflux disease)   Obesity (BMI  30-39.9)   Coronary artery disease   Elevated LFTs    Assessment and Plan: * FUO (fever of unknown origin) Observation med/surg bed. Check LDH, CRP, ESR, CK, RF, ANA, uric acid, Quant Gold. Check CT chest/abd/pelvis.  Check echo to eval for SBE. Pt has had intermittent fevers for at least 2 years now. Has 2-3 days every month that usually improve with tylenol.  This month, he has had 22 straight days of persistent fevers. Has already been treated with 2 weeks with doxycycline without any improvement.  Sounds more like cyclic fevers.  ID to see patient in AM. HIV test has already been sent from ER.  FUO (fever of unknown origin) is a Acute illness/condition that poses a threat to life or bodily function.  Hold tylenol and motrin to allow for tracking of his fever curve.   Posterior cervical adenopathy New. If LDH is elevated, may need excisional vs needle biopsy of left posterior cervical lymph node.  Elevated LFTs Pt not on statin due to intolerance. Elevated LFTs not a common side effect of the meds that he is taking. Repeat CMP in AM.  Coronary artery disease Stable. On Asa 81 mg, plavix 75 mg, toprol-xl 100 mg  Obesity (BMI 30-39.9) Chronic. BMI 36.98  GERD (gastroesophageal reflux disease) Stable. On protonix 40 mg.  COPD (chronic obstructive pulmonary disease) ([place])  Stable.   Mixed dyslipidemia Stable. On nexlizet. Pt has statin intolerance.  Cigarette smoker Chronic.   Benign essential HTN Stable. Continue with norvasc 10 mg, toprol-xl 100 mg, hydralazine 25 mg  Schizoaffective disorder (HCC) Stable. Pt states he is no longer on Depakote.   DVT prophylaxis: SQ Heparin Code Status: Full Code Family Communication: discussed with pt and wife susan at bedside  Disposition Plan: return home  Consults called: EDP has consulted ID Dr. Tommy Medal  Admission status: Observation, Med-Surg   Kristopher Oppenheim, DO [facility] 05/28/2021, 9:38 PM

## 2021-05-28 NOTE — Assessment & Plan Note (Addendum)
Observation med/surg bed. Check LDH, CRP, ESR, CK, RF, ANA, uric acid, Quant Gold. Check CT chest/abd/pelvis.  Check echo to eval for SBE. Pt has had intermittent fevers for at least 2 years now. Has 2-3 days every month that usually improve with tylenol.  This month, he has had 22 straight days of persistent fevers. Has already been treated with 2 weeks with doxycycline without any improvement.  Sounds more like cyclic fevers.  ID to see patient in AM. HIV test has already been sent from ER.  FUO (fever of unknown origin) is a Acute illness/condition that poses a threat to life or bodily function.  Hold tylenol and motrin to allow for tracking of his fever curve.

## 2021-05-28 NOTE — Assessment & Plan Note (Signed)
Chronic. BMI 36.98

## 2021-05-28 NOTE — ED Notes (Signed)
At present the pt is waiting on   a lactic acid   if its ok he is going home according to dr Freida Busman  a and o x 4  wife at  the bedside

## 2021-05-28 NOTE — Assessment & Plan Note (Signed)
Chronic. 

## 2021-05-28 NOTE — Assessment & Plan Note (Signed)
New. If LDH is elevated, may need excisional vs needle biopsy of left posterior cervical lymph node.

## 2021-05-28 NOTE — Assessment & Plan Note (Signed)
Stable

## 2021-05-28 NOTE — Assessment & Plan Note (Signed)
Stable. On nexlizet. Pt has statin intolerance.

## 2021-05-28 NOTE — ED Provider Notes (Addendum)
[facility] EMERGENCY DEPARTMENT Provider Note   CSN: [id number] Arrival date & time: 05/28/21  1532     History  Chief Complaint  Patient presents with   Fever    Cameron Bryan is a 51 y.o. male.  51 year old male who presents with fever times several weeks.  Symptoms started after receiving multiple tick bites.  Has been seen by his physician for this and placed on doxycycline.  He is had multiple tick borne illness titers done.  Still waiting for results of that.  He denies any severe headache or neck pain.  No photophobia.  No abdominal or chest discomfort.  Has had some mild myalgias.  Temperature has been up to 103.  Is responsive to Tylenol.  He denies any urinary or pulmonary symptoms.  States has not had a rash recently.      Home Medications Prior to Admission medications   Medication Sig Start Date End Date Taking? Authorizing Provider  ALPRAZolam Duanne Moron) 1 MG tablet Take 1 mg by mouth 3 (three) times daily as needed for anxiety.  10/28/15   [provider]  amLODipine (NORVASC) 10 MG tablet Take 10 mg by mouth at bedtime.     [provider]  aspirin EC 81 MG tablet Take 1 tablet (81 mg total) by mouth daily. Swallow whole. 09/16/19   Cheryln Manly, NP  Bempedoic Acid-Ezetimibe (NEXLIZET) 180-10 MG TABS Take 1 tablet by mouth daily. 05/03/21   Revankar, Reita Cliche, MD  clopidogrel (PLAVIX) 75 MG tablet TAKE 1 TABLET BY MOUTH  DAILY WITH BREAKFAST 01/25/21   Revankar, Reita Cliche, MD  co-enzyme Q-10 30 MG capsule Take 30 mg by mouth 3 (three) times daily.    [provider]  divalproex (DEPAKOTE) 250 MG DR tablet Take 250 mg by mouth 2 (two) times daily. 01/29/20   [provider]  EDARBI 40 MG TABS Take 40 mg by mouth daily. 04/12/21   [provider]  EPINEPHrine 0.3 mg/0.3 mL IJ SOAJ injection Inject 0.3 mg into the muscle as needed for anaphylaxis. 08/07/20   [provider]  furosemide (LASIX) 20 MG tablet  Take 20 mg by mouth 2 (two) times daily.    [provider]  hydrALAZINE (APRESOLINE) 25 MG tablet Take 25 mg by mouth 3 (three) times daily. 02/25/21   [provider]  meloxicam (MOBIC) 15 MG tablet Take 15 mg by mouth as needed for pain.    [provider]  metoprolol succinate (TOPROL-XL) 100 MG 24 hr tablet Take 100 mg by mouth daily. 06/15/20   [provider]  Multiple Vitamins-Minerals (MULTIVITAMIN WITH MINERALS) tablet Take 1 tablet by mouth daily.    [provider]  nitroGLYCERIN (NITROSTAT) 0.4 MG SL tablet Place 0.4 mg under the tongue as needed for chest pain. Every 5 minutes    [provider]  ondansetron (ZOFRAN) 8 MG tablet Take 8 mg by mouth 3 (three) times daily. 03/13/20   [provider]  pantoprazole (PROTONIX) 40 MG tablet Take 40 mg by mouth at bedtime.  04/08/18   [provider]  potassium chloride (KLOR-CON) 10 MEQ tablet Take 40 mEq by mouth 2 (two) times daily.  10/31/18   [provider]  sucralfate (CARAFATE) 1 g tablet Take 1 g by mouth 4 (four) times daily.    [provider]  SUMAtriptan (IMITREX) 100 MG tablet Take 100 mg by mouth every 2 (two) hours as needed for migraine.  08/29/18  [provider]  vitamin E 45 MG (100 UNITS) capsule Take 100 Units by mouth daily.    [provider]      Allergies    Prednisone, Atorvastatin, Chantix [varenicline tartrate], Oxycodone, Triamcinolone acetonide, and Valtrex [valacyclovir hcl]    Review of Systems   Review of Systems  All other systems reviewed and are negative.  Physical Exam Updated Vital Signs BP (!) 164/96   Pulse 68   Temp 98 F (36.7 C)   Resp 18   Ht 1.753 m (5\' 9" )   Wt 113.6 kg   SpO2 96%   BMI 36.98 kg/m  Physical Exam Vitals and nursing note reviewed.  Constitutional:      General: He is not in acute distress.    Appearance: Normal appearance. He is well-developed. He is not  toxic-appearing.  HENT:     Head: Normocephalic and atraumatic.  Eyes:     General: Lids are normal.     Conjunctiva/sclera: Conjunctivae normal.     Pupils: Pupils are equal, round, and reactive to light.  Neck:     Thyroid: No thyroid mass.     Trachea: No tracheal deviation.  Cardiovascular:     Rate and Rhythm: Normal rate and regular rhythm.     Heart sounds: Normal heart sounds. No murmur heard.   No gallop.  Pulmonary:     Effort: Pulmonary effort is normal. No respiratory distress.     Breath sounds: Normal breath sounds. No stridor. No decreased breath sounds, wheezing, rhonchi or rales.  Abdominal:     General: There is no distension.     Palpations: Abdomen is soft.     Tenderness: There is no abdominal tenderness. There is no rebound.  Musculoskeletal:        General: No tenderness. Normal range of motion.     Cervical back: Normal range of motion and neck supple.       Legs:  Skin:    General: Skin is warm and dry.     Findings: No abrasion or rash.  Neurological:     Mental Status: He is alert and oriented to person, place, and time. Mental status is at baseline.     GCS: GCS eye subscore is 4. GCS verbal subscore is 5. GCS motor subscore is 6.     Cranial Nerves: No cranial nerve deficit.     Sensory: No sensory deficit.     Motor: Motor function is intact.  Psychiatric:        Attention and Perception: Attention normal.        Speech: Speech normal.        Behavior: Behavior normal.    ED Results / Procedures / Treatments   Labs (all labs ordered are listed, but only abnormal results are displayed) Labs Reviewed  COMPREHENSIVE METABOLIC PANEL - Abnormal; Notable for the following components:      Result Value   AST 70 (*)    ALT 90 (*)    Total Bilirubin 0.1 (*)    All other components within normal limits  CBC  URINALYSIS, ROUTINE W REFLEX MICROSCOPIC  ROCKY MTN SPOTTED FVR ABS PNL(IGG+IGM)  LYME DISEASE SEROLOGY W/REFLEX     EKG None  Radiology No results found.  Procedures Procedures    Medications Ordered in ED Medications - No data to display  ED Course/ Medical Decision Making/ A&P  Medical Decision Making Amount and/or Complexity of Data Reviewed Labs: ordered.   Patient without leukocytosis on his CBC.  patient's chemistry panel is significant for mild elevated liver function test.  We will add hepatitis panel.  Patient does have mildly elevated lactate at 2.2.  Concern for infectious etiology.  Does have elevated lactate here.  Case discussed with infectious disease doctor on-call.  Patient has now had fever for over 2 weeks.  He recommends patient be admitted for work-up of fever of unknown origin.  Will consult hospitalist for admission.  Blood cultures have been obtained.  Plan discussed with patient and his wife who are in agreement.        Final Clinical Impression(s) / ED Diagnoses Final diagnoses:  None    Rx / DC Orders ED Discharge Orders     None         Lacretia Leigh, MD 05/28/21 2047    Lacretia Leigh, MD 05/28/21 2048

## 2021-05-28 NOTE — Assessment & Plan Note (Signed)
Stable. On protonix 40 mg.

## 2021-05-28 NOTE — Assessment & Plan Note (Signed)
Stable. Pt states he is no longer on Depakote.

## 2021-05-28 NOTE — Assessment & Plan Note (Signed)
Stable. On Asa 81 mg, plavix 75 mg, toprol-xl 100 mg

## 2021-05-28 NOTE — Assessment & Plan Note (Signed)
Stable. Continue with norvasc 10 mg, toprol-xl 100 mg, hydralazine 25 mg

## 2021-05-29 ENCOUNTER — Encounter (HOSPITAL_COMMUNITY): Payer: Self-pay | Admitting: Internal Medicine

## 2021-05-29 ENCOUNTER — Observation Stay (HOSPITAL_COMMUNITY): Payer: 59

## 2021-05-29 ENCOUNTER — Observation Stay (HOSPITAL_BASED_OUTPATIENT_CLINIC_OR_DEPARTMENT_OTHER): Payer: 59

## 2021-05-29 DIAGNOSIS — M25562 Pain in left knee: Secondary | ICD-10-CM

## 2021-05-29 DIAGNOSIS — R509 Fever, unspecified: Secondary | ICD-10-CM

## 2021-05-29 DIAGNOSIS — W57XXXA Bitten or stung by nonvenomous insect and other nonvenomous arthropods, initial encounter: Secondary | ICD-10-CM

## 2021-05-29 DIAGNOSIS — G8929 Other chronic pain: Secondary | ICD-10-CM

## 2021-05-29 DIAGNOSIS — J449 Chronic obstructive pulmonary disease, unspecified: Secondary | ICD-10-CM | POA: Diagnosis not present

## 2021-05-29 DIAGNOSIS — I10 Essential (primary) hypertension: Secondary | ICD-10-CM | POA: Diagnosis not present

## 2021-05-29 DIAGNOSIS — A689 Relapsing fever, unspecified: Secondary | ICD-10-CM

## 2021-05-29 DIAGNOSIS — I2510 Atherosclerotic heart disease of native coronary artery without angina pectoris: Secondary | ICD-10-CM | POA: Diagnosis not present

## 2021-05-29 DIAGNOSIS — E669 Obesity, unspecified: Secondary | ICD-10-CM

## 2021-05-29 DIAGNOSIS — S80861A Insect bite (nonvenomous), right lower leg, initial encounter: Secondary | ICD-10-CM

## 2021-05-29 DIAGNOSIS — K219 Gastro-esophageal reflux disease without esophagitis: Secondary | ICD-10-CM

## 2021-05-29 HISTORY — DX: Other chronic pain: G89.29

## 2021-05-29 HISTORY — DX: Bitten or stung by nonvenomous insect and other nonvenomous arthropods, initial encounter: W57.XXXA

## 2021-05-29 HISTORY — DX: Pain in left knee: M25.562

## 2021-05-29 HISTORY — DX: Relapsing fever, unspecified: A68.9

## 2021-05-29 LAB — COMPREHENSIVE METABOLIC PANEL
ALT: 78 U/L — ABNORMAL HIGH (ref 0–44)
AST: 53 U/L — ABNORMAL HIGH (ref 15–41)
Albumin: 3.5 g/dL (ref 3.5–5.0)
Alkaline Phosphatase: 75 U/L (ref 38–126)
Anion gap: 10 (ref 5–15)
BUN: 13 mg/dL (ref 6–20)
CO2: 25 mmol/L (ref 22–32)
Calcium: 8.6 mg/dL — ABNORMAL LOW (ref 8.9–10.3)
Chloride: 103 mmol/L (ref 98–111)
Creatinine, Ser: 1.05 mg/dL (ref 0.61–1.24)
GFR, Estimated: >60 mL/min (ref >60)
Glucose, Bld: 103 mg/dL — ABNORMAL HIGH (ref 70–99)
Potassium: 3.4 mmol/L — ABNORMAL LOW (ref 3.5–5.1)
Sodium: 138 mmol/L (ref 135–145)
Total Bilirubin: 0.4 mg/dL (ref 0.3–1.2)
Total Protein: 6.6 g/dL (ref 6.5–8.1)

## 2021-05-29 LAB — CBC WITH DIFFERENTIAL/PLATELET
Abs Immature Granulocytes: 0.02 10*3/uL (ref 0.00–0.07)
Basophils Absolute: 0 10*3/uL (ref 0.0–0.1)
Basophils Relative: 0 %
Eosinophils Absolute: 0.4 10*3/uL (ref 0.0–0.5)
Eosinophils Relative: 4 %
HCT: 42.9 % (ref 39.0–52.0)
Hemoglobin: 15.1 g/dL (ref 13.0–17.0)
Immature Granulocytes: 0 %
Lymphocytes Relative: 32 %
Lymphs Abs: 2.7 10*3/uL (ref 0.7–4.0)
MCH: 31.9 pg (ref 26.0–34.0)
MCHC: 35.2 g/dL (ref 30.0–36.0)
MCV: 90.7 fL (ref 80.0–100.0)
Monocytes Absolute: 1 10*3/uL (ref 0.1–1.0)
Monocytes Relative: 12 %
Neutro Abs: 4.3 10*3/uL (ref 1.7–7.7)
Neutrophils Relative %: 52 %
Platelets: 192 10*3/uL (ref 150–400)
RBC: 4.73 MIL/uL (ref 4.22–5.81)
RDW: 12.8 % (ref 11.5–15.5)
WBC: 8.4 10*3/uL (ref 4.0–10.5)
nRBC: 0 % (ref 0.0–0.2)

## 2021-05-29 LAB — HEPATITIS PANEL, ACUTE
HCV Ab: NONREACTIVE
Hep A IgM: NONREACTIVE
Hep B C IgM: NONREACTIVE
Hepatitis B Surface Ag: NONREACTIVE

## 2021-05-29 LAB — SEDIMENTATION RATE: Sed Rate: 12 mm/hr (ref 0–16)

## 2021-05-29 LAB — ECHOCARDIOGRAM COMPLETE
AR max vel: 3.31 cm2
AV Area VTI: 3.4 cm2
AV Area mean vel: 3.24 cm2
AV Mean grad: 7 mmHg
AV Peak grad: 13.7 mmHg
Ao pk vel: 1.85 m/s
Area-P 1/2: 3.6 cm2
Height: 69 in
S' Lateral: 2.9 cm
Weight: 3982.39 oz

## 2021-05-29 LAB — LACTATE DEHYDROGENASE: LDH: 148 U/L (ref 98–192)

## 2021-05-29 LAB — C-REACTIVE PROTEIN: CRP: 1.2 mg/dL — ABNORMAL HIGH (ref <1.0)

## 2021-05-29 LAB — LACTIC ACID, PLASMA: Lactic Acid, Venous: 1.4 mmol/L (ref 0.5–1.9)

## 2021-05-29 LAB — SARS CORONAVIRUS 2 BY RT PCR: SARS Coronavirus 2 by RT PCR: NEGATIVE

## 2021-05-29 MED ORDER — LABETALOL HCL 5 MG/ML IV SOLN
10.0000 mg | INTRAVENOUS | Status: DC | PRN
  Administered 2021-05-29: 10 mg via INTRAVENOUS
  Filled 2021-05-29: qty 4

## 2021-05-29 MED ORDER — IOHEXOL 300 MG/ML  SOLN
100.0000 mL | Freq: Once | INTRAMUSCULAR | Status: AC | PRN
  Administered 2021-05-29: 100 mL via INTRAVENOUS

## 2021-05-29 MED ORDER — POTASSIUM CHLORIDE CRYS ER 20 MEQ PO TBCR
40.0000 meq | EXTENDED_RELEASE_TABLET | Freq: Once | ORAL | Status: AC
  Administered 2021-05-29: 40 meq via ORAL
  Filled 2021-05-29: qty 2

## 2021-05-29 NOTE — Consult Note (Signed)
Date of Admission:  05/28/2021          Reason for Consult: FUO    Referring Provider: Eric [place], MD   Assessment:   FUO  Recent tick bites Apparent history of recurrent monthly fevers Dental pain Knee where he has a "knot that has been present for several weeks Hypertension Schizoaffective disorder Coronary artery disease  Plan:  CT chest and abdomen and pelvis was unremarkable HIV antibody and hepatitis panel was negative I am ordering panel of FUO labs including ANA sed rate CRP rheumatoid factor QuantiFERON gold, Epstein-Barr and CMV serologies LDH SPEP cryoglobulins Would follow-up his culture data Ordering a Panorex of his teeth Order x-ray of his knees  Principal Problem:   FUO (fever of unknown origin) Active Problems:   Schizoaffective disorder (HCC)   Benign essential HTN   Cigarette smoker   Mixed dyslipidemia   COPD (chronic obstructive pulmonary disease) (HCC)   GERD (gastroesophageal reflux disease)   Obesity (BMI 30-39.9)   Coronary artery disease   Posterior cervical adenopathy   Elevated LFTs   Scheduled Meds:  ALPRAZolam  1 mg Oral TID   amLODipine  10 mg Oral QHS   aspirin EC  81 mg Oral Daily   clopidogrel  75 mg Oral Q breakfast   ezetimibe  10 mg Oral QPM   heparin  5,000 Units Subcutaneous Q8H   hydrALAZINE  50 mg Oral TID   irbesartan  300 mg Oral Daily   metoprolol succinate  100 mg Oral Daily   pantoprazole  40 mg Oral QHS   potassium chloride  40 mEq Oral Once   Continuous Infusions: PRN Meds:.labetalol, ondansetron **OR** ondansetron (ZOFRAN) IV  HPI: Cameron Bryan is a 51 y.o. male who has a history of coronary artery disease hypertension and schizoaffective disorder smoking who is also an avid hunter and sustained a head injury many years ago when he fell backwards on a deer hunting stand.  He claims to have been having monthly fevers that last for a few days now for several years.  They are accompanied largely by  myalgias and chills but then resolved.  They do not appear to have been worked up.  Apparently in early May he sustained multiple tick bites including 1 on the back of his leg that was engorged and he required him to remove it.  He then developed fevers on May 06, 2021.  He had doxycycline on hand and began this.  He was not having any improvement and saw his primary care physician who thought the patient had cellulitis behind his knee.  The patient himself was fairly skeptical of this but switched over to Keflex.  He continue to have fevers and ultimately seen in the ER at [facility] where he was given IV doxycycline followed by oral doxycycline.  He is continued on oral doxycycline but had continued fevers including up to 103 on the day of admission.  I was called by ER staff who felt that the patient needed to have an inpatient work-up for his fevers of unknown origin.  In particular the patient apparently had problems with his insurance needing to make prior approval with our clinic and this was going to push his appointment with [place] out for another week.  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  I did caution him about the risk of tularemia from skinning rabbits.  He has had tattoos in the past as well.  He is a smoker but has cut down quite a bit and not smoke at all in the last week.    He was admitted to the hospitalist service and blood cultures were taken CT of the chest abdomen pelvis was performed which was without any specific abnormality  Far blood cultures have been negative and his COVID test was negative.  He appears comfortable and nontoxic on exam today.  He has an area on his knee that he said popped up in the last several weeks but is not erythematous or clearly evidence of an infectious site.  We will get plain films of the knee for thoroughness.  He does also have an area  in his teeth that has been bothering him and I will get an orthopantogram.  There was some mention of posterior cervical lymphadenopathy and we could consider getting a CT of the head and neck as well.     I spent 84 minutes with the patient including than 50% of the time in face to face counseling of the patient and his fever unknown origin work-up personally reviewing CT chest abdomen pelvis along with updated blood cultures CBC CMP serologies along with review of medical records in preparation for the visit and during the visit and in coordination of his care.      Review of Systems: Review of Systems  Constitutional:  Positive for chills and fever. Negative for malaise/fatigue and weight loss.  HENT:  Negative for congestion and sore throat.   Eyes:  Negative for blurred vision and photophobia.  Respiratory:  Negative for cough, shortness of breath and wheezing.   Cardiovascular:  Negative for chest pain, palpitations and leg swelling.  Gastrointestinal:  Negative for abdominal pain, blood in stool, constipation, diarrhea, heartburn, melena, nausea and vomiting.  Genitourinary:  Negative for dysuria, flank pain and hematuria.  Musculoskeletal:  Negative for back pain, falls, joint pain and myalgias.  Skin:  Negative for itching and rash.  Neurological:  Negative for dizziness, focal weakness, loss of consciousness, weakness and headaches.  Endo/Heme/Allergies:  Does not bruise/bleed easily.  Psychiatric/Behavioral:  Negative for depression and suicidal ideas. The patient does not have insomnia.    Past Medical History:  Diagnosis Date   Abnormal ECG    Acute pain of left wrist 04/19/2019   Angina pectoris (HCC) 08/29/2019   Bipolar 1 disorder (HCC)    Blurred vision    Cephalalgia    Chest pain 06/05/2014   Chest tightness 07/23/2018   Chronic coronary artery disease 09/26/2019   Cigarette smoker 07/23/2018   Common migraine    Concussion without loss of consciousness  11/24/2015   COPD (chronic obstructive pulmonary disease) (HCC)    Depression    EKG abnormalities 06/05/2014   Encounter for orthopedic follow-up care 05/17/2019   Essential hypertension    Ganglion cyst of volar aspect of left wrist 05/06/2019   GERD (gastroesophageal reflux disease)    Headache    Hypertension    Hypokalemia 10/11/2020   Mixed dyslipidemia 12/04/2018   Obesity (BMI 30-39.9) 10/11/2020   Pedal edema 07/23/2018   Post concussion syndrome 11/24/2015   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HCC)    Schizoaffective disorder (HCC)    Statin intolerance 10/11/2020   Strain of peroneal tendon 04/13/2020   Stroke (HCC)    Vision changes 06/05/2014    Social History   Tobacco Use   Smoking status: Every Day    Packs/day: 1.00  Types: Cigarettes   Smokeless tobacco: Never  Substance Use Topics   Alcohol use: Yes    Alcohol/week: 12.0 standard drinks    Types: 12 Standard drinks or equivalent per week   Drug use: No    Family History  Problem Relation Age of Onset   Alzheimer's disease Paternal Grandfather    Allergies  Allergen Reactions   Prednisone Shortness Of Breath    Mental reaction Other reaction(s): Vasculitis     Atorvastatin     Memory problem   Chantix [Varenicline Tartrate]     Loss of migraine and vision   Oxycodone Itching    Itching   Triamcinolone Acetonide Other (See Comments)   Valtrex [Valacyclovir Hcl]     Auditory hallucinations/ voices    OBJECTIVE: Blood pressure (!) 150/88, pulse 80, temperature 98 F (36.7 C), temperature source Oral, resp. rate 18, height  (1.753 m), weight 112.9 kg, SpO2 99 %.  Physical Exam Constitutional:      Appearance: He is well-developed.  HENT:     Head: Normocephalic and atraumatic.     Mouth/Throat:     Lips: Pink.     Mouth: Mucous membranes are dry.     Dentition: Abnormal dentition.     Pharynx: Oropharynx is clear. Uvula midline. No pharyngeal swelling, oropharyngeal exudate, posterior  oropharyngeal erythema or uvula swelling.  Eyes:     Conjunctiva/sclera: Conjunctivae normal.  Cardiovascular:     Rate and Rhythm: Normal rate and regular rhythm.  Pulmonary:     Effort: Pulmonary effort is normal. No respiratory distress.     Breath sounds: Normal breath sounds. No stridor. No wheezing or rhonchi.  Abdominal:     General: There is no distension.     Palpations: Abdomen is soft. There is no mass.     Tenderness: There is no abdominal tenderness.     Hernia: No hernia is present.  Musculoskeletal:        General: No tenderness. Normal range of motion.     Cervical back: Normal range of motion and neck supple.  Skin:    General: Skin is warm and dry.     Coloration: Skin is not pale.     Findings: No erythema or rash.  Neurological:     General: No focal deficit present.     Mental Status: He is alert and oriented to person, place, and time.  Psychiatric:        Mood and Affect: Mood normal.        Behavior: Behavior normal.        Thought Content: Thought content normal.        Judgment: Judgment normal.   Healed tick bites Lab Results Lab Results  Component Value Date   WBC 8.4 05/29/2021   HGB 15.1 05/29/2021   HCT 42.9 05/29/2021   MCV 90.7 05/29/2021   PLT 192 05/29/2021    Lab Results  Component Value Date   CREATININE 1.05 05/29/2021   BUN 13 05/29/2021   NA 138 05/29/2021   K 3.4 (L) 05/29/2021   CL 103 05/29/2021   CO2 25 05/29/2021    Lab Results  Component Value Date   ALT 78 (H) 05/29/2021   AST 53 (H) 05/29/2021   ALKPHOS 75 05/29/2021   BILITOT 0.4 05/29/2021     Microbiology: Recent Results (from the past 240 hour(s))  Culture, blood (Routine X 2) w Reflex to ID Panel     Status: None (Preliminary result)  Collection Time: 05/28/21  6:46 PM   Specimen: BLOOD  Result Value Ref Range Status   Specimen Description BLOOD SITE NOT SPECIFIED  Final   Special Requests   Final    BOTTLES DRAWN AEROBIC AND ANAEROBIC Blood Culture  results may not be optimal due to an inadequate volume of blood received in culture bottles   Culture   Final    NO GROWTH < 12 HOURS Performed at [facility], [address], [place], [place] [address]    Report Status PENDING  Incomplete  Culture, blood (Routine X 2) w Reflex to ID Panel     Status: None (Preliminary result)   Collection Time: 05/28/21  9:35 PM   Specimen: BLOOD  Result Value Ref Range Status   Specimen Description BLOOD RIGHT ANTECUBITAL  Final   Special Requests   Final    BOTTLES DRAWN AEROBIC AND ANAEROBIC Blood Culture adequate volume   Culture   Final    NO GROWTH < 12 HOURS Performed at [facility], [address] [place], [place] [address]    Report Status PENDING  Incomplete  SARS Coronavirus 2 by RT PCR (hospital order, performed in [facility] hospital lab) *cepheid single result test* Anterior Nasal Swab     Status: None   Collection Time: 05/28/21 10:24 PM   Specimen: Anterior Nasal Swab  Result Value Ref Range Status   SARS Coronavirus 2 by RT PCR NEGATIVE NEGATIVE Final    Comment: Performed at [facility], [address] [place], [place] [address]    Acey Lav, MD [facility] [phone] pager  05/29/2021, 10:00 AM

## 2021-05-29 NOTE — Progress Notes (Signed)
PROGRESS NOTE    Cameron Bryan  FUX:[id number] DOB: [birth year] DOA: 05/28/2021 PCP: Ronita Hipps, MD    Brief Narrative:   Cameron Bryan is a 51 y.o. male with past medical history significant for HTN, schizoaffective disorder, CAD s/p PCI/stent, HLD, chronic tobacco abuse, obesity who presents to [facility] ED on 5/26 with persistent fevers over the last 3 weeks.  Patient reports he was recently performing tree work and subsequently had multiple ticks over his body including posterior to his right knee, anterior right thigh, and neck that he noted on May 04, 2021 and subsequently remove them.  Patient reports onset of fevers May 06, 2021 and development of a rash to his right lower extremity.  He started taking some old doxycycline from a previous prescription and was seen by his primary care doctor with concerns of cellulitis and was started on oral Keflex.  He then subsequently went to the ER at [facility] and was started on IV doxycycline and transition to p.o. doxycycline for concern of tickborne illness and has been on doxycycline over the last 3 weeks.  Patient denies any improvement of his fever, with temperature up to 103 F daily.   Patient also reports he has had intermittent fevers every month for the last 2 years that usually gets better with Tylenol, has never had this worked up previously.  Denies IV drug abuse, no history of cancer or lymphoma.  In the ED, temperature 98.0 F, HR 68, RR 18, BP 164/96, SPO2 96% on room air.  WBC count 9.7, hemoglobin 15.8, platelets 225.  Sodium 139, potassium 3.8, chloride 103, CO2 27, BUN 12, creatinine 1.02, glucose 81, AST 70, ALT 90, total bilirubin 0.1.  CRP 1.6, ESR 20.  Lactic acid 2.2.  Uric acid 5.0.  Acute hepatitis panel negative.  HIV nonreactive.  Urinalysis unrevealing.  [facility] spotted fever antibodies and Lyme disease titers, blood cultures x2 collected.  EDP discussed case with infectious disease, Dr. Drucilla Schmidt who recommended patient  admitted to the hospital service for further evaluation.  Hospitalist service consulted for admission for fever of unknown origin.  Assessment & Plan:   Fever of unknown origin Patient presenting with persistent fever over the last 3 weeks in the setting of multiple tick bites with associated reported rash.  Patient has been on doxycycline for 3 weeks and a course of Keflex for also concern of cellulitis.  Despite treatment patient reports continued intermittent fevers up to 103.0 F with poor response to Tylenol.  Although, patient also reports that he has had intermittent fevers; although less over the past 2 years that has never been worked up further.  Acute hepatitis panel, HIV nonreactive. --Infectious disease following, appreciate assistance --CRP 1.6>1.1 --ESR 20>12 --TTE: Pending --Left knee x-ray, Panorex: Pending --Blood cultures x2: Pending --Cryoglobin, ANCA profile, protein electrophoresis, rheumatoid factor, ANA, QuantiFERON gold, Lyme disease, [place] spotted fever: Pending --Continue to monitor fever curve, afebrile since admission  Transaminitis Unclear etiology, acute hepatitis panel negative.  Improving. --AST 70>53 --ALT 90>78 --Tbili 0.1>0.4 --LFTs daily  Hypokalemia Potassium 3.4, will replete. --Repeat electrolytes in a.m. to include magnesium  Essential hypertension --Metoprolol succinate 100 mg p.o. daily --Amlodipine 10 mg p.o. daily --Irbesartan 300 mg p.o. daily --Hydralazine 50 mg p.o. 3 times daily  Hyperlipidemia --Zetia 10 mg p.o. daily  CAD s/p PCI/stent --Aspirin 81 mg p.o. daily, Plavix 75 mg p.o. daily  Anxiety Schizoaffective disorder --Alprazolam 1 mg p.o. 3 times daily --Prescribed Depakote outpatient, but  apparently has not taken for 1 month; will hold for now and defer to outpatient  Tobacco use disorder Counseled on need for complete cessation --Nicotine patch  Morbid obesity Body mass index is 36.1 kg/m. Discussed with  patient needs for aggressive lifestyle changes/weight loss as this complicates all facets of care.  Outpatient follow-up with PCP.      DVT prophylaxis: heparin injection 5,000 Units Start: 05/29/21 0600 SCDs Start: 05/28/21 2317    Code Status: Full Code Family Communication:   Disposition Plan:  Level of care: Med-Surg Status is: Observation The patient remains OBS appropriate and will d/c before 2 midnights.    Consultants:  Infectious disease, Dr. Drucilla Schmidt  Procedures:  TTE: Pending  Antimicrobials:  None   Subjective: Patient seen examined bedside, resting comfortably.  Lying in bed.  Echo tech present.  Remains afebrile since hospitalization.  Patient reporting areas of hardness from recent tick bites to the posterior right knee, anterior right thigh.  No other specific complaints or concerns at this time.  Discussed undergoing further lab work-up to further delineate etiology of his recurrent fevers.  Denies headache, no visual changes, no chest pain, no palpitations, no current fever, no chills/night sweats, no nausea/vomiting/diarrhea, no abdominal pain, no focal weakness, no fatigue, no paresthesias.  No acute events overnight per nurse staff.  Objective: Vitals:   05/29/21 0849 05/29/21 0900 05/29/21 1038 05/29/21 1205  BP: (!) 149/88 (!) 150/88  (!) 160/83  Pulse: 79 80  74  Resp: _0 Temp: 98.3 F (36.8 C) 98 F (36.7 C)  98.3 F (36.8 C)  TempSrc: Oral Oral  Oral  SpO2: 96% 99%  100%  Weight:   110.9 kg   Height:        Intake/Output Summary (Last 24 hours) at 05/29/2021 1226 Last data filed at 05/29/2021 0800 Gross per 24 hour  Intake 120 ml  Output --  Net 120 ml   Filed Weights   05/28/21 1620 05/29/21 0452 05/29/21 1038  Weight: 113.6 kg 112.9 kg 110.9 kg    Examination:  Physical Exam: GEN: NAD, alert and oriented x 3, obese HEENT: NCAT, PERRL, EOMI, sclera clear, MMM dentition PULM: CTAB w/o wheezes/crackles, normal respiratory  effort, on room air CV: RRR w/o M/G/R GI: abd soft, NTND, NABS, no R/G/M MSK: no peripheral edema, muscle strength globally intact 5/5 bilateral upper/lower extremities NEURO: CN II-XII intact, no focal deficits, sensation to light touch intact PSYCH: normal mood/affect Integumentary: Right posterior knee with slight induration without erythema at site of previous tick bite, induration right anterior thigh without erythema previous tick bite, small cervical posterior nodule appreciated, nontender to palpation without underlying fluctuance/erythema    Data Reviewed: I have personally reviewed following labs and imaging studies  CBC: Recent Labs  Lab 05/28/21 1643 05/29/21 0616  WBC 9.7 8.4  NEUTROABS  --  4.3  HGB 15.8 15.1  HCT 44.9 42.9  MCV 90.9 90.7  PLT 225 599   Basic Metabolic Panel: Recent Labs  Lab 05/28/21 1643 05/29/21 0616  NA 139 138  K 3.8 3.4*  CL 103 103  CO2 27 25  GLUCOSE 81 103*  BUN 12 13  CREATININE 1.02 1.05  CALCIUM 9.0 8.6*   GFR: Estimated Creatinine Clearance: 104.5 mL/min (by C-G formula based on SCr of 1.05 mg/dL). Liver Function Tests: Recent Labs  Lab 05/28/21 1643 05/29/21 0616  AST 70* 53*  ALT 90* 78*  ALKPHOS 79 75  BILITOT 0.1* 0.4  PROT 6.8 6.6  ALBUMIN 3.6 3.5   No results for input(s): LIPASE, AMYLASE in the last 168 hours. No results for input(s): AMMONIA in the last 168 hours. Coagulation Profile: No results for input(s): INR, PROTIME in the last 168 hours. Cardiac Enzymes: Recent Labs  Lab 05/28/21 2134  CKTOTAL 95   BNP (last 3 results) No results for input(s): PROBNP in the last 8760 hours. HbA1C: No results for input(s): HGBA1C in the last 72 hours. CBG: No results for input(s): GLUCAP in the last 168 hours. Lipid Profile: No results for input(s): CHOL, HDL, LDLCALC, TRIG, CHOLHDL, LDLDIRECT in the last 72 hours. Thyroid Function Tests: No results for input(s): TSH, T4TOTAL, FREET4, T3FREE, THYROIDAB in  the last 72 hours. Anemia Panel: No results for input(s): VITAMINB12, FOLATE, FERRITIN, TIBC, IRON, RETICCTPCT in the last 72 hours. Sepsis Labs: Recent Labs  Lab 05/28/21 1900 05/28/21 2134 05/29/21 0616  LATICACIDVEN 2.2* 1.1 1.4    Recent Results (from the past 240 hour(s))  Culture, blood (Routine X 2) w Reflex to ID Panel     Status: None (Preliminary result)   Collection Time: 05/28/21  6:46 PM   Specimen: BLOOD  Result Value Ref Range Status   Specimen Description BLOOD SITE NOT SPECIFIED  Final   Special Requests   Final    BOTTLES DRAWN AEROBIC AND ANAEROBIC Blood Culture results may not be optimal due to an inadequate volume of blood received in culture bottles   Culture   Final    NO GROWTH < 12 HOURS Performed at [facility], [place] [address], [place], [place] [address]    Report Status PENDING  Incomplete  Culture, blood (Routine X 2) w Reflex to ID Panel     Status: None (Preliminary result)   Collection Time: 05/28/21  9:35 PM   Specimen: BLOOD  Result Value Ref Range Status   Specimen Description BLOOD RIGHT ANTECUBITAL  Final   Special Requests   Final    BOTTLES DRAWN AEROBIC AND ANAEROBIC Blood Culture adequate volume   Culture   Final    NO GROWTH < 12 HOURS Performed at [facility], [place] [address], [place], [place] [address]    Report Status PENDING  Incomplete  SARS Coronavirus 2 by RT PCR (hospital order, performed in [facility] hospital lab) *cepheid single result test* Anterior Nasal Swab     Status: None   Collection Time: 05/28/21 10:24 PM   Specimen: Anterior Nasal Swab  Result Value Ref Range Status   SARS Coronavirus 2 by RT PCR NEGATIVE NEGATIVE Final    Comment: Performed at [facility], [place] [address], [place], [place] [address]         Radiology Studies: CT CHEST ABDOMEN PELVIS W CONTRAST  Result Date: 05/29/2021 CLINICAL DATA:  Fever of unknown origin. EXAM: CT CHEST, ABDOMEN, AND PELVIS WITH CONTRAST  TECHNIQUE: Multidetector CT imaging of the chest, abdomen and pelvis was performed following the standard protocol during bolus administration of intravenous contrast. RADIATION DOSE REDUCTION: This exam was performed according to the departmental dose-optimization program which includes automated exposure control, adjustment of the mA and/or kV according to patient size and/or use of iterative reconstruction technique. CONTRAST:  166mL OMNIPAQUE IOHEXOL 300 MG/ML  SOLN COMPARISON:  CT abdomen and pelvis 03/18/2018. FINDINGS: CT CHEST FINDINGS Cardiovascular: No significant vascular findings. Normal heart size. No pericardial effusion. Mediastinum/Nodes: No enlarged mediastinal, hilar, or axillary lymph nodes. Thyroid gland, trachea, and esophagus demonstrate no  significant findings. Lungs/Pleura: There are minimal peripheral ground-glass opacities throughout the right lung. There is no focal lung infiltrate, pleural effusion or pneumothorax. Musculoskeletal: No fracture. There is a rounded subcutaneous fluid attenuation area in the right posterior soft tissues of the upper back which may represent a sebaceous cyst. CT ABDOMEN PELVIS FINDINGS Hepatobiliary: No focal liver abnormality is seen. No gallstones, gallbladder wall thickening, or biliary dilatation. Pancreas: Unremarkable. No pancreatic ductal dilatation or surrounding inflammatory changes. Spleen: Normal in size without focal abnormality. Adrenals/Urinary Tract: Adrenal glands are unremarkable. Kidneys are normal, without renal calculi, focal lesion, or hydronephrosis. Bladder is unremarkable. Stomach/Bowel: Stomach is within normal limits. Appendix appears normal. No evidence of bowel wall thickening, distention, or inflammatory changes.There is sigmoid colon diverticulosis without evidence for acute diverticulitis. Vascular/Lymphatic: Aortic atherosclerosis. No enlarged abdominal or pelvic lymph nodes. Reproductive: Prostate gland is within normal  limits. Other: Small fat containing left inguinal hernia.  No ascites. Musculoskeletal: No acute or significant osseous findings. IMPRESSION: 1. Minimal peripheral ground-glass opacities in the right lung may represent atelectasis. No definite focal lung infiltrate. 2. No acute localizing process in the abdomen or pelvis. 3. Sigmoid colon diverticulosis. 4. Stable fat containing left inguinal hernia. 5.  Aortic Atherosclerosis (ICD10-I70.0). 6. Likely sebaceous cyst in the posterior soft tissues of the upper right chest. Correlate clinically. Electronically Signed   By: Ronney Asters M.D.   On: 05/29/2021 02:18   ECHOCARDIOGRAM COMPLETE  Result Date: 05/29/2021    ECHOCARDIOGRAM REPORT   Patient Name:   Cameron Bryan Date of Exam: 05/29/2021 Medical Rec #:  [id number]    Height:       69.0 in Accession #:    [id number]   Weight:       248.9 lb Date of Birth:  07/22/1970   BSA:          2.267 m Patient Age:    3 years     BP:           149/88 mmHg Patient Gender: M            HR:           72 bpm. Exam Location:  Inpatient Procedure: 2D Echo, Cardiac Doppler and Color Doppler Indications:    Fever  History:        Patient has no prior history of Echocardiogram examinations.                 CAD; Risk Factors:Hypertension, Dyslipidemia and Current Smoker.  Sonographer:    Clayton Lefort RDCS (AE) Referring Phys: [id number] Leonides Minder CHEN  Sonographer Comments: Suboptimal subcostal window and patient is morbidly obese. IMPRESSIONS  1. No obvious vegetations but valves and atrial septum poorly visualized.  2. Left ventricular ejection fraction, by estimation, is 60 to 65%. The left ventricle has normal function. The left ventricle has no regional wall motion abnormalities. There is mild left ventricular hypertrophy. Left ventricular diastolic parameters were normal.  3. Right ventricular systolic function is normal. The right ventricular size is normal.  4. Left atrial size was mildly dilated.  5. The mitral valve is abnormal. Mild  mitral valve regurgitation. No evidence of mitral stenosis.  6. The aortic valve is tricuspid. There is mild calcification of the aortic valve. Aortic valve regurgitation is not visualized. Aortic valve sclerosis is present, with no evidence of aortic valve stenosis.  7. The inferior vena cava is normal in size with greater than 50% respiratory variability, suggesting right atrial pressure of 3  mmHg. FINDINGS  Left Ventricle: Left ventricular ejection fraction, by estimation, is 60 to 65%. The left ventricle has normal function. The left ventricle has no regional wall motion abnormalities. The left ventricular internal cavity size was normal in size. There is  mild left ventricular hypertrophy. Left ventricular diastolic parameters were normal. Right Ventricle: The right ventricular size is normal. No increase in right ventricular wall thickness. Right ventricular systolic function is normal. Left Atrium: Left atrial size was mildly dilated. Right Atrium: Right atrial size was normal in size. Pericardium: There is no evidence of pericardial effusion. Mitral Valve: The mitral valve is abnormal. There is mild thickening of the mitral valve leaflet(s). There is mild calcification of the mitral valve leaflet(s). Mild mitral valve regurgitation. No evidence of mitral valve stenosis. Tricuspid Valve: The tricuspid valve is normal in structure. Tricuspid valve regurgitation is not demonstrated. No evidence of tricuspid stenosis. Aortic Valve: The aortic valve is tricuspid. There is mild calcification of the aortic valve. Aortic valve regurgitation is not visualized. Aortic valve sclerosis is present, with no evidence of aortic valve stenosis. Aortic valve mean gradient measures 7.0 mmHg. Aortic valve peak gradient measures 13.7 mmHg. Aortic valve area, by VTI measures 3.40 cm. Pulmonic Valve: The pulmonic valve was normal in structure. Pulmonic valve regurgitation is not visualized. No evidence of pulmonic stenosis.  Aorta: The aortic root is normal in size and structure. Venous: The inferior vena cava is normal in size with greater than 50% respiratory variability, suggesting right atrial pressure of 3 mmHg. IAS/Shunts: The interatrial septum was not well visualized. Additional Comments: No obvious vegetations but valves and atrial septum poorly visualized.  LEFT VENTRICLE PLAX 2D LVIDd:         5.00 cm   Diastology LVIDs:         2.90 cm   LV e' medial:    6.64 cm/s LV PW:         1.40 cm   LV E/e' medial:  11.9 LV IVS:        1.20 cm   LV e' lateral:   8.16 cm/s LVOT diam:     2.20 cm   LV E/e' lateral: 9.7 LV SV:         124 LV SV Index:   54 LVOT Area:     3.80 cm  RIGHT VENTRICLE RV Basal diam:  2.50 cm RV S prime:     12.00 cm/s TAPSE (M-mode): 2.3 cm LEFT ATRIUM           Index        RIGHT ATRIUM           Index LA diam:      4.20 cm 1.85 cm/m   RA Area:     12.50 cm LA Vol (A4C): 84.5 ml 37.27 ml/m  RA Volume:   25.00 ml  11.03 ml/m  AORTIC VALVE AV Area (Vmax):    3.31 cm AV Area (Vmean):   3.24 cm AV Area (VTI):     3.40 cm AV Vmax:           185.00 cm/s AV Vmean:          122.000 cm/s AV VTI:            0.363 m AV Peak Grad:      13.7 mmHg AV Mean Grad:      7.0 mmHg LVOT Vmax:         161.00 cm/s LVOT Vmean:  104.000 cm/s LVOT VTI:          0.325 m LVOT/AV VTI ratio: 0.90  AORTA Ao Root diam: 3.10 cm Ao Asc diam:  3.10 cm MITRAL VALVE MV Area (PHT): 3.60 cm    SHUNTS MV Decel Time: 211 msec    Systemic VTI:  0.32 m MV E velocity: 79.20 cm/s  Systemic Diam: 2.20 cm MV A velocity: 90.50 cm/s MV E/A ratio:  0.88 Jenkins Rouge MD Electronically signed by Jenkins Rouge MD Signature Date/Time: 05/29/2021/11:10:35 AM    Final         Scheduled Meds:  ALPRAZolam  1 mg Oral TID   amLODipine  10 mg Oral QHS   aspirin EC  81 mg Oral Daily   clopidogrel  75 mg Oral Q breakfast   ezetimibe  10 mg Oral QPM   heparin  5,000 Units Subcutaneous Q8H   hydrALAZINE  50 mg Oral TID   irbesartan  300 mg Oral  Daily   metoprolol succinate  100 mg Oral Daily   pantoprazole  40 mg Oral QHS   Continuous Infusions:   LOS: 0 days    Time spent: 46 minutes spent on chart review, discussion with nursing staff, consultants, updating family and interview/physical exam; more than 50% of that time was spent in counseling and/or coordination of care.    Matea Stanard J [place] DO [facility] Available via Epic secure chat 7am-7pm After these hours, please refer to coverage provider listed on [email or web address] 05/29/2021, 12:26 PM

## 2021-05-29 NOTE — Progress Notes (Signed)
  Echocardiogram 2D Echocardiogram has been performed.  Gerda Diss 05/29/2021, 9:30 AM

## 2021-05-30 DIAGNOSIS — K089 Disorder of teeth and supporting structures, unspecified: Secondary | ICD-10-CM | POA: Diagnosis not present

## 2021-05-30 DIAGNOSIS — F17210 Nicotine dependence, cigarettes, uncomplicated: Secondary | ICD-10-CM | POA: Diagnosis not present

## 2021-05-30 DIAGNOSIS — G8929 Other chronic pain: Secondary | ICD-10-CM

## 2021-05-30 DIAGNOSIS — R509 Fever, unspecified: Secondary | ICD-10-CM | POA: Diagnosis not present

## 2021-05-30 DIAGNOSIS — I10 Essential (primary) hypertension: Secondary | ICD-10-CM | POA: Diagnosis not present

## 2021-05-30 LAB — COMPREHENSIVE METABOLIC PANEL
ALT: 73 U/L — ABNORMAL HIGH (ref 0–44)
AST: 52 U/L — ABNORMAL HIGH (ref 15–41)
Albumin: 3.4 g/dL — ABNORMAL LOW (ref 3.5–5.0)
Alkaline Phosphatase: 71 U/L (ref 38–126)
Anion gap: 8 (ref 5–15)
BUN: 16 mg/dL (ref 6–20)
CO2: 28 mmol/L (ref 22–32)
Calcium: 8.9 mg/dL (ref 8.9–10.3)
Chloride: 106 mmol/L (ref 98–111)
Creatinine, Ser: 1.06 mg/dL (ref 0.61–1.24)
GFR, Estimated: >60 mL/min (ref >60)
Glucose, Bld: 114 mg/dL — ABNORMAL HIGH (ref 70–99)
Potassium: 3.2 mmol/L — ABNORMAL LOW (ref 3.5–5.1)
Sodium: 142 mmol/L (ref 135–145)
Total Bilirubin: 0.4 mg/dL (ref 0.3–1.2)
Total Protein: 6.4 g/dL — ABNORMAL LOW (ref 6.5–8.1)

## 2021-05-30 LAB — CBC
HCT: 41 % (ref 39.0–52.0)
Hemoglobin: 14.3 g/dL (ref 13.0–17.0)
MCH: 31.7 pg (ref 26.0–34.0)
MCHC: 34.9 g/dL (ref 30.0–36.0)
MCV: 90.9 fL (ref 80.0–100.0)
Platelets: 179 10*3/uL (ref 150–400)
RBC: 4.51 MIL/uL (ref 4.22–5.81)
RDW: 12.8 % (ref 11.5–15.5)
WBC: 8.1 10*3/uL (ref 4.0–10.5)
nRBC: 0 % (ref 0.0–0.2)

## 2021-05-30 LAB — MAGNESIUM: Magnesium: 2.1 mg/dL (ref 1.7–2.4)

## 2021-05-30 MED ORDER — POTASSIUM CHLORIDE CRYS ER 20 MEQ PO TBCR
40.0000 meq | EXTENDED_RELEASE_TABLET | ORAL | Status: AC
  Administered 2021-05-30 (×2): 40 meq via ORAL
  Filled 2021-05-30 (×2): qty 2

## 2021-05-30 NOTE — Progress Notes (Signed)
Subjective: No new complaints   Antibiotics:  Anti-infectives (From admission, onward)    None       Medications: Scheduled Meds:  ALPRAZolam  1 mg Oral TID   amLODipine  10 mg Oral QHS   aspirin EC  81 mg Oral Daily   clopidogrel  75 mg Oral Q breakfast   ezetimibe  10 mg Oral QPM   heparin  5,000 Units Subcutaneous Q8H   hydrALAZINE  50 mg Oral TID   irbesartan  300 mg Oral Daily   metoprolol succinate  100 mg Oral Daily   pantoprazole  40 mg Oral QHS   potassium chloride  40 mEq Oral Q3H   Continuous Infusions: PRN Meds:.labetalol, ondansetron **OR** ondansetron (ZOFRAN) IV    Objective: Weight change: -2.7 kg  Intake/Output Summary (Last 24 hours) at 05/30/2021 1120 Last data filed at 05/30/2021 0830 Gross per 24 hour  Intake 180 ml  Output --  Net 180 ml   Blood pressure (!) 181/101, pulse 73, temperature 97.8 F (36.6 C), temperature source Oral, resp. rate 18, height 5\' 9"  (1.753 m), weight 110.9 kg, SpO2 97 %. Temp:  [97.8 F (36.6 C)-98.7 F (37.1 C)] 97.8 F (36.6 C) (05/28 0809) Pulse Rate:  [62-74] 73 (05/28 0809) Resp:  [18-20] 18 (05/28 0809) BP: (135-181)/(76-106) 181/101 (05/28 0809) SpO2:  [97 %-100 %] 97 % (05/28 0809)  Physical Exam: Physical Exam Constitutional:      Appearance: He is well-developed.  HENT:     Head: Normocephalic and atraumatic.  Eyes:     Conjunctiva/sclera: Conjunctivae normal.  Cardiovascular:     Rate and Rhythm: Normal rate and regular rhythm.  Pulmonary:     Effort: Pulmonary effort is normal. No respiratory distress.     Breath sounds: Normal breath sounds. No stridor. No wheezing.  Abdominal:     General: There is no distension.     Palpations: Abdomen is soft.  Musculoskeletal:        General: Normal range of motion.     Cervical back: Normal range of motion and neck supple.  Skin:    General: Skin is warm and dry.     Findings: No erythema or rash.  Neurological:     General: No  focal deficit present.     Mental Status: He is alert and oriented to person, place, and time.  Psychiatric:        Mood and Affect: Mood normal.        Behavior: Behavior normal.        Thought Content: Thought content normal.        Judgment: Judgment normal.     CBC:    BMET Recent Labs    05/29/21 0616 05/30/21 0026  NA 138 142  K 3.4* 3.2*  CL 103 106  CO2 25 28  GLUCOSE 103* 114*  BUN 13 16  CREATININE 1.05 1.06  CALCIUM 8.6* 8.9     Liver Panel  Recent Labs    05/29/21 0616 05/30/21 0026  PROT 6.6 6.4*  ALBUMIN 3.5 3.4*  AST 53* 52*  ALT 78* 73*  ALKPHOS 75 71  BILITOT 0.4 0.4       Sedimentation Rate Recent Labs    05/29/21 1013  ESRSEDRATE 12   C-Reactive Protein Recent Labs    05/28/21 2134 05/29/21 1013  CRP 1.6* 1.2*    Micro Results: Recent Results (from the past 720 hour(s))  Culture, blood (Routine X  2) w Reflex to ID Panel     Status: None (Preliminary result)   Collection Time: 05/28/21  6:46 PM   Specimen: BLOOD  Result Value Ref Range Status   Specimen Description BLOOD SITE NOT SPECIFIED  Final   Special Requests   Final    BOTTLES DRAWN AEROBIC AND ANAEROBIC Blood Culture results may not be optimal due to an inadequate volume of blood received in culture bottles   Culture   Final    NO GROWTH < 12 HOURS Performed at [facility], [address] [place], [place] [address]    Report Status PENDING  Incomplete  Culture, blood (Routine X 2) w Reflex to ID Panel     Status: None (Preliminary result)   Collection Time: 05/28/21  9:35 PM   Specimen: BLOOD  Result Value Ref Range Status   Specimen Description BLOOD RIGHT ANTECUBITAL  Final   Special Requests   Final    BOTTLES DRAWN AEROBIC AND ANAEROBIC Blood Culture adequate volume   Culture   Final    NO GROWTH < 12 HOURS Performed at [facility], [address], [place], [place] [address]    Report Status PENDING  Incomplete  SARS Coronavirus 2 by RT  PCR (hospital order, performed in [facility] hospital lab) *cepheid single result test* Anterior Nasal Swab     Status: None   Collection Time: 05/28/21 10:24 PM   Specimen: Anterior Nasal Swab  Result Value Ref Range Status   SARS Coronavirus 2 by RT PCR NEGATIVE NEGATIVE Final    Comment: Performed at [facility], [address] [place], [place] [address]    Studies/Results: [place] [facility]  Result Date: 05/29/2021 CLINICAL DATA:  Dental pain. EXAM: ORTHOPANTOGRAM/PANORAMIC COMPARISON:  None Available. FINDINGS: No fracture or bone lesion. Previous fillings involving bilateral molars. No un treated dental carie. No periapical lucency to suggest a root abscess. Soft tissues are unremarkable. IMPRESSION: Negative. Electronically Signed   By: Amie Portland M.D.   On: 05/29/2021 15:07   CT CHEST ABDOMEN PELVIS W CONTRAST  Result Date: 05/29/2021 CLINICAL DATA:  Fever of unknown origin. EXAM: CT CHEST, ABDOMEN, AND PELVIS WITH CONTRAST TECHNIQUE: Multidetector CT imaging of the chest, abdomen and pelvis was performed following the standard protocol during bolus administration of intravenous contrast. RADIATION DOSE REDUCTION: This exam was performed according to the departmental dose-optimization program which includes automated exposure control, adjustment of the mA and/or kV according to patient size and/or use of iterative reconstruction technique. CONTRAST:  OMNIPAQUE IOHEXOL 300 MG/ML  SOLN COMPARISON:  CT abdomen and pelvis 03/18/2018. FINDINGS: CT CHEST FINDINGS Cardiovascular: No significant vascular findings. Normal heart size. No pericardial effusion. Mediastinum/Nodes: No enlarged mediastinal, hilar, or axillary lymph nodes. Thyroid gland, trachea, and esophagus demonstrate no significant findings. Lungs/Pleura: There are minimal peripheral ground-glass opacities throughout the right lung. There is no focal lung infiltrate, pleural effusion or pneumothorax. Musculoskeletal: No  fracture. There is a rounded subcutaneous fluid attenuation area in the right posterior soft tissues of the upper back which may represent a sebaceous cyst. CT ABDOMEN PELVIS FINDINGS Hepatobiliary: No focal liver abnormality is seen. No gallstones, gallbladder wall thickening, or biliary dilatation. Pancreas: Unremarkable. No pancreatic ductal dilatation or surrounding inflammatory changes. Spleen: Normal in size without focal abnormality. Adrenals/Urinary Tract: Adrenal glands are unremarkable. Kidneys are normal, without renal calculi, focal lesion, or hydronephrosis. Bladder is unremarkable. Stomach/Bowel: Stomach is within normal limits. Appendix appears normal. No evidence of bowel  wall thickening, distention, or inflammatory changes.There is sigmoid colon diverticulosis without evidence for acute diverticulitis. Vascular/Lymphatic: Aortic atherosclerosis. No enlarged abdominal or pelvic lymph nodes. Reproductive: Prostate gland is within normal limits. Other: Small fat containing left inguinal hernia.  No ascites. Musculoskeletal: No acute or significant osseous findings. IMPRESSION: 1. Minimal peripheral ground-glass opacities in the right lung may represent atelectasis. No definite focal lung infiltrate. 2. No acute localizing process in the abdomen or pelvis. 3. Sigmoid colon diverticulosis. 4. Stable fat containing left inguinal hernia. 5.  Aortic Atherosclerosis (ICD10-I70.0). 6. Likely sebaceous cyst in the posterior soft tissues of the upper right chest. Correlate clinically. Electronically Signed   By: Darliss Cheney M.D.   On: 05/29/2021 02:18   DG Knee Complete 4 Views Right  Result Date: 05/29/2021 CLINICAL DATA:  Pain. EXAM: RIGHT KNEE - COMPLETE 4+ VIEW COMPARISON:  None Available. FINDINGS: Mild chronic spurring at the medial collateral ligament origin off of the superomedial femoral condyle. Minimal medial compartment joint space narrowing. Enthesopathic change is mild at the quadriceps and  minimal patellar tendon insertions on the patella. No knee joint effusion. No acute fracture or dislocation. IMPRESSION: 1. Mild medial compartment osteoarthritis. 2. Chronic enthesopathic change at the proximal medial collateral ligament origin and the insertions of the quadriceps and patellar tendons on the patella. Electronically Signed   By: Neita Garnet M.D.   On: 05/29/2021 16:04   ECHOCARDIOGRAM COMPLETE  Result Date: 05/29/2021    ECHOCARDIOGRAM REPORT   Patient Name:   KAIREE ISA Date of Exam: 05/29/2021 Medical Rec #:  [id number]    Height:       69.0 in Accession #:    [id number]   Weight:       248.9 lb Date of Birth:  Jun 08, 1970   BSA:          2.267 m Patient Age:    50 years     BP:           149/88 mmHg Patient Gender: M            HR:           72 bpm. Exam Location:  Inpatient Procedure: 2D Echo, Cardiac Doppler and Color Doppler Indications:    Fever  History:        Patient has no prior history of Echocardiogram examinations.                 CAD; Risk Factors:Hypertension, Dyslipidemia and Current Smoker.  Sonographer:    Ross Ludwig RDCS (AE) Referring Phys: [id number] ERIC CHEN  Sonographer Comments: Suboptimal subcostal window and patient is morbidly obese. IMPRESSIONS  1. No obvious vegetations but valves and atrial septum poorly visualized.  2. Left ventricular ejection fraction, by estimation, is 60 to 65%. The left ventricle has normal function. The left ventricle has no regional wall motion abnormalities. There is mild left ventricular hypertrophy. Left ventricular diastolic parameters were normal.  3. Right ventricular systolic function is normal. The right ventricular size is normal.  4. Left atrial size was mildly dilated.  5. The mitral valve is abnormal. Mild mitral valve regurgitation. No evidence of mitral stenosis.  6. The aortic valve is tricuspid. There is mild calcification of the aortic valve. Aortic valve regurgitation is not visualized. Aortic valve sclerosis is present, with  no evidence of aortic valve stenosis.  7. The inferior vena cava is normal in size with greater than 50% respiratory variability, suggesting right atrial pressure of 3 mmHg. FINDINGS  Left Ventricle: Left ventricular ejection fraction, by estimation, is 60 to 65%. The left ventricle has normal function. The left ventricle has no regional wall motion abnormalities. The left ventricular internal cavity size was normal in size. There is  mild left ventricular hypertrophy. Left ventricular diastolic parameters were normal. Right Ventricle: The right ventricular size is normal. No increase in right ventricular wall thickness. Right ventricular systolic function is normal. Left Atrium: Left atrial size was mildly dilated. Right Atrium: Right atrial size was normal in size. Pericardium: There is no evidence of pericardial effusion. Mitral Valve: The mitral valve is abnormal. There is mild thickening of the mitral valve leaflet(s). There is mild calcification of the mitral valve leaflet(s). Mild mitral valve regurgitation. No evidence of mitral valve stenosis. Tricuspid Valve: The tricuspid valve is normal in structure. Tricuspid valve regurgitation is not demonstrated. No evidence of tricuspid stenosis. Aortic Valve: The aortic valve is tricuspid. There is mild calcification of the aortic valve. Aortic valve regurgitation is not visualized. Aortic valve sclerosis is present, with no evidence of aortic valve stenosis. Aortic valve mean gradient measures 7.0 mmHg. Aortic valve peak gradient measures 13.7 mmHg. Aortic valve area, by VTI measures 3.40 cm. Pulmonic Valve: The pulmonic valve was normal in structure. Pulmonic valve regurgitation is not visualized. No evidence of pulmonic stenosis. Aorta: The aortic root is normal in size and structure. Venous: The inferior vena cava is normal in size with greater than 50% respiratory variability, suggesting right atrial pressure of 3 mmHg. IAS/Shunts: The interatrial septum was  not well visualized. Additional Comments: No obvious vegetations but valves and atrial septum poorly visualized.  LEFT VENTRICLE PLAX 2D LVIDd:         5.00 cm   Diastology LVIDs:         2.90 cm   LV e' medial:    6.64 cm/s LV PW:         1.40 cm   LV E/e' medial:  11.9 LV IVS:        1.20 cm   LV e' lateral:   8.16 cm/s LVOT diam:     2.20 cm   LV E/e' lateral: 9.7 LV SV:         124 LV SV Index:   54 LVOT Area:     3.80 cm  RIGHT VENTRICLE RV Basal diam:  2.50 cm RV S prime:     12.00 cm/s TAPSE (M-mode): 2.3 cm LEFT ATRIUM           Index        RIGHT ATRIUM           Index LA diam:      4.20 cm 1.85 cm/m   RA Area:     12.50 cm LA Vol (A4C): 84.5 ml 37.27 ml/m  RA Volume:   25.00 ml  11.03 ml/m  AORTIC VALVE AV Area (Vmax):    3.31 cm AV Area (Vmean):   3.24 cm AV Area (VTI):     3.40 cm AV Vmax:           185.00 cm/s AV Vmean:          122.000 cm/s AV VTI:            0.363 m AV Peak Grad:      13.7 mmHg AV Mean Grad:      7.0 mmHg LVOT Vmax:         161.00 cm/s LVOT Vmean:  104.000 cm/s LVOT VTI:          0.325 m LVOT/AV VTI ratio: 0.90  AORTA Ao Root diam: 3.10 cm Ao Asc diam:  3.10 cm MITRAL VALVE MV Area (PHT): 3.60 cm    SHUNTS MV Decel Time: 211 msec    Systemic VTI:  0.32 m MV E velocity: 79.20 cm/s  Systemic Diam: 2.20 cm MV A velocity: 90.50 cm/s MV E/A ratio:  0.88 Charlton Haws MD Electronically signed by Charlton Haws MD Signature Date/Time: 05/29/2021/11:10:35 AM    Final       Assessment/Plan:  INTERVAL HISTORY: No further fevers   Principal Problem:   FUO (fever of unknown origin) Active Problems:   Schizoaffective disorder (HCC)   Benign essential HTN   Cigarette smoker   Mixed dyslipidemia   COPD (chronic obstructive pulmonary disease) (HCC)   GERD (gastroesophageal reflux disease)   Obesity (BMI 30-39.9)   Coronary artery disease   Posterior cervical adenopathy   Elevated LFTs   Chronic dental pain   Recurrent fever   Left knee pain   Tick bites    Fever    Cameron Bryan is a 51 y.o. male with history of recurrent monthly fevers who had bitten by ticks and was not getting better with doxycycline then given Keflex for potential cellulitis along with further doxycycline.  He was admitted to [facility] for work-up for FUO.  Labs here including CT chest abdomen and pelvis have been unremarkable.  He has been afebrile while in the hospital  He is feeling much better and eager to go home.  I suspect he might have cyclic neutropenia which might explain his monthly episodes of unexplained fevers that are fairly high and but then resolved within a few days.  I have scheduled for follow-up with me in the clinic.  He is being discharged today.     LOS: 0 days   Acey Lav 05/30/2021, 11:20 AM

## 2021-05-30 NOTE — Discharge Summary (Signed)
Physician Discharge Summary  Cameron Bryan OEU:[id number] DOB: 1970-01-09 DOA: 05/28/2021  PCP: Ronita Hipps, MD  Admit date: 05/28/2021 Discharge date: 05/30/2021  Admitted From: Home Disposition: Home  Recommendations for Outpatient Follow-up:  Follow up with PCP in 1-2 weeks Follow-up with infectious disease, Dr. Drucilla Schmidt Please follow up on the following pending results: Angiotensin-converting enzyme, CMV antibody IgG, Epstein-Barr virus, cryoglobulin, ANCA profile, SPEP, rheumatoid factor, ANA, QuantiFERON gold, [place] spotted fever, Lyme's disease titers, all of which were pending at time of discharge for work-up of fever of unknown origin. Follow-up finalized blood cultures, showed no growth during hospitalization Recommend CMP in 1 week to ensure her LFTs normalized  Home Health: No Equipment/Devices: None  Discharge Condition: Stable CODE STATUS: Full code Diet recommendation: Heart healthy diet  History of present illness:  Cameron Bryan is a 51 y.o. male with past medical history significant for HTN, schizoaffective disorder, CAD s/p PCI/stent, HLD, chronic tobacco abuse, obesity who presents to [facility] ED on 5/26 with persistent fevers over the last 3 weeks.  Patient reports he was recently performing tree work and subsequently had multiple ticks over his body including posterior to his right knee, anterior right thigh, and neck that he noted on May 04, 2021 and subsequently remove them.  Patient reports onset of fevers May 06, 2021 and development of a rash to his right lower extremity.  He started taking some old doxycycline from a previous prescription and was seen by his primary care doctor with concerns of cellulitis and was started on oral Keflex.  He then subsequently went to the ER at [facility] and was started on IV doxycycline and transition to p.o. doxycycline for concern of tickborne illness and has been on doxycycline over the last 3 weeks.  Patient denies any  improvement of his fever, with temperature up to 103 F daily.    Patient also reports he has had intermittent fevers every month for the last 2 years that usually gets better with Tylenol, has never had this worked up previously.  Denies IV drug abuse, no history of cancer or lymphoma.   In the ED, temperature 98.0 F, HR 68, RR 18, BP 164/96, SPO2 96% on room air.  WBC count 9.7, hemoglobin 15.8, platelets 225.  Sodium 139, potassium 3.8, chloride 103, CO2 27, BUN 12, creatinine 1.02, glucose 81, AST 70, ALT 90, total bilirubin 0.1.  CRP 1.6, ESR 20.  Lactic acid 2.2.  Uric acid 5.0.  Acute hepatitis panel negative.  HIV nonreactive.  Urinalysis unrevealing.  [facility] spotted fever antibodies and Lyme disease titers, blood cultures x2 collected.  EDP discussed case with infectious disease, Dr. Drucilla Schmidt who recommended patient admitted to the hospital service for further evaluation.  Hospitalist service consulted for admission for fever of unknown origin.  Hospital course:  Fever of unknown origin Patient presenting with persistent fever over the last 3 weeks in the setting of multiple tick bites with associated reported rash.  Patient has been on doxycycline for 3 weeks and a course of Keflex for also concern of cellulitis.  Despite treatment patient reports continued intermittent fevers up to 103.0 F with poor response to Tylenol.  Although, patient also reports that he has had intermittent fevers; although less over the past 2 years that has never been worked up further.  Acute hepatitis panel, HIV nonreactive.  Infectious disease was consulted and followed during hospital course.  Right knee x-ray with medial compartment osteoarthritis, otherwise unrevealing.  Panorex negative  for abscess or concerning dental caries.  TTE with LVEF 60 to 65%, LV normal function, LV no regional wall motion normalities, mild LVH, LA mildly dilated, mild MR, no evidence of aortic stenosis, IVC normal in size; no obvious  vegetations reported.  Patient remained afebrile during hospitalization.  Per ID okay for discharge home and outpatient follow-up regarding remaining test results to include Cryoglobin, ANCA profile, SPEP, rheumatoid factor, ANA, QuantiFERON gold, Lyme disease, [place] spotted fever.  Blood cultures showed no growth during hospitalization.   Transaminitis Unclear etiology, acute hepatitis panel negative.  Improving.  Recommend repeat CMP 1 week   Hypokalemia Repleted during hospitalization.   Essential hypertension Continue home metoprolol succinate 100 mg p.o. daily, Amlodipine 10 mg p.o. daily, Irbesartan 300 mg p.o. daily, Hydralazine 50 mg p.o. 3 times daily   Hyperlipidemia Zetia 10 mg p.o. daily   CAD s/p PCI/stent Aspirin 81 mg p.o. daily, Plavix 75 mg p.o. daily   Anxiety Schizoaffective disorder Alprazolam 1 mg p.o. 3 times daily, prescribed Depakote outpatient, but apparently has not taken for 1 month; will hold for now and defer to outpatient   Tobacco use disorder Counseled on need for complete cessation   Morbid obesity Body mass index is 36.1 kg/m. Discussed with patient needs for aggressive lifestyle changes/weight loss as this complicates all facets of care.  Outpatient follow-up with PCP.   Discharge Diagnoses:  Principal Problem:   FUO (fever of unknown origin) Active Problems:   Posterior cervical adenopathy   Schizoaffective disorder (HCC)   Benign essential HTN   Cigarette smoker   Mixed dyslipidemia   COPD (chronic obstructive pulmonary disease) (HCC)   GERD (gastroesophageal reflux disease)   Obesity (BMI 30-39.9)   Coronary artery disease   Elevated LFTs   Chronic dental pain   Recurrent fever   Left knee pain   Tick bites   Fever    Discharge Instructions  Discharge Instructions     Call MD for:  difficulty breathing, headache or visual disturbances   Complete by: As directed    Call MD for:  extreme fatigue   Complete by: As  directed    Call MD for:  persistant dizziness or light-headedness   Complete by: As directed    Call MD for:  persistant nausea and vomiting   Complete by: As directed    Call MD for:  severe uncontrolled pain   Complete by: As directed    Call MD for:  temperature >100.4   Complete by: As directed    Diet - low sodium heart healthy   Complete by: As directed    Increase activity slowly   Complete by: As directed    No wound care   Complete by: As directed       Allergies as of 05/30/2021       Reactions   Prednisone Shortness Of Breath   Mental reaction Other reaction(s): Vasculitis   Atorvastatin    Memory problem   Chantix [varenicline Tartrate]    Loss of migraine and vision   Oxycodone Itching   Itching   Triamcinolone Acetonide Other (See Comments)   Valtrex [valacyclovir Hcl]    Auditory hallucinations/ voices        Medication List     TAKE these medications    ALPRAZolam 1 MG tablet Commonly known as: XANAX Take 1 mg by mouth 3 (three) times daily.   amLODipine 10 MG tablet Commonly known as: NORVASC Take 10 mg by mouth at  bedtime.   aspirin EC 81 MG tablet Take 1 tablet (81 mg total) by mouth daily. Swallow whole.   clopidogrel 75 MG tablet Commonly known as: PLAVIX TAKE 1 TABLET BY MOUTH  DAILY WITH BREAKFAST   divalproex 250 MG DR tablet Commonly known as: DEPAKOTE Take 250 mg by mouth 2 (two) times daily.   Edarbi 40 MG Tabs Generic drug: Azilsartan Medoxomil Take 40 mg by mouth daily.   ELDERBERRY PO Take 1 tablet by mouth daily.   EPINEPHrine 0.3 mg/0.3 mL Soaj injection Commonly known as: EPI-PEN Inject 0.3 mg into the muscle as needed for anaphylaxis.   ezetimibe 10 MG tablet Commonly known as: ZETIA Take 10 mg by mouth every evening.   Fish Oil 1000 MG Caps Take 1 capsule by mouth every evening.   furosemide 20 MG tablet Commonly known as: LASIX Take 20 mg by mouth daily as needed for fluid.   hydrALAZINE 50 MG  tablet Commonly known as: APRESOLINE Take 50 mg by mouth 3 (three) times daily.   meloxicam 15 MG tablet Commonly known as: MOBIC Take 15 mg by mouth as needed for pain.   metoprolol succinate 100 MG 24 hr tablet Commonly known as: TOPROL-XL Take 100 mg by mouth daily.   Nexlizet 180-10 MG Tabs Generic drug: Bempedoic Acid-Ezetimibe Take 1 tablet by mouth daily.   nitroGLYCERIN 0.4 MG SL tablet Commonly known as: NITROSTAT Place 0.4 mg under the tongue every 5 (five) minutes as needed for chest pain. Every 5 minutes   ondansetron 8 MG tablet Commonly known as: ZOFRAN Take 8 mg by mouth 3 (three) times daily.   pantoprazole 40 MG tablet Commonly known as: PROTONIX Take 40 mg by mouth at bedtime.   potassium chloride 10 MEQ tablet Commonly known as: KLOR-CON M Take 20 mEq by mouth 2 (two) times daily.   SYSTANE OP Place 1 drop into both eyes daily as needed (dry eyes).   vitamin E 45 MG (100 UNITS) capsule Take 100 Units by mouth daily.        Follow-up Information     Ronita Hipps, MD. Schedule an appointment as soon as possible for a visit in 1 week(s).   Specialty: Family Medicine Contact information: [place]         Tommy Medal, Lavell Islam, MD. Schedule an appointment as soon as possible for a visit.   Specialty: Infectious Diseases Contact information: [address] [place] [address] [phone]                Allergies  Allergen Reactions   Prednisone Shortness Of Breath    Mental reaction Other reaction(s): Vasculitis     Atorvastatin     Memory problem   Chantix [Varenicline Tartrate]     Loss of migraine and vision   Oxycodone Itching    Itching   Triamcinolone Acetonide Other (See Comments)   Valtrex [Valacyclovir Hcl]     Auditory hallucinations/ voices    Consultations: Infectious disease, Dr. Lucianne Lei dam   Procedures/Studies: DG Orthopantogram  Result Date:  05/29/2021 CLINICAL DATA:  Dental pain. EXAM: ORTHOPANTOGRAM/PANORAMIC COMPARISON:  None Available. FINDINGS: No fracture or bone lesion. Previous fillings involving bilateral molars. No un treated dental carie. No periapical lucency to suggest a root abscess. Soft tissues are unremarkable. IMPRESSION: Negative. Electronically Signed   By: Lajean Manes M.D.   On: 05/29/2021 15:07   CT CHEST ABDOMEN PELVIS W CONTRAST  Result Date: 05/29/2021  CLINICAL DATA:  Fever of unknown origin. EXAM: CT CHEST, ABDOMEN, AND PELVIS WITH CONTRAST TECHNIQUE: Multidetector CT imaging of the chest, abdomen and pelvis was performed following the standard protocol during bolus administration of intravenous contrast. RADIATION DOSE REDUCTION: This exam was performed according to the departmental dose-optimization program which includes automated exposure control, adjustment of the mA and/or kV according to patient size and/or use of iterative reconstruction technique. CONTRAST:  140m OMNIPAQUE IOHEXOL 300 MG/ML  SOLN COMPARISON:  CT abdomen and pelvis 03/18/2018. FINDINGS: CT CHEST FINDINGS Cardiovascular: No significant vascular findings. Normal heart size. No pericardial effusion. Mediastinum/Nodes: No enlarged mediastinal, hilar, or axillary lymph nodes. Thyroid gland, trachea, and esophagus demonstrate no significant findings. Lungs/Pleura: There are minimal peripheral ground-glass opacities throughout the right lung. There is no focal lung infiltrate, pleural effusion or pneumothorax. Musculoskeletal: No fracture. There is a rounded subcutaneous fluid attenuation area in the right posterior soft tissues of the upper back which may represent a sebaceous cyst. CT ABDOMEN PELVIS FINDINGS Hepatobiliary: No focal liver abnormality is seen. No gallstones, gallbladder wall thickening, or biliary dilatation. Pancreas: Unremarkable. No pancreatic ductal dilatation or surrounding inflammatory changes. Spleen: Normal in size without focal  abnormality. Adrenals/Urinary Tract: Adrenal glands are unremarkable. Kidneys are normal, without renal calculi, focal lesion, or hydronephrosis. Bladder is unremarkable. Stomach/Bowel: Stomach is within normal limits. Appendix appears normal. No evidence of bowel wall thickening, distention, or inflammatory changes.There is sigmoid colon diverticulosis without evidence for acute diverticulitis. Vascular/Lymphatic: Aortic atherosclerosis. No enlarged abdominal or pelvic lymph nodes. Reproductive: Prostate gland is within normal limits. Other: Small fat containing left inguinal hernia.  No ascites. Musculoskeletal: No acute or significant osseous findings. IMPRESSION: 1. Minimal peripheral ground-glass opacities in the right lung may represent atelectasis. No definite focal lung infiltrate. 2. No acute localizing process in the abdomen or pelvis. 3. Sigmoid colon diverticulosis. 4. Stable fat containing left inguinal hernia. 5.  Aortic Atherosclerosis (ICD10-I70.0). 6. Likely sebaceous cyst in the posterior soft tissues of the upper right chest. Correlate clinically. Electronically Signed   By: ARonney AstersM.D.   On: 05/29/2021 02:18   DG Knee Complete 4 Views Right  Result Date: 05/29/2021 CLINICAL DATA:  Pain. EXAM: RIGHT KNEE - COMPLETE 4+ VIEW COMPARISON:  None Available. FINDINGS: Mild chronic spurring at the medial collateral ligament origin off of the superomedial femoral condyle. Minimal medial compartment joint space narrowing. Enthesopathic change is mild at the quadriceps and minimal patellar tendon insertions on the patella. No knee joint effusion. No acute fracture or dislocation. IMPRESSION: 1. Mild medial compartment osteoarthritis. 2. Chronic enthesopathic change at the proximal medial collateral ligament origin and the insertions of the quadriceps and patellar tendons on the patella. Electronically Signed   By: RYvonne KendallM.D.   On: 05/29/2021 16:04   ECHOCARDIOGRAM COMPLETE  Result  Date: 05/29/2021    ECHOCARDIOGRAM REPORT   Patient Name:   DKACEE KORENDate of Exam: 05/29/2021 Medical Rec #:  [id number]   Height:       69.0 in Accession #:    [id number]  Weight:       248.9 lb Date of Birth:  101/16/1972  BSA:          2.267 m Patient Age:    89years     BP:           149/88 mmHg Patient Gender: M            HR:  72 bpm. Exam Location:  Inpatient Procedure: 2D Echo, Cardiac Doppler and Color Doppler Indications:    Fever  History:        Patient has no prior history of Echocardiogram examinations.                 CAD; Risk Factors:Hypertension, Dyslipidemia and Current Smoker.  Sonographer:    Clayton Lefort RDCS (AE) Referring Phys: [id number] Zyanna Leisinger CHEN  Sonographer Comments: Suboptimal subcostal window and patient is morbidly obese. IMPRESSIONS  1. No obvious vegetations but valves and atrial septum poorly visualized.  2. Left ventricular ejection fraction, by estimation, is 60 to 65%. The left ventricle has normal function. The left ventricle has no regional wall motion abnormalities. There is mild left ventricular hypertrophy. Left ventricular diastolic parameters were normal.  3. Right ventricular systolic function is normal. The right ventricular size is normal.  4. Left atrial size was mildly dilated.  5. The mitral valve is abnormal. Mild mitral valve regurgitation. No evidence of mitral stenosis.  6. The aortic valve is tricuspid. There is mild calcification of the aortic valve. Aortic valve regurgitation is not visualized. Aortic valve sclerosis is present, with no evidence of aortic valve stenosis.  7. The inferior vena cava is normal in size with greater than 50% respiratory variability, suggesting right atrial pressure of 3 mmHg. FINDINGS  Left Ventricle: Left ventricular ejection fraction, by estimation, is 60 to 65%. The left ventricle has normal function. The left ventricle has no regional wall motion abnormalities. The left ventricular internal cavity size was normal in  size. There is  mild left ventricular hypertrophy. Left ventricular diastolic parameters were normal. Right Ventricle: The right ventricular size is normal. No increase in right ventricular wall thickness. Right ventricular systolic function is normal. Left Atrium: Left atrial size was mildly dilated. Right Atrium: Right atrial size was normal in size. Pericardium: There is no evidence of pericardial effusion. Mitral Valve: The mitral valve is abnormal. There is mild thickening of the mitral valve leaflet(s). There is mild calcification of the mitral valve leaflet(s). Mild mitral valve regurgitation. No evidence of mitral valve stenosis. Tricuspid Valve: The tricuspid valve is normal in structure. Tricuspid valve regurgitation is not demonstrated. No evidence of tricuspid stenosis. Aortic Valve: The aortic valve is tricuspid. There is mild calcification of the aortic valve. Aortic valve regurgitation is not visualized. Aortic valve sclerosis is present, with no evidence of aortic valve stenosis. Aortic valve mean gradient measures 7.0 mmHg. Aortic valve peak gradient measures 13.7 mmHg. Aortic valve area, by VTI measures 3.40 cm. Pulmonic Valve: The pulmonic valve was normal in structure. Pulmonic valve regurgitation is not visualized. No evidence of pulmonic stenosis. Aorta: The aortic root is normal in size and structure. Venous: The inferior vena cava is normal in size with greater than 50% respiratory variability, suggesting right atrial pressure of 3 mmHg. IAS/Shunts: The interatrial septum was not well visualized. Additional Comments: No obvious vegetations but valves and atrial septum poorly visualized.  LEFT VENTRICLE PLAX 2D LVIDd:         5.00 cm   Diastology LVIDs:         2.90 cm   LV e' medial:    6.64 cm/s LV PW:         1.40 cm   LV E/e' medial:  11.9 LV IVS:        1.20 cm   LV e' lateral:   8.16 cm/s LVOT diam:     2.20 cm  LV E/e' lateral: 9.7 LV SV:         124 LV SV Index:   54 LVOT Area:      3.80 cm  RIGHT VENTRICLE RV Basal diam:  2.50 cm RV S prime:     12.00 cm/s TAPSE (M-mode): 2.3 cm LEFT ATRIUM           Index        RIGHT ATRIUM           Index LA diam:      4.20 cm 1.85 cm/m   RA Area:     12.50 cm LA Vol (A4C): 84.5 ml 37.27 ml/m  RA Volume:   25.00 ml  11.03 ml/m  AORTIC VALVE AV Area (Vmax):    3.31 cm AV Area (Vmean):   3.24 cm AV Area (VTI):     3.40 cm AV Vmax:           185.00 cm/s AV Vmean:          122.000 cm/s AV VTI:            0.363 m AV Peak Grad:      13.7 mmHg AV Mean Grad:      7.0 mmHg LVOT Vmax:         161.00 cm/s LVOT Vmean:        104.000 cm/s LVOT VTI:          0.325 m LVOT/AV VTI ratio: 0.90  AORTA Ao Root diam: 3.10 cm Ao Asc diam:  3.10 cm MITRAL VALVE MV Area (PHT): 3.60 cm    SHUNTS MV Decel Time: 211 msec    Systemic VTI:  0.32 m MV E velocity: 79.20 cm/s  Systemic Diam: 2.20 cm MV A velocity: 90.50 cm/s MV E/A ratio:  0.88 Jenkins Rouge MD Electronically signed by Jenkins Rouge MD Signature Date/Time: 05/29/2021/11:10:35 AM    Final      Subjective: Patient seen examined bedside, resting comfortably.  Spouse present.  No complaints this morning.  Continues to remain afebrile during the entirety of the hospitalization.  Discussed with ID, okay for discharge home with outpatient follow-up regarding the remaining lab results.  No other complaints or concerns at this time.  Denies headache, no chest pain, no palpitations, no shortness of breath, no fever/chills/night sweats, no nausea/vomiting/diarrhea, no abdominal pain, no focal weakness, no cough/congestion, no fatigue, no paresthesias.  No acute events overnight per nursing staff.  Discharge Exam: Vitals:   05/30/21 0349 05/30/21 0809  BP: 140/76 (!) 181/101  Pulse: 62 73  Resp: 18 18  Temp: 98.7 F (37.1 C) 97.8 F (36.6 C)  SpO2: 97% 97%   Vitals:   05/29/21 1854 05/29/21 2020 05/30/21 0349 05/30/21 0809  BP: 140/79 135/81 140/76 (!) 181/101  Pulse: 62 70 62 73  Resp: _0 Temp:  98.5 F (36.9 C) 98.1 F (36.7 C) 98.7 F (37.1 C) 97.8 F (36.6 C)  TempSrc: Oral Oral Oral Oral  SpO2: 97% 97% 97% 97%  Weight:      Height:        Physical Exam: GEN: NAD, alert and oriented x 3, obese HEENT: NCAT, PERRL, EOMI, sclera clear, MMM PULM: CTAB w/o wheezes/crackles, normal respiratory effort CV: RRR w/o M/G/R GI: abd soft, NTND, NABS, no R/G/M MSK: no peripheral edema, muscle strength globally intact 5/5 bilateral upper/lower extremities NEURO: CN II-XII intact, no focal deficits, sensation to light touch intact PSYCH: normal mood/affect Integumentary: Right posterior knee  with slight induration without erythema at site of previous tick bite, induration right anterior thigh without erythema previous tick bite, small cervical posterior nodule appreciated, nontender to palpation without underlying fluctuance/erythema    The results of significant diagnostics from this hospitalization (including imaging, microbiology, ancillary and laboratory) are listed below for reference.     Microbiology: Recent Results (from the past 240 hour(s))  Culture, blood (Routine X 2) w Reflex to ID Panel     Status: None (Preliminary result)   Collection Time: 05/28/21  6:46 PM   Specimen: BLOOD  Result Value Ref Range Status   Specimen Description BLOOD SITE NOT SPECIFIED  Final   Special Requests   Final    BOTTLES DRAWN AEROBIC AND ANAEROBIC Blood Culture results may not be optimal due to an inadequate volume of blood received in culture bottles   Culture   Final    NO GROWTH < 12 HOURS Performed at [facility], [address], [place], [place] [address]    Report Status PENDING  Incomplete  Culture, blood (Routine X 2) w Reflex to ID Panel     Status: None (Preliminary result)   Collection Time: 05/28/21  9:35 PM   Specimen: BLOOD  Result Value Ref Range Status   Specimen Description BLOOD RIGHT ANTECUBITAL  Final   Special Requests   Final    BOTTLES DRAWN AEROBIC  AND ANAEROBIC Blood Culture adequate volume   Culture   Final    NO GROWTH < 12 HOURS Performed at [facility], [place] [address] [place], [place] [address]    Report Status PENDING  Incomplete  SARS Coronavirus 2 by RT PCR (hospital order, performed in [facility] hospital lab) *cepheid single result test* Anterior Nasal Swab     Status: None   Collection Time: 05/28/21 10:24 PM   Specimen: Anterior Nasal Swab  Result Value Ref Range Status   SARS Coronavirus 2 by RT PCR NEGATIVE NEGATIVE Final    Comment: Performed at [facility], [place] [address] [place], [place] [address]     Labs: BNP (last 3 results) No results for input(s): BNP in the last 8760 hours. Basic Metabolic Panel: Recent Labs  Lab 05/28/21 1643 05/29/21 0616 05/30/21 0026  NA 139 138 142  K 3.8 3.4* 3.2*  CL 103 103 106  CO2 _0 GLUCOSE 81 103* 114*  BUN _1 CREATININE 1.02 1.05 1.06  CALCIUM 9.0 8.6* 8.9  MG  --   --  2.1   Liver Function Tests: Recent Labs  Lab 05/28/21 1643 05/29/21 0616 05/30/21 0026  AST 70* 53* 52*  ALT 90* 78* 73*  ALKPHOS 79 75 71  BILITOT 0.1* 0.4 0.4  PROT 6.8 6.6 6.4*  ALBUMIN 3.6 3.5 3.4*   No results for input(s): LIPASE, AMYLASE in the last 168 hours. No results for input(s): AMMONIA in the last 168 hours. CBC: Recent Labs  Lab 05/28/21 1643 05/29/21 0616 05/30/21 0026  WBC 9.7 8.4 8.1  NEUTROABS  --  4.3  --   HGB 15.8 15.1 14.3  HCT 44.9 42.9 41.0  MCV 90.9 90.7 90.9  PLT 225 192 179   Cardiac Enzymes: Recent Labs  Lab 05/28/21 2134  CKTOTAL 95   BNP: Invalid input(s): POCBNP CBG: No results for input(s): GLUCAP in the last 168 hours. D-Dimer No results for input(s): DDIMER in the last 72 hours. Hgb A1c No results for input(s): HGBA1C in the  last 72 hours. Lipid Profile No results for input(s): CHOL, HDL, LDLCALC, TRIG, CHOLHDL, LDLDIRECT in the last 72 hours. Thyroid function studies No results for input(s): TSH,  T4TOTAL, T3FREE, THYROIDAB in the last 72 hours.  Invalid input(s): FREET3 Anemia work up No results for input(s): VITAMINB12, FOLATE, FERRITIN, TIBC, IRON, RETICCTPCT in the last 72 hours. Urinalysis    Component Value Date/Time   COLORURINE YELLOW 05/28/2021 2233   APPEARANCEUR HAZY (A) 05/28/2021 2233   LABSPEC 1.018 05/28/2021 2233   PHURINE 5.0 05/28/2021 2233   GLUCOSEU NEGATIVE 05/28/2021 2233   HGBUR NEGATIVE 05/28/2021 2233   BILIRUBINUR NEGATIVE 05/28/2021 2233   KETONESUR NEGATIVE 05/28/2021 2233   PROTEINUR 100 (A) 05/28/2021 2233   UROBILINOGEN 0.2 07/26/2007 1700   NITRITE NEGATIVE 05/28/2021 2233   LEUKOCYTESUR NEGATIVE 05/28/2021 2233   Sepsis Labs Invalid input(s): PROCALCITONIN,  WBC,  LACTICIDVEN Microbiology Recent Results (from the past 240 hour(s))  Culture, blood (Routine X 2) w Reflex to ID Panel     Status: None (Preliminary result)   Collection Time: 05/28/21  6:46 PM   Specimen: BLOOD  Result Value Ref Range Status   Specimen Description BLOOD SITE NOT SPECIFIED  Final   Special Requests   Final    BOTTLES DRAWN AEROBIC AND ANAEROBIC Blood Culture results may not be optimal due to an inadequate volume of blood received in culture bottles   Culture   Final    NO GROWTH < 12 HOURS Performed at [facility], [place] [address], [place], [place] [address]    Report Status PENDING  Incomplete  Culture, blood (Routine X 2) w Reflex to ID Panel     Status: None (Preliminary result)   Collection Time: 05/28/21  9:35 PM   Specimen: BLOOD  Result Value Ref Range Status   Specimen Description BLOOD RIGHT ANTECUBITAL  Final   Special Requests   Final    BOTTLES DRAWN AEROBIC AND ANAEROBIC Blood Culture adequate volume   Culture   Final    NO GROWTH < 12 HOURS Performed at [facility], [place] [address] [place], [place] [address]    Report Status PENDING  Incomplete  SARS Coronavirus 2 by RT PCR (hospital order, performed in [facility] hospital  lab) *cepheid single result test* Anterior Nasal Swab     Status: None   Collection Time: 05/28/21 10:24 PM   Specimen: Anterior Nasal Swab  Result Value Ref Range Status   SARS Coronavirus 2 by RT PCR NEGATIVE NEGATIVE Final    Comment: Performed at [facility], [place] [address], [place], [place] [address]     Time coordinating discharge: Over 30 minutes  SIGNED:   Maeola Mchaney J [place] DO  [facility] 05/30/2021, 10:46 AM

## 2021-05-30 NOTE — TOC Initial Note (Signed)
Transition of Care [facility]) - Initial/Assessment Note    Patient Details  Name: Cameron Bryan MRN: [id number] Date of Birth: [birth year]  [facility]) CM/SW Contact:    Bess Kinds, RN Phone Number: [phone] 05/30/2021, 8:23 AM  Clinical Narrative:                  Spoke with patient on his cell phone. Stated that his wife is on her way to visit. He is hoping to be discharged today, but understands that he has to be cleared medically. TOC following for transition needs.   Expected Discharge Plan: Home/Self Care Barriers to Discharge: Continued Medical Work up   Patient Goals and CMS Choice   CMS Medicare.gov Compare Post Acute Care list provided to:: Patient Choice offered to / list presented to : NA  Expected Discharge Plan and Services Expected Discharge Plan: Home/Self Care In-house Referral: NA Discharge Planning Services: CM Consult Post Acute Care Choice: NA Living arrangements for the past 2 months: Single Family Home                 DME Arranged: N/A DME Agency: NA       HH Arranged: NA HH Agency: NA        Prior Living Arrangements/Services Living arrangements for the past 2 months: Single Family Home Lives with:: Self, Spouse Patient language and need for interpreter reviewed:: Yes Do you feel safe going back to the place where you live?: Yes      Need for Family Participation in Patient Care: No (Comment)     Criminal Activity/Legal Involvement Pertinent to Current Situation/Hospitalization: No - Comment as needed  Activities of Daily Living Home Assistive Devices/Equipment: None ADL Screening (condition at time of admission) Patient's cognitive ability adequate to safely complete daily activities?: Yes Is the patient deaf or have difficulty hearing?: No Does the patient have difficulty seeing, even when wearing glasses/contacts?: No Does the patient have difficulty concentrating, remembering, or making decisions?: No Patient able  to express need for assistance with ADLs?: Yes Does the patient have difficulty dressing or bathing?: No Independently performs ADLs?: Yes (appropriate for developmental age) Does the patient have difficulty walking or climbing stairs?: No Weakness of Legs: None Weakness of Arms/Hands: None  Permission Sought/Granted                  Emotional Assessment Appearance:: Appears stated age Attitude/Demeanor/Rapport: Engaged Affect (typically observed): Accepting Orientation: : Oriented to Self, Oriented to Place, Oriented to  Time, Oriented to Situation Alcohol / Substance Use: Not Applicable Psych Involvement: No (comment)  Admission diagnosis:  FUO (fever of unknown origin) [R50.9] Fever, unspecified fever cause [R50.9] Patient Active Problem List   Diagnosis Date Noted   Chronic dental pain 05/29/2021   Recurrent fever 05/29/2021   Left knee pain 05/29/2021   Tick bites 05/29/2021   Fever    FUO (fever of unknown origin) 05/28/2021   Posterior cervical adenopathy 05/28/2021   Elevated LFTs 05/28/2021   Coronary artery disease 05/03/2021   Obesity (BMI 30-39.9) 10/11/2020   Statin intolerance 10/11/2020   Stroke (HCC)    Chronic coronary artery disease 09/26/2019   Common migraine    COPD (chronic obstructive pulmonary disease) (HCC)    Depression    GERD (gastroesophageal reflux disease)    Angina pectoris (HCC) 08/29/2019   Ganglion cyst of volar aspect of left wrist 05/06/2019   Mixed dyslipidemia 12/04/2018   Cigarette smoker 07/23/2018   Pedal edema  07/23/2018   Schizoaffective disorder (HCC)    Benign essential HTN    Bipolar 1 disorder (HCC)    PCP:  Marylen Ponto, MD Pharmacy:   [facility] - [place], [place] - [address] [place] [address] Phone: [phone] Fax: [phone]  [facility]) - [place], [place] - [address] [facility] [address] [place] [address] [place]  [address] Phone: [phone] Fax: [phone]     Social Determinants of Health (SDOH) Interventions    Readmission Risk Interventions     View : No data to display.

## 2021-05-30 NOTE — Care Management Obs Status (Signed)
MEDICARE OBSERVATION STATUS NOTIFICATION   Patient Details  Name: Cameron Bryan MRN: [id number] Date of Birth: May 04, 1970   Medicare Observation Status Notification Given:  Yes    Bess Kinds, RN 05/30/2021, 8:19 AM

## 2021-05-30 NOTE — Progress Notes (Signed)
Discharge instructions given to pt. Pt verbalized understanding of all teaching and had no further questions. IV removed. Pt discharged home with sister via wheelchair with all belongings.

## 2021-05-31 LAB — EPSTEIN-BARR VIRUS (EBV) ANTIBODY PROFILE
EBV NA IgG: 76.3 U/mL — ABNORMAL HIGH (ref 0.0–17.9)
EBV VCA IgG: >600 U/mL — ABNORMAL HIGH (ref 0.0–17.9)
EBV VCA IgM: <36 U/mL (ref 0.0–35.9)

## 2021-05-31 LAB — RHEUMATOID FACTOR
Rheumatoid fact SerPl-aCnc: <10 IU/mL (ref <14.0)
Rheumatoid fact SerPl-aCnc: <10 IU/mL (ref <14.0)

## 2021-05-31 LAB — ANA: Anti Nuclear Antibody (ANA): NEGATIVE

## 2021-05-31 LAB — ANA W/REFLEX IF POSITIVE: Anti Nuclear Antibody (ANA): NEGATIVE

## 2021-05-31 LAB — ROCKY MTN SPOTTED FVR ABS PNL(IGG+IGM)
RMSF IgG: NEGATIVE
RMSF IgM: 0.35 index (ref 0.00–0.89)

## 2021-06-01 LAB — PROTEIN ELECTROPHORESIS, SERUM
A/G Ratio: 1.1 (ref 0.7–1.7)
Albumin ELP: 3.5 g/dL (ref 2.9–4.4)
Alpha-1-Globulin: 0.2 g/dL (ref 0.0–0.4)
Alpha-2-Globulin: 0.7 g/dL (ref 0.4–1.0)
Beta Globulin: 1 g/dL (ref 0.7–1.3)
Gamma Globulin: 1.3 g/dL (ref 0.4–1.8)
Globulin, Total: 3.2 g/dL (ref 2.2–3.9)
Total Protein ELP: 6.7 g/dL (ref 6.0–8.5)

## 2021-06-01 LAB — ANGIOTENSIN CONVERTING ENZYME: Angiotensin-Converting Enzyme: 81 U/L (ref 14–82)

## 2021-06-01 LAB — ANCA PROFILE
Anti-MPO Antibodies: <0.2 units (ref 0.0–0.9)
Anti-PR3 Antibodies: <0.2 units (ref 0.0–0.9)
Atypical P-ANCA titer: <1:20 {titer}
C-ANCA: <1:20 {titer}
P-ANCA: <1:20 {titer}

## 2021-06-01 LAB — LYME DISEASE SEROLOGY W/REFLEX: Lyme Total Antibody EIA: NEGATIVE

## 2021-06-01 LAB — CMV ANTIBODY, IGG (EIA): CMV Ab - IgG: >10 U/mL — ABNORMAL HIGH (ref 0.00–0.59)

## 2021-06-02 LAB — QUANTIFERON-TB GOLD PLUS (RQFGPL)
QuantiFERON Mitogen Value: >10 IU/mL
QuantiFERON Nil Value: 0.09 IU/mL
QuantiFERON TB1 Ag Value: 0.13 IU/mL
QuantiFERON TB2 Ag Value: 0.13 IU/mL

## 2021-06-02 LAB — CULTURE, BLOOD (ROUTINE X 2)
Culture: NO GROWTH
Culture: NO GROWTH
Special Requests: ADEQUATE

## 2021-06-07 ENCOUNTER — Ambulatory Visit (HOSPITAL_COMMUNITY)
Admission: AD | Admit: 2021-06-07 | Discharge: 2021-06-07 | Disposition: A | Discharge status: Home / Self Care | Payer: 59 | Source: Intra-hospital | Attending: Psychiatry | Admitting: Psychiatry

## 2021-06-07 ENCOUNTER — Ambulatory Visit: Payer: 59 | Admitting: Infectious Disease

## 2021-06-07 NOTE — H&P (Cosign Needed Addendum)
Behavioral Health Medical Screening Exam  HPI: Cameron Bryan is a 51 y.o. Caucasian male who presented voluntarily accompanied by wife to [facility] for symptoms of depression. Patient has a PPHx of schizoaffective d/o, Bipolar 1 d/o, and Depression, and several medical comorbidity. He lives at home with his wife.  On assessment today, patient is examined sitting calmly on the bench in the screen room. Chart reviewed and findings shared with the tx team and discussed with the Dr. Lucianne Muss. A/O x 4. Speech clear and coherent and with normal pattern and volume. Mood/Affect appropriate, anxious and depressed. Thought process coherent and linear. Thought content  WNL. Memory, Judgement and Insight fair. Instruct to call his PCP and report elevated BP of 188/111 without symptoms.  Reported having hx of depression greater than 2 years and this time, worsening of  depression was exacerbated by his father's death in 03/31/2021. Rated Anxiety of 4/10 on a scale of 0 to 10, 10 being the highest and taking xanax prn for anxiety. Endorsed symptoms of depression as self isolation, crying spells, irritability, and hopelessness. Endorsed being followed by a therapist and psychiatrist at the [facility] therapy at [place], [place]. Endorsed taking medications as prescribed.  Endorsed family hx of mental illness. Brother diagnosed with depression. Endorsed Alcohol use of 12 packs 2 x / week and cigarette smoking of 2 to 3 packs / week. Instructions provided on cessation of alcohol / tobacco smoking and the adverse effects on the body system. Indicated understanding of instructions. Endorsed being safe at home, firearm at home locked up and support system being his wife. Denied HI, SI, AVH, suicidal attempt, self injurious behavior or drug use or dependence.  Disposition: Based on my evaluation of patient, there is no evidence of imminent risk to self or others at present and patient does not meet criteria for psychiatric inpatient  admission and thus is psych cleared. Supportive therapy provided about ongoing stressors. Discussed crisis plan, support from social network, calling 911, coming to the Emergency Department, and calling Suicide Hotline. [facility] staff made aware of patient disposition. Patient and wife left [facility] without any incident.      Total Time spent with patient: 1 hour  Psychiatric Specialty Exam:  Presentation  General Appearance: Appropriate for Environment; Casual; Fairly Groomed  Eye Contact:Good  Speech:Clear and Coherent; Normal Rate  Speech Volume:Normal  Handedness:Right  Mood and Affect  Mood:Anxious; Depressed; Hopeless  Affect:Appropriate; Congruent  Thought Process  Thought Processes:Coherent; Linear  Descriptions of Associations:Intact  Orientation:Full (Time, Place and Person)  Thought Content:Logical; Tangential  History of Schizophrenia/Schizoaffective disorder: Schizoaffective d/o Duration of Psychotic Symptoms: Greater than 6 months  Hallucinations:Hallucinations: None  Ideas of Reference:None  Suicidal Thoughts:Suicidal Thoughts: No  Homicidal Thoughts:Homicidal Thoughts: No  Sensorium  Memory:Immediate Fair; Recent Fair; Remote Fair  Judgment:Fair  Insight:Fair  Executive Functions  Concentration:Good  Attention Span:Good  Recall:Good  Fund of Knowledge:Fair  Language:Good  Psychomotor Activity  Psychomotor Activity:Psychomotor Activity: Normal  Assets  Assets:Communication Skills; Financial Resources/Insurance; Physical Health; Housing; Social Support  Sleep  Sleep:Sleep: Good Number of Hours of Sleep: 10  Physical Exam: Physical Exam Vitals and nursing note reviewed.  Constitutional:      Appearance: Normal appearance.  HENT:     Head: Normocephalic and atraumatic.     Right Ear: External ear normal.     Left Ear: External ear normal.     Nose: Nose normal.     Mouth/Throat:     Mouth: Mucous membranes are moist.  Pharynx: Oropharynx is clear.  Eyes:     Extraocular Movements: Extraocular movements intact.     Conjunctiva/sclera: Conjunctivae normal.     Pupils: Pupils are equal, round, and reactive to light.  Cardiovascular:     Rate and Rhythm: Normal rate.     Pulses: Normal pulses.     Comments: BP 188/111, Instruct to call and inform his PCP. Pulmonary:     Effort: Pulmonary effort is normal.  Abdominal:     Palpations: Abdomen is soft.  Genitourinary:    Comments: Deferred Musculoskeletal:        General: Normal range of motion.     Cervical back: Normal range of motion and neck supple.  Skin:    General: Skin is warm.  Neurological:     General: No focal deficit present.     Mental Status: He is alert and oriented to person, place, and time.  Psychiatric:        Behavior: Behavior normal.   Review of Systems  Constitutional: Negative.  Negative for chills and fever.  HENT: Negative.  Negative for hearing loss and tinnitus.   Eyes: Negative.  Negative for blurred vision and double vision.  Respiratory: Negative.  Negative for cough, sputum production, shortness of breath and wheezing.   Cardiovascular: Negative.  Negative for chest pain and palpitations.  Gastrointestinal: Negative.  Negative for abdominal pain, constipation, diarrhea, heartburn, nausea and vomiting.  Genitourinary: Negative.  Negative for dysuria, frequency and urgency.  Musculoskeletal: Negative.  Negative for back pain, falls, joint pain, myalgias and neck pain.  Skin: Negative.  Negative for rash.  Neurological: Negative.  Negative for dizziness, tingling, tremors, sensory change, speech change, focal weakness, seizures, loss of consciousness, weakness and headaches.  Endo/Heme/Allergies: Negative.  Negative for environmental allergies and polydipsia. Does not bruise/bleed easily.       See allergies listed  Psychiatric/Behavioral:  Positive for depression and substance abuse. The patient is nervous/anxious.    Blood pressure (!) 188/111, pulse 75, temperature 98.4 F (36.9 C), temperature source Oral, resp. rate 16, SpO2 100 %. There is no height or weight on file to calculate BMI.  Musculoskeletal: Strength & Muscle Tone: within normal limits Gait & Station: normal Patient leans: N/A   Recommendations:  Based on my evaluation the patient does not appear to have an emergency psychiatric condition.  Cecilie Lowers, FNP 06/07/2021, 6:54 PM

## 2021-06-09 ENCOUNTER — Other Ambulatory Visit: Payer: Self-pay

## 2021-06-09 ENCOUNTER — Telehealth: Payer: Self-pay

## 2021-06-09 DIAGNOSIS — A689 Relapsing fever, unspecified: Secondary | ICD-10-CM

## 2021-06-09 NOTE — Telephone Encounter (Signed)
Lab orders entered. Called and spoke with patient's wife, who verbalized understanding that labs could be drawn at [facility]. Patient has an appointment on 6/9. Note about labs added to appointment.  Binnie Kand, RN

## 2021-06-09 NOTE — Telephone Encounter (Signed)
Please put requested labs, including peripheral blood smear, cbc with diff, and cmp for me Thanks

## 2021-06-09 NOTE — Addendum Note (Signed)
Addended by: Primus Bravo E on: 06/09/2021 05:18 PM   Modules accepted: Orders

## 2021-06-09 NOTE — Telephone Encounter (Signed)
Patient's wife Darl Pikes called and stated the patient has started running high fevers (101F) at home for the past week. States that Dr. Daiva Eves told her the patient would need a CBC with diff if he became febrile again. Wife is requesting orders for bloodwork.  Will route to covering provider. Please advise.  Wyvonne Lenz, RN

## 2021-06-11 ENCOUNTER — Other Ambulatory Visit: Payer: Self-pay

## 2021-06-11 ENCOUNTER — Ambulatory Visit (INDEPENDENT_AMBULATORY_CARE_PROVIDER_SITE_OTHER): Payer: 59 | Admitting: Infectious Disease

## 2021-06-11 ENCOUNTER — Encounter: Payer: Self-pay | Admitting: Infectious Disease

## 2021-06-11 VITALS — BP 181/112 | HR 87 | Temp 98.1°F | Wt 254.0 lb

## 2021-06-11 DIAGNOSIS — A689 Relapsing fever, unspecified: Secondary | ICD-10-CM | POA: Diagnosis not present

## 2021-06-11 DIAGNOSIS — I10 Essential (primary) hypertension: Secondary | ICD-10-CM

## 2021-06-11 DIAGNOSIS — R509 Fever, unspecified: Secondary | ICD-10-CM

## 2021-06-11 NOTE — Progress Notes (Signed)
Subjective:  Chief complaint follow-up for fever of unknown origin    patient ID: Cameron Bryan, male    DOB: [birth year], 51 y.o.   MRN: [id number]  HPI    51 y.o. male with history of recurrent monthly fevers who had bitten by ticks and was not getting better with doxycycline then given Keflex for potential cellulitis along with further doxycycline.  He was admitted to [facility] for work-up for FUO.  Labs here including CT chest abdomen and pelvis have been unremarkable to explain his fevers.  Visit he became afebrile was discharged.  I did have a suspicion that he could have cyclic neutropenia causing fevers and asked once he had a fever again to have him come to the clinic so CBC differential could be drawn he apparently had a fever this past weekend up over above 101.5 persisted for several days and accompanied by symptoms of profound fatigue and malaise.  He was feeling normal yesterday but now again feels weak and has had a low-grade temperature.  We attempted to have labs drawn through his primary care physician but they would not draw labs because he was having a fever and I suspect they had some concerns about potentially COVID.  Regardless he is here today with going to repeat CBC with differential up with pathology smear and CMP.  I am also endeavoring to see if we can do a familial fever panel.  He does not have much in his ancestry that he knows of to suggest familial Mediterranean fever.  He has more ancestry largely from [place] and [place].    Past Medical History:  Diagnosis Date   Abnormal ECG    Acute pain of left wrist 04/19/2019   Angina pectoris (HCC) 08/29/2019   Bipolar 1 disorder (HCC)    Blurred vision    Cephalalgia    Chest pain 06/05/2014   Chest tightness 07/23/2018   Chronic coronary artery disease 09/26/2019   Chronic dental pain 05/29/2021   Cigarette smoker 07/23/2018   Common migraine    Concussion without loss of  consciousness 11/24/2015   COPD (chronic obstructive pulmonary disease) (HCC)    Depression    EKG abnormalities 06/05/2014   Encounter for orthopedic follow-up care 05/17/2019   Essential hypertension    Ganglion cyst of volar aspect of left wrist 05/06/2019   GERD (gastroesophageal reflux disease)    Headache    Hypertension    Hypokalemia 10/11/2020   Left knee pain 05/29/2021   Mixed dyslipidemia 12/04/2018   Obesity (BMI 30-39.9) 10/11/2020   Pedal edema 07/23/2018   Post concussion syndrome 11/24/2015   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HCC)    Recurrent fever 05/29/2021   Schizoaffective disorder (HCC)    Statin intolerance 10/11/2020   Strain of peroneal tendon 04/13/2020   Stroke (HCC)    Tick bites 05/29/2021   Vision changes 06/05/2014    Past Surgical History:  Procedure Laterality Date   CORONARY STENT INTERVENTION N/A 09/16/2019   Procedure: CORONARY STENT INTERVENTION;  Surgeon: Lyn Records, MD;  Location: [facility] INVASIVE CV LAB;  Service: Cardiovascular;  Laterality: N/A;  distal cfx    CYST REMOVAL HAND     LEFT HEART CATH AND CORONARY ANGIOGRAPHY N/A 09/16/2019   Procedure: LEFT HEART CATH AND CORONARY ANGIOGRAPHY;  Surgeon: Lyn Records, MD;  Location: [facility] INVASIVE CV LAB;  Service: Cardiovascular;  Laterality: N/A;    Family History  Problem Relation Age of Onset   Alzheimer's disease  Paternal Grandfather       Social History   Socioeconomic History   Marital status: Single    Spouse name: Not on file   Number of children: Not on file   Years of education: Not on file   Highest education level: Not on file  Occupational History   Not on file  Tobacco Use   Smoking status: Every Day    Packs/day: 1.00    Types: Cigarettes   Smokeless tobacco: Never  Substance and Sexual Activity   Alcohol use: Yes    Alcohol/week: 12.0 standard drinks of alcohol    Types: 12 Standard drinks or equivalent per week   Drug use: No   Sexual activity: Not on file  Other  Topics Concern   Not on file  Social History Narrative   Not on file   Social Determinants of Health   Financial Resource Strain: Not on file  Food Insecurity: Not on file  Transportation Needs: Not on file  Physical Activity: Not on file  Stress: Not on file  Social Connections: Not on file    Allergies  Allergen Reactions   Prednisone Shortness Of Breath    Mental reaction Other reaction(s): Vasculitis     Atorvastatin     Memory problem   Chantix [Varenicline Tartrate]     Loss of migraine and vision   Oxycodone Itching    Itching   Triamcinolone Acetonide Other (See Comments)   Valtrex [Valacyclovir Hcl]     Auditory hallucinations/ voices     Current Outpatient Medications:    ALPRAZolam (XANAX) 1 MG tablet, Take 1 mg by mouth 3 (three) times daily., Disp: , Rfl:    amLODipine (NORVASC) 10 MG tablet, Take 10 mg by mouth at bedtime. , Disp: , Rfl:    aspirin EC 81 MG tablet, Take 1 tablet (81 mg total) by mouth daily. Swallow whole., Disp: 90 tablet, Rfl: 2   clopidogrel (PLAVIX) 75 MG tablet, TAKE 1 TABLET BY MOUTH  DAILY WITH BREAKFAST, Disp: 90 tablet, Rfl: 3   EDARBI 40 MG TABS, Take 40 mg by mouth daily., Disp: , Rfl:    ELDERBERRY PO, Take 1 tablet by mouth daily., Disp: , Rfl:    EPINEPHrine 0.3 mg/0.3 mL IJ SOAJ injection, Inject 0.3 mg into the muscle as needed for anaphylaxis., Disp: , Rfl:    ezetimibe (ZETIA) 10 MG tablet, Take 10 mg by mouth every evening., Disp: , Rfl:    furosemide (LASIX) 20 MG tablet, Take 20 mg by mouth daily as needed for fluid., Disp: , Rfl:    hydrALAZINE (APRESOLINE) 50 MG tablet, Take 50 mg by mouth 3 (three) times daily., Disp: , Rfl:    meloxicam (MOBIC) 15 MG tablet, Take 15 mg by mouth as needed for pain., Disp: , Rfl:    metoprolol succinate (TOPROL-XL) 100 MG 24 hr tablet, Take 100 mg by mouth daily., Disp: , Rfl:    nitroGLYCERIN (NITROSTAT) 0.4 MG SL tablet, Place 0.4 mg under the tongue every 5 (five) minutes as  needed for chest pain. Every 5 minutes, Disp: , Rfl:    Omega-3 Fatty Acids (FISH OIL) 1000 MG CAPS, Take 1 capsule by mouth every evening., Disp: , Rfl:    ondansetron (ZOFRAN) 8 MG tablet, Take 8 mg by mouth 3 (three) times daily., Disp: , Rfl:    pantoprazole (PROTONIX) 40 MG tablet, Take 40 mg by mouth at bedtime. , Disp: , Rfl:    Polyethyl Glycol-Propyl Glycol (SYSTANE  OP), Place 1 drop into both eyes daily as needed (dry eyes)., Disp: , Rfl:    potassium chloride (KLOR-CON) 10 MEQ tablet, Take 20 mEq by mouth 2 (two) times daily., Disp: , Rfl:    vitamin E 45 MG (100 UNITS) capsule, Take 100 Units by mouth daily., Disp: , Rfl:    Bempedoic Acid-Ezetimibe (NEXLIZET) 180-10 MG TABS, Take 1 tablet by mouth daily. (Patient not taking: Reported on 05/28/2021), Disp: 90 tablet, Rfl: 1   divalproex (DEPAKOTE) 250 MG DR tablet, Take 250 mg by mouth 2 (two) times daily. (Patient not taking: Reported on 06/11/2021), Disp: , Rfl:    Review of Systems  Constitutional:  Positive for chills, fatigue and fever. Negative for activity change, appetite change, diaphoresis and unexpected weight change.  HENT:  Negative for congestion, rhinorrhea, sinus pressure, sneezing, sore throat and trouble swallowing.   Eyes:  Negative for photophobia and visual disturbance.  Respiratory:  Negative for cough, chest tightness, shortness of breath, wheezing and stridor.   Cardiovascular:  Negative for chest pain, palpitations and leg swelling.  Gastrointestinal:  Negative for abdominal distention, abdominal pain, anal bleeding, blood in stool, constipation, diarrhea, nausea and vomiting.  Genitourinary:  Negative for difficulty urinating, dysuria, flank pain and hematuria.  Musculoskeletal:  Negative for arthralgias, back pain, gait problem, joint swelling and myalgias.  Skin:  Negative for color change, pallor, rash and wound.  Neurological:  Negative for dizziness, tremors, weakness and light-headedness.  Hematological:   Negative for adenopathy. Does not bruise/bleed easily.  Psychiatric/Behavioral:  Negative for agitation, behavioral problems, confusion, decreased concentration, dysphoric mood and sleep disturbance.        Objective:   Physical Exam Constitutional:      Appearance: He is well-developed.  HENT:     Head: Normocephalic and atraumatic.     Mouth/Throat:     Lips: Pink.     Mouth: Mucous membranes are moist.     Tongue: No lesions. Tongue does not deviate from midline.     Palate: No mass.     Pharynx: Oropharynx is clear. Uvula midline. No pharyngeal swelling, oropharyngeal exudate, posterior oropharyngeal erythema or uvula swelling.  Eyes:     Conjunctiva/sclera: Conjunctivae normal.  Cardiovascular:     Rate and Rhythm: Normal rate and regular rhythm.     Heart sounds: No murmur heard.    No friction rub. No gallop.  Pulmonary:     Effort: Pulmonary effort is normal. No respiratory distress.     Breath sounds: Normal breath sounds. No stridor. No wheezing or rhonchi.  Abdominal:     General: There is no distension.     Palpations: Abdomen is soft.  Musculoskeletal:        General: No tenderness. Normal range of motion.     Cervical back: Normal range of motion and neck supple.  Skin:    General: Skin is warm and dry.     Coloration: Skin is not pale.     Findings: No erythema or rash.  Neurological:     General: No focal deficit present.     Mental Status: He is alert and oriented to person, place, and time.  Psychiatric:        Mood and Affect: Mood normal.        Behavior: Behavior normal.        Thought Content: Thought content normal.        Judgment: Judgment normal.           Assessment &  Plan:   Fevers of unknown origin: I suspect he might have cyclic neutropenia but will need to prove that by getting a CBC with differential during acute febrile episode we will check a CBC with differential today smear and a CMP and we will try to send a familial fever  panel.    I spent  32  minutes with the patient including than 50% of the time in face to face counseling of the patient any work-up of FUO, personally reviewing CT chest abdomen pelvis [facility] along with review of medical records in preparation for the visit and during the visit and in coordination of his care.

## 2021-06-11 NOTE — Addendum Note (Signed)
Addended by: Harley Alto on: 06/11/2021 03:16 PM   Modules accepted: Orders

## 2021-06-14 LAB — COMPLETE METABOLIC PANEL WITH GFR
AG Ratio: 1.5 (calc) (ref 1.0–2.5)
ALT: 81 U/L — ABNORMAL HIGH (ref 9–46)
AST: 64 U/L — ABNORMAL HIGH (ref 10–40)
Albumin: 4.4 g/dL (ref 3.6–5.1)
Alkaline phosphatase (APISO): 84 U/L (ref 36–130)
BUN: 12 mg/dL (ref 7–25)
CO2: 27 mmol/L (ref 20–32)
Calcium: 9.5 mg/dL (ref 8.6–10.3)
Chloride: 105 mmol/L (ref 98–110)
Creat: 0.92 mg/dL (ref 0.60–1.29)
Globulin: 2.9 g/dL (calc) (ref 1.9–3.7)
Glucose, Bld: 71 mg/dL (ref 65–99)
Potassium: 3.5 mmol/L (ref 3.5–5.3)
Sodium: 139 mmol/L (ref 135–146)
Total Bilirubin: 0.4 mg/dL (ref 0.2–1.2)
Total Protein: 7.3 g/dL (ref 6.1–8.1)
eGFR: 102 mL/min/{1.73_m2} (ref >=60)

## 2021-06-14 LAB — PATHOLOGIST SMEAR REVIEW

## 2021-06-14 LAB — CBC WITH DIFFERENTIAL/PLATELET
Absolute Monocytes: 896 cells/uL (ref 200–950)
Basophils Absolute: 31 cells/uL (ref 0–200)
Basophils Relative: 0.3 %
Eosinophils Absolute: 124 cells/uL (ref 15–500)
Eosinophils Relative: 1.2 %
HCT: 43.3 % (ref 38.5–50.0)
Hemoglobin: 15 g/dL (ref 13.2–17.1)
Lymphs Abs: 2596 cells/uL (ref 850–3900)
MCH: 31.4 pg (ref 27.0–33.0)
MCHC: 34.6 g/dL (ref 32.0–36.0)
MCV: 90.8 fL (ref 80.0–100.0)
MPV: 10.5 fL (ref 7.5–12.5)
Monocytes Relative: 8.7 %
Neutro Abs: 6654 cells/uL (ref 1500–7800)
Neutrophils Relative %: 64.6 %
Platelets: 230 10*3/uL (ref 140–400)
RBC: 4.77 10*6/uL (ref 4.20–5.80)
RDW: 13.2 % (ref 11.0–15.0)
Total Lymphocyte: 25.2 %
WBC: 10.3 10*3/uL (ref 3.8–10.8)

## 2021-07-01 ENCOUNTER — Other Ambulatory Visit: Payer: Self-pay

## 2021-07-01 ENCOUNTER — Encounter: Payer: Self-pay | Admitting: Infectious Disease

## 2021-07-01 ENCOUNTER — Ambulatory Visit (INDEPENDENT_AMBULATORY_CARE_PROVIDER_SITE_OTHER): Payer: 59 | Admitting: Infectious Disease

## 2021-07-01 VITALS — BP 169/109 | HR 80 | Temp 97.7°F | Ht 69.0 in | Wt 255.0 lb

## 2021-07-01 DIAGNOSIS — K7581 Nonalcoholic steatohepatitis (NASH): Secondary | ICD-10-CM | POA: Diagnosis not present

## 2021-07-01 DIAGNOSIS — A689 Relapsing fever, unspecified: Secondary | ICD-10-CM

## 2021-07-01 HISTORY — DX: Nonalcoholic steatohepatitis (NASH): K75.81

## 2021-07-01 MED ORDER — COLCHICINE 0.6 MG PO CAPS: 1.2000 mg | ORAL_CAPSULE | Freq: Every day | ORAL | 4 refills | Status: DC

## 2021-07-01 NOTE — Progress Notes (Signed)
Subjective:  Chief complaint ongoing fevers    patient ID: Cameron Bryan, male    DOB: June 11, 1970, 51 y.o.   MRN: [id number]  HPI    51 y.o. male with history of recurrent monthly fevers who had bitten by ticks and was not getting better with doxycycline then given Keflex for potential cellulitis along with further doxycycline.  He was admitted to [facility] for work-up for FUO.  Labs here including CT chest abdomen and pelvis have been unremarkable to explain his fevers.  Visit he became afebrile was discharged.  I did have a suspicion that he could have cyclic neutropenia causing fevers and asked once he had a fever again to have him come to the clinic so CBC differential could be drawn he apparently had a fever this past weekend up over above 101.5 persisted for several days and accompanied by symptoms of profound fatigue and malaise.  He was feeling normal yesterday but now again feels weak and has had a low-grade temperature.  We attempted to have labs drawn through his primary care physician but they would not draw labs because he was having a fever and I suspect they had some concerns about potentially COVID.  We did do repeat CBC with differential and pathology smear but no neutropenia or abnormalities on smear were observed.  Am also endeavoring to see if we can do a familial fever panel.  He does not have much in his ancestry that he knows of to suggest familial Mediterranean fever.  He has more ancestry largely from [place] and [place].  Fortunately only 1 we could do is familial Mediterranean fever through [facility].  Since I last saw him he has begun to have fevers on nearly a daily basis sometimes up to 101 degrees.  He says he has body aches the only other abnormality is that he had an area of apparent bruising on his abdomen which has begun to clear centrally.  It does not look like an erythema migrans type rash.      Past Medical History:   Diagnosis Date   Abnormal ECG    Acute pain of left wrist 04/19/2019   Angina pectoris (HCC) 08/29/2019   Bipolar 1 disorder (HCC)    Blurred vision    Cephalalgia    Chest pain 06/05/2014   Chest tightness 07/23/2018   Chronic coronary artery disease 09/26/2019   Chronic dental pain 05/29/2021   Cigarette smoker 07/23/2018   Common migraine    Concussion without loss of consciousness 11/24/2015   COPD (chronic obstructive pulmonary disease) (HCC)    Depression    EKG abnormalities 06/05/2014   Encounter for orthopedic follow-up care 05/17/2019   Essential hypertension    Ganglion cyst of volar aspect of left wrist 05/06/2019   GERD (gastroesophageal reflux disease)    Headache    Hypertension    Hypokalemia 10/11/2020   Left knee pain 05/29/2021   Mixed dyslipidemia 12/04/2018   NASH (nonalcoholic steatohepatitis) 07/01/2021   Obesity (BMI 30-39.9) 10/11/2020   Pedal edema 07/23/2018   Post concussion syndrome 11/24/2015   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HCC)    Recurrent fever 05/29/2021   Schizoaffective disorder (HCC)    Statin intolerance 10/11/2020   Strain of peroneal tendon 04/13/2020   Stroke (HCC)    Tick bites 05/29/2021   Vision changes 06/05/2014    Past Surgical History:  Procedure Laterality Date   CORONARY STENT INTERVENTION N/A 09/16/2019   Procedure: CORONARY STENT INTERVENTION;  Surgeon:  Lyn Records, MD;  Location: [facility] INVASIVE CV LAB;  Service: Cardiovascular;  Laterality: N/A;  distal cfx    CYST REMOVAL HAND     LEFT HEART CATH AND CORONARY ANGIOGRAPHY N/A 09/16/2019   Procedure: LEFT HEART CATH AND CORONARY ANGIOGRAPHY;  Surgeon: Lyn Records, MD;  Location: [facility] INVASIVE CV LAB;  Service: Cardiovascular;  Laterality: N/A;    Family History  Problem Relation Age of Onset   Alzheimer's disease Paternal Grandfather       Social History   Socioeconomic History   Marital status: Married    Spouse name: Not on file   Number of children: Not on file    Years of education: Not on file   Highest education level: Not on file  Occupational History   Not on file  Tobacco Use   Smoking status: Every Day    Packs/day: 0.50    Types: Cigarettes   Smokeless tobacco: Never  Substance and Sexual Activity   Alcohol use: Yes    Alcohol/week: 12.0 standard drinks of alcohol    Types: 12 Standard drinks or equivalent per week    Comment: occ   Drug use: No   Sexual activity: Not on file  Other Topics Concern   Not on file  Social History Narrative   Not on file   Social Determinants of Health   Financial Resource Strain: Not on file  Food Insecurity: Not on file  Transportation Needs: Not on file  Physical Activity: Not on file  Stress: Not on file  Social Connections: Not on file    Allergies  Allergen Reactions   Prednisone Shortness Of Breath    Mental reaction Other reaction(s): Vasculitis     Atorvastatin     Memory problem   Chantix [Varenicline Tartrate]     Loss of migraine and vision   Oxycodone Itching    Itching   Triamcinolone Acetonide Other (See Comments)   Valtrex [Valacyclovir Hcl]     Auditory hallucinations/ voices     Current Outpatient Medications:    ALPRAZolam (XANAX) 1 MG tablet, Take 1 mg by mouth 3 (three) times daily., Disp: , Rfl:    amLODipine (NORVASC) 10 MG tablet, Take 10 mg by mouth at bedtime. , Disp: , Rfl:    aspirin EC 81 MG tablet, Take 1 tablet (81 mg total) by mouth daily. Swallow whole., Disp: 90 tablet, Rfl: 2   clopidogrel (PLAVIX) 75 MG tablet, TAKE 1 TABLET BY MOUTH  DAILY WITH BREAKFAST, Disp: 90 tablet, Rfl: 3   ezetimibe (ZETIA) 10 MG tablet, Take 10 mg by mouth every evening., Disp: , Rfl:    furosemide (LASIX) 20 MG tablet, Take 20 mg by mouth daily as needed for fluid., Disp: , Rfl:    hydrALAZINE (APRESOLINE) 50 MG tablet, Take 50 mg by mouth 3 (three) times daily., Disp: , Rfl:    meloxicam (MOBIC) 15 MG tablet, Take 15 mg by mouth as needed for pain., Disp: , Rfl:     metoprolol succinate (TOPROL-XL) 100 MG 24 hr tablet, Take 100 mg by mouth daily., Disp: , Rfl:    Omega-3 Fatty Acids (FISH OIL) 1000 MG CAPS, Take 1 capsule by mouth every evening., Disp: , Rfl:    ondansetron (ZOFRAN) 8 MG tablet, Take 8 mg by mouth 3 (three) times daily., Disp: , Rfl:    pantoprazole (PROTONIX) 40 MG tablet, Take 40 mg by mouth at bedtime. , Disp: , Rfl:    Polyethyl Glycol-Propyl Glycol (  SYSTANE OP), Place 1 drop into both eyes daily as needed (dry eyes)., Disp: , Rfl:    potassium chloride (KLOR-CON) 10 MEQ tablet, Take 20 mEq by mouth 2 (two) times daily., Disp: , Rfl:    vitamin E 45 MG (100 UNITS) capsule, Take 100 Units by mouth daily., Disp: , Rfl:    Bempedoic Acid-Ezetimibe (NEXLIZET) 180-10 MG TABS, Take 1 tablet by mouth daily. (Patient not taking: Reported on 05/28/2021), Disp: 90 tablet, Rfl: 1   divalproex (DEPAKOTE) 250 MG DR tablet, Take 250 mg by mouth 2 (two) times daily. (Patient not taking: Reported on 06/11/2021), Disp: , Rfl:    EDARBI 40 MG TABS, Take 40 mg by mouth daily., Disp: , Rfl:    ELDERBERRY PO, Take 1 tablet by mouth daily., Disp: , Rfl:    EPINEPHrine 0.3 mg/0.3 mL IJ SOAJ injection, Inject 0.3 mg into the muscle as needed for anaphylaxis., Disp: , Rfl:    nitroGLYCERIN (NITROSTAT) 0.4 MG SL tablet, Place 0.4 mg under the tongue every 5 (five) minutes as needed for chest pain. Every 5 minutes (Patient not taking: Reported on 07/01/2021), Disp: , Rfl:    Review of Systems  Constitutional:  Positive for diaphoresis, fatigue and fever. Negative for chills.  HENT:  Negative for congestion and sore throat.   Eyes:  Negative for photophobia.  Respiratory:  Negative for cough, shortness of breath and wheezing.   Cardiovascular:  Negative for chest pain, palpitations and leg swelling.  Gastrointestinal:  Negative for abdominal pain, blood in stool, constipation, diarrhea, nausea and vomiting.  Genitourinary:  Negative for dysuria, flank pain and  hematuria.  Musculoskeletal:  Positive for myalgias. Negative for back pain.  Skin:  Positive for rash.  Neurological:  Negative for dizziness, weakness and headaches.  Hematological:  Does not bruise/bleed easily.  Psychiatric/Behavioral:  Negative for suicidal ideas.        Objective:   Physical Exam Constitutional:      Appearance: He is well-developed.  HENT:     Head: Normocephalic and atraumatic.  Eyes:     Conjunctiva/sclera: Conjunctivae normal.  Cardiovascular:     Rate and Rhythm: Normal rate and regular rhythm.  Pulmonary:     Effort: Pulmonary effort is normal. No respiratory distress.     Breath sounds: No wheezing.  Abdominal:     General: There is no distension.     Palpations: Abdomen is soft.  Musculoskeletal:        General: No tenderness. Normal range of motion.     Cervical back: Normal range of motion and neck supple.  Skin:    General: Skin is warm and dry.     Coloration: Skin is not pale.     Findings: No erythema or rash.  Neurological:     General: No focal deficit present.     Mental Status: He is alert and oriented to person, place, and time.  Psychiatric:        Mood and Affect: Mood normal.        Behavior: Behavior normal.        Thought Content: Thought content normal.        Judgment: Judgment normal.   Rash 07/01/2021:           Assessment & Plan:   Fevers unknown origin: Has had extensive work-up and I cannot find a specific source for his fevers.  It may be that he has a hereditary fever syndrome we will send off FMF labs as  well as repeat CBC with differential today I will trial him on colchicine at 1.2 mg a day to see if this makes a difference and have him return to clinic to see me in August  Evaded liver function test likely due to NASH

## 2021-07-12 ENCOUNTER — Telehealth: Payer: Self-pay

## 2021-07-12 NOTE — Telephone Encounter (Signed)
Patient's wife called (with patient on speaker phone). Stated patient had fever of 104F today, took two 200 mg ibuprofen with no effect. Advised patient to go to the ER, but after retaking temperature during call it was 98.47F.  Patient and wife requesting advice on whether to continue colchicine. Patient states he aches worse now than before he began taking the prescription. States pain is in joints and fingers, and he has difficulty ambulating. Rates pain as 10/10.   Also states he has a new bruise on R shin x1 week, as well as a "knot".  Routed to provider and clinical pharmacists. Please advise.  Wyvonne Lenz, RN

## 2021-07-13 NOTE — Telephone Encounter (Signed)
Called patient and his wife to relay provider advice, no answer. Left HIPAA compliant voicemail requesting callback on each number.   Will send MyChart message.   Sandie Ano, RN

## 2021-07-14 ENCOUNTER — Ambulatory Visit (INDEPENDENT_AMBULATORY_CARE_PROVIDER_SITE_OTHER): Payer: 59 | Admitting: Internal Medicine

## 2021-07-14 ENCOUNTER — Other Ambulatory Visit: Payer: Self-pay

## 2021-07-14 ENCOUNTER — Encounter: Payer: Self-pay | Admitting: Internal Medicine

## 2021-07-14 VITALS — BP 169/115 | HR 82 | Temp 97.9°F | Ht 69.0 in | Wt 258.0 lb

## 2021-07-14 DIAGNOSIS — A689 Relapsing fever, unspecified: Secondary | ICD-10-CM | POA: Diagnosis not present

## 2021-07-14 NOTE — Progress Notes (Signed)
[facility]  CHIEF COMPLAINT:    Follow up for FUO  SUBJECTIVE:    Cameron Bryan is a 51 y.o. male with PMHx as below who presents to the clinic for FUO.   This patient is followed by Dr Daiva Eves whom he first saw as an inpatient in May 2023 and for outpatient follow up on 6/20 and 6/29.  Patients history is detailed in those notes.  Work up has been unremarkable.  At his visit on 6/29, CBC was normal.  Dr Daiva Eves sent off FMF mutation analysis labs but this is still pending.  He was also started on empiric colchicine.  On 7/10 his wife called and said he had a fever of 104 that improved to 98.2 with a couple ibuprofen.  Reports that he ached worse now than before he began taking the prescription of colchicine. He has had no documented fevers including his admission in May.  Presents today and reports that he continues to have fevers.  He showed me his home thermometer and the readings ranging from 101-103 degrees.  They are happening throughout the day.  THey asked if he could have a UTI but I suggested that is not likely after several months and no urinary symptoms.  He reports feeling more fatigued than before starting colchicine and having joint aches.  He is anxious to resume his daily activities of being outdoors.  He also states he is not sweating like he used to and that his forehead "feels like powder".    Please see A&P for the details of today's visit and status of the patient's medical problems.   Patient's Medications  New Prescriptions   No medications on file  Previous Medications   ALPRAZOLAM (XANAX) 1 MG TABLET    Take 1 mg by mouth 3 (three) times daily.   AMLODIPINE (NORVASC) 10 MG TABLET    Take 10 mg by mouth at bedtime.    ASPIRIN EC 81 MG TABLET    Take 1 tablet (81 mg total) by mouth daily. Swallow whole.   BEMPEDOIC ACID-EZETIMIBE (NEXLIZET) 180-10 MG TABS    Take 1 tablet by mouth daily.   CLOPIDOGREL (PLAVIX) 75 MG TABLET     TAKE 1 TABLET BY MOUTH  DAILY WITH BREAKFAST   COLCHICINE 0.6 MG CAPS    Take 1.2 mg by mouth daily.   DIVALPROEX (DEPAKOTE) 250 MG DR TABLET    Take 250 mg by mouth 2 (two) times daily.   EDARBI 40 MG TABS    Take 40 mg by mouth daily.   ELDERBERRY PO    Take 1 tablet by mouth daily.   EPINEPHRINE 0.3 MG/0.3 ML IJ SOAJ INJECTION    Inject 0.3 mg into the muscle as needed for anaphylaxis.   EZETIMIBE (ZETIA) 10 MG TABLET    Take 10 mg by mouth every evening.   FUROSEMIDE (LASIX) 20 MG TABLET    Take 20 mg by mouth daily as needed for fluid.   HYDRALAZINE (APRESOLINE) 50 MG TABLET    Take 50 mg by mouth 3 (three) times daily.   MELOXICAM (MOBIC) 15 MG TABLET    Take 15 mg by mouth as needed for pain.   METOPROLOL SUCCINATE (TOPROL-XL) 100 MG 24 HR TABLET    Take 100 mg by mouth daily.   NITROGLYCERIN (NITROSTAT) 0.4 MG SL TABLET    Place 0.4 mg under the tongue every 5 (five) minutes as  needed for chest pain. Every 5 minutes   OMEGA-3 FATTY ACIDS (FISH OIL) 1000 MG CAPS    Take 1 capsule by mouth every evening.   ONDANSETRON (ZOFRAN) 8 MG TABLET    Take 8 mg by mouth 3 (three) times daily.   PANTOPRAZOLE (PROTONIX) 40 MG TABLET    Take 40 mg by mouth at bedtime.    POLYETHYL GLYCOL-PROPYL GLYCOL (SYSTANE OP)    Place 1 drop into both eyes daily as needed (dry eyes).   POTASSIUM CHLORIDE (KLOR-CON) 10 MEQ TABLET    Take 20 mEq by mouth 2 (two) times daily.   VITAMIN E 45 MG (100 UNITS) CAPSULE    Take 100 Units by mouth daily.  Modified Medications   No medications on file  Discontinued Medications   No medications on file      Past Medical History:  Diagnosis Date   Abnormal ECG    Acute pain of left wrist 04/19/2019   Angina pectoris (HCC) 08/29/2019   Bipolar 1 disorder (HCC)    Blurred vision    Cephalalgia    Chest pain 06/05/2014   Chest tightness 07/23/2018   Chronic coronary artery disease 09/26/2019   Chronic dental pain 05/29/2021   Cigarette smoker 07/23/2018   Common  migraine    Concussion without loss of consciousness 11/24/2015   COPD (chronic obstructive pulmonary disease) (HCC)    Depression    EKG abnormalities 06/05/2014   Encounter for orthopedic follow-up care 05/17/2019   Essential hypertension    Ganglion cyst of volar aspect of left wrist 05/06/2019   GERD (gastroesophageal reflux disease)    Headache    Hypertension    Hypokalemia 10/11/2020   Left knee pain 05/29/2021   Mixed dyslipidemia 12/04/2018   NASH (nonalcoholic steatohepatitis) 07/01/2021   Obesity (BMI 30-39.9) 10/11/2020   Pedal edema 07/23/2018   Post concussion syndrome 11/24/2015   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HCC)    Recurrent fever 05/29/2021   Schizoaffective disorder (HCC)    Statin intolerance 10/11/2020   Strain of peroneal tendon 04/13/2020   Stroke (HCC)    Tick bites 05/29/2021   Vision changes 06/05/2014    Social History   Tobacco Use   Smoking status: Every Day    Packs/day: 0.50    Types: Cigarettes   Smokeless tobacco: Never  Substance Use Topics   Alcohol use: Yes    Alcohol/week: 12.0 standard drinks of alcohol    Types: 12 Standard drinks or equivalent per week    Comment: occ   Drug use: No    Family History  Problem Relation Age of Onset   Alzheimer's disease Paternal Grandfather     Allergies  Allergen Reactions   Prednisone Shortness Of Breath    Mental reaction Other reaction(s): Vasculitis     Atorvastatin     Memory problem   Chantix [Varenicline Tartrate]     Loss of migraine and vision   Oxycodone Itching    Itching   Triamcinolone Acetonide Other (See Comments)   Valtrex [Valacyclovir Hcl]     Auditory hallucinations/ voices    Review of Systems  All other systems reviewed and are negative.  Except as noted above in HPI.   OBJECTIVE:    There were no vitals filed for this visit. There is no height or weight on file to calculate BMI.  Physical Exam Constitutional:      General: He is not in acute distress.     Appearance: Normal appearance.  HENT:  Head: Normocephalic and atraumatic.  Eyes:     Extraocular Movements: Extraocular movements intact.     Conjunctiva/sclera: Conjunctivae normal.  Pulmonary:     Effort: Pulmonary effort is normal. No respiratory distress.  Abdominal:     General: There is no distension.     Palpations: Abdomen is soft.  Musculoskeletal:     Cervical back: Normal range of motion and neck supple.  Skin:    General: Skin is warm and dry.     Coloration: Skin is not jaundiced.     Findings: No rash.  Neurological:     General: No focal deficit present.     Mental Status: He is alert and oriented to person, place, and time.  Psychiatric:        Mood and Affect: Mood normal.        Behavior: Behavior normal.      Labs and Microbiology:    Latest Ref Rng & Units 07/01/2021   10:26 AM 06/11/2021    3:14 PM 05/30/2021   12:26 AM  CBC  WBC 3.8 - 10.8 Thousand/uL 9.7  10.3  8.1   Hemoglobin 13.2 - 17.1 g/dL 32.6  71.2  45.8   Hematocrit 38.5 - 50.0 % 46.1  43.3  41.0   Platelets 140 - 400 Thousand/uL 218  230  179       Latest Ref Rng & Units 06/11/2021    3:14 PM 05/30/2021   12:26 AM 05/29/2021    6:16 AM  CMP  Glucose 65 - 99 mg/dL 71  099  833   BUN 7 - 25 mg/dL 12  16  13    Creatinine 0.60 - 1.29 mg/dL  8.25  0.53   Sodium 135 - 146 mmol/L 139  142  138   Potassium 3.5 - 5.3 mmol/L 3.5  3.2  3.4   Chloride 98 - 110 mmol/L 105  106  103   CO2 20 - 32 mmol/L 27  28  25    Calcium 8.6 - 10.3 mg/dL 9.5  8.9  8.6   Total Protein 6.1 - 8.1 g/dL 7.3  6.4  6.6   Total Bilirubin 0.2 - 1.2 mg/dL 0.4  0.4  0.4   Alkaline Phos 38 - 126 U/L  71  75   AST 10 - 40 U/L 64  52  53   ALT 9 - 46 U/L 81  73  78        ASSESSMENT & PLAN:    1. Recurrent fever   Discussed with patient that I am not sure what is going on with his fevers.  He is again afebrile here.  I suggested he obtain an oral thermometer as he has been using a temporal one at home to see  if this makes a difference as he is otherwise very well appearing.  I advised him to hold off on continuing colchicine as this is making him feel worse while we wait on the FMF lab to result.  I also encouraged him to resume his typical activities and go about his normal routine as much as possible.  He has follow up next month with Dr 9.76 to discuss further.  Perhaps a rheumatology evaluation could be considered in the future given that his ID work up has been unrevealing if fevers continue to persist.     for Infectious Disease [facility] 07/14/2021, 4:37 AM  I have personally spent 30 minutes involved  in face-to-face and non-face-to-face activities for this patient on the day of the visit. Professional time spent includes the following activities: Preparing to see the patient (review of tests), Obtaining and/or reviewing separately obtained history (admission/discharge record), Performing a medically appropriate examination and/or evaluation , Ordering medications/tests/procedures, referring and communicating with other health care professionals, Documenting clinical information in the EMR, Independently interpreting results (not separately reported), Communicating results to the patient/family/caregiver, Counseling and educating the patient/family/caregiver and Care coordination (not separately reported).

## 2021-07-14 NOTE — Patient Instructions (Signed)
Stop colchicine  Await labs  Get oral thermometer  Follow up with Dr Daiva Eves

## 2021-07-17 LAB — CBC WITH DIFFERENTIAL/PLATELET
Absolute Monocytes: 873 cells/uL (ref 200–950)
Basophils Absolute: 29 cells/uL (ref 0–200)
Basophils Relative: 0.3 %
Eosinophils Absolute: 68 cells/uL (ref 15–500)
Eosinophils Relative: 0.7 %
HCT: 46.1 % (ref 38.5–50.0)
Hemoglobin: 16 g/dL (ref 13.2–17.1)
Lymphs Abs: 1843 cells/uL (ref 850–3900)
MCH: 31.4 pg (ref 27.0–33.0)
MCHC: 34.7 g/dL (ref 32.0–36.0)
MCV: 90.6 fL (ref 80.0–100.0)
MPV: 10.8 fL (ref 7.5–12.5)
Monocytes Relative: 9 %
Neutro Abs: 6887 cells/uL (ref 1500–7800)
Neutrophils Relative %: 71 %
Platelets: 218 10*3/uL (ref 140–400)
RBC: 5.09 10*6/uL (ref 4.20–5.80)
RDW: 12.8 % (ref 11.0–15.0)
Total Lymphocyte: 19 %
WBC: 9.7 10*3/uL (ref 3.8–10.8)

## 2021-07-17 LAB — FAMILIAL MEDITERRANEAN FEVER MUTATION ANALYSIS

## 2021-07-19 ENCOUNTER — Other Ambulatory Visit: Payer: Self-pay

## 2021-07-19 MED ORDER — NITROGLYCERIN 0.4 MG SL SUBL: 0.4000 mg | SUBLINGUAL_TABLET | SUBLINGUAL | 3 refills | Status: AC | PRN

## 2021-07-20 ENCOUNTER — Telehealth: Payer: Self-pay | Admitting: Cardiology

## 2021-07-20 NOTE — Telephone Encounter (Signed)
Pt c/o medication issue:  1. Name of Medication: Bempedoic Acid-Ezetimibe (NEXLIZET) 180-10 MG TABS  2. How are you currently taking this medication (dosage and times per day)? Take 1 tablet by mouth daily.Patient not taking: Reported on 05/28/2021  3. Are you having a reaction (difficulty breathing--STAT)? No   4. What is your medication issue? Needs a prior authorization

## 2021-07-20 NOTE — Telephone Encounter (Signed)
PA completed and waiting on answer.

## 2021-07-21 NOTE — Telephone Encounter (Signed)
PA approved for Nexlizet through 01/02/22.

## 2021-08-26 ENCOUNTER — Ambulatory Visit (INDEPENDENT_AMBULATORY_CARE_PROVIDER_SITE_OTHER): Payer: 59 | Admitting: Infectious Disease

## 2021-08-26 ENCOUNTER — Encounter: Payer: Self-pay | Admitting: Infectious Disease

## 2021-08-26 ENCOUNTER — Other Ambulatory Visit: Payer: Self-pay

## 2021-08-26 VITALS — BP 141/90 | HR 92 | Temp 97.8°F | Ht 69.0 in | Wt 252.0 lb

## 2021-08-26 DIAGNOSIS — R509 Fever, unspecified: Secondary | ICD-10-CM | POA: Diagnosis not present

## 2021-08-26 DIAGNOSIS — M79602 Pain in left arm: Secondary | ICD-10-CM

## 2021-08-26 DIAGNOSIS — M791 Myalgia, unspecified site: Secondary | ICD-10-CM

## 2021-08-26 DIAGNOSIS — R9431 Abnormal electrocardiogram [ECG] [EKG]: Secondary | ICD-10-CM

## 2021-08-26 DIAGNOSIS — K7581 Nonalcoholic steatohepatitis (NASH): Secondary | ICD-10-CM

## 2021-08-26 DIAGNOSIS — F319 Bipolar disorder, unspecified: Secondary | ICD-10-CM

## 2021-08-26 DIAGNOSIS — A689 Relapsing fever, unspecified: Secondary | ICD-10-CM

## 2021-08-26 NOTE — Progress Notes (Signed)
Subjective:  Chief complaint still having fevers and having some myalgias as well and fatigue  Patient ID: Cameron Bryan, male    DOB: Feb 04, 1970, 51 y.o. y.o.   MRN: [id number]  HPI  Is a 51 year old Caucasian man with a past medical history significant for COPD coronary artery disease depression who was admitted to the hospital in May for work-up of a fever unknown origin.  He actually is afebrile during his hospital stay and his work-up including CTs of the chest abdomen pelvis blood cultures Panorex plain films of the knee rheumatological labs were all unremarkable.  He had an extensive work-up as mentioned but still was having fevers which she had at home times as high as 104 degrees based on the pictures that he is showing me.  Interestingly in our clinic he has never had a fever.  I placed him on on colchicine in case he had a familial Mediterranean fever or other inherited periodic fever syndrome.  The FMF labs came back negative.  I tried to do more labs for other familial periodic fever syndromes such as high burning in fever but those labs were not available to [place] to request.  We placed him on colchicine to see if this would help but it did not help with his fevers and it did not help with his overall symptoms in fact made him feel worse.  He came back and saw Dr. Earlene Plater who recommended the patient get an oral thermometer he is has an oral thermometer he says the oral thermometer and the other one he is using do correlate with 1 other though he really is using the former thermometer more commonly since it displays the temperature in a way that he can take a picture of it.  He really does not have much in the way of focal symptoms he does have myalgias in his arms and legs but no tenderness on exam and no effusions or joint abnormalities he has some pain in his left biceps where he has had surgery before and had recently injured himself.  However the pain there is only present when he does  things with his hands or arm and is not present at rest.    Past Medical History:  Diagnosis Date   Abnormal ECG    Acute pain of left wrist 04/19/2019   Angina pectoris (HCC) 08/29/2019   Bipolar 1 disorder (HCC)    Blurred vision    Cephalalgia    Chest pain 06/05/2014   Chest tightness 07/23/2018   Chronic coronary artery disease 09/26/2019   Chronic dental pain 05/29/2021   Cigarette smoker 07/23/2018   Common migraine    Concussion without loss of consciousness 11/24/2015   COPD (chronic obstructive pulmonary disease) (HCC)    Depression    EKG abnormalities 06/05/2014   Encounter for orthopedic follow-up care 05/17/2019   Essential hypertension    Ganglion cyst of volar aspect of left wrist 05/06/2019   GERD (gastroesophageal reflux disease)    Headache    Hypertension    Hypokalemia 10/11/2020   Left knee pain 05/29/2021   Mixed dyslipidemia 12/04/2018   NASH (nonalcoholic steatohepatitis) 07/01/2021   Obesity (BMI 30-39.9) 10/11/2020   Pedal edema 07/23/2018   Post concussion syndrome 11/24/2015   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HCC)    Recurrent fever 05/29/2021   Schizoaffective disorder (HCC)    Statin intolerance 10/11/2020   Strain of peroneal tendon 04/13/2020   Stroke (HCC)    Tick bites 05/29/2021   Vision  changes 06/05/2014    Past Surgical History:  Procedure Laterality Date   CORONARY STENT INTERVENTION N/A 09/16/2019   Procedure: CORONARY STENT INTERVENTION;  Surgeon: Lyn Records, MD;  Location: [facility];  Service: Cardiovascular;  Laterality: N/A;  distal cfx    CYST REMOVAL HAND     LEFT HEART CATH AND CORONARY ANGIOGRAPHY N/A 09/16/2019   Procedure: LEFT HEART CATH AND CORONARY ANGIOGRAPHY;  Surgeon: Lyn Records, MD;  Location: [facility];  Service: Cardiovascular;  Laterality: N/A;    Family History  Problem Relation Age of Onset   Alzheimer's disease Paternal Grandfather       Social History   Socioeconomic History    Marital status: Married    Spouse name: Not on file   Number of children: Not on file   Years of education: Not on file   Highest education level: Not on file  Occupational History   Not on file  Tobacco Use   Smoking status: Every Day    Packs/day: 0.10    Types: Cigarettes   Smokeless tobacco: Never   Tobacco comments:    States he smokes very irregularly - working on quitting  Substance and Sexual Activity   Alcohol use: Yes    Alcohol/week: 12.0 standard drinks of alcohol    Types: 12 Standard drinks or equivalent per week    Comment: occasional   Drug use: No   Sexual activity: Not on file  Other Topics Concern   Not on file  Social History Narrative   Not on file   Social Determinants of Health   Financial Resource Strain: Not on file  Food Insecurity: Not on file  Transportation Needs: Not on file  Physical Activity: Not on file  Stress: Not on file  Social Connections: Not on file    Allergies  Allergen Reactions   Prednisone Shortness Of Breath    Mental reaction Other reaction(s): Vasculitis     Atorvastatin     Memory problem   Chantix [Varenicline Tartrate]     Loss of migraine and vision   Oxycodone Itching    Itching   Triamcinolone Acetonide Other (See Comments)   Valtrex [Valacyclovir Hcl]     Auditory hallucinations/ voices     Current Outpatient Medications:    ALPRAZolam (XANAX) 1 MG tablet, Take 1 mg by mouth 3 (three) times daily., Disp: , Rfl:    amLODipine (NORVASC) 10 MG tablet, Take 10 mg by mouth at bedtime. , Disp: , Rfl:    aspirin EC 81 MG tablet, Take 1 tablet (81 mg total) by mouth daily. Swallow whole., Disp: 90 tablet, Rfl: 2   Bempedoic Acid-Ezetimibe (NEXLIZET) 180-10 MG TABS, Take 1 tablet by mouth daily. (Patient not taking: Reported on 05/28/2021), Disp: 90 tablet, Rfl: 1   clopidogrel (PLAVIX) 75 MG tablet, TAKE 1 TABLET BY MOUTH  DAILY WITH BREAKFAST, Disp: 90 tablet, Rfl: 3   divalproex (DEPAKOTE) 250 MG DR tablet,  Take 250 mg by mouth 2 (two) times daily. (Patient not taking: Reported on 06/11/2021), Disp: , Rfl:    EDARBI 40 MG TABS, Take 40 mg by mouth daily., Disp: , Rfl:    ELDERBERRY PO, Take 1 tablet by mouth daily., Disp: , Rfl:    EPINEPHrine 0.3 mg/0.3 mL IJ SOAJ injection, Inject 0.3 mg into the muscle as needed for anaphylaxis. (Patient not taking: Reported on 07/14/2021), Disp: , Rfl:    ezetimibe (ZETIA) 10 MG tablet, Take 10 mg  by mouth every evening., Disp: , Rfl:    furosemide (LASIX) 20 MG tablet, Take 20 mg by mouth daily as needed for fluid., Disp: , Rfl:    hydrALAZINE (APRESOLINE) 50 MG tablet, Take 50 mg by mouth 3 (three) times daily., Disp: , Rfl:    meloxicam (MOBIC) 15 MG tablet, Take 15 mg by mouth as needed for pain., Disp: , Rfl:    metoprolol succinate (TOPROL-XL) 100 MG 24 hr tablet, Take 100 mg by mouth daily., Disp: , Rfl:    nitroGLYCERIN (NITROSTAT) 0.4 MG SL tablet, Place 1 tablet (0.4 mg total) under the tongue every 5 (five) minutes as needed for chest pain. Every 5 minutes, Disp: 25 tablet, Rfl: 3   Omega-3 Fatty Acids (FISH OIL) 1000 MG CAPS, Take 1 capsule by mouth every evening., Disp: , Rfl:    ondansetron (ZOFRAN) 8 MG tablet, Take 8 mg by mouth 3 (three) times daily., Disp: , Rfl:    pantoprazole (PROTONIX) 40 MG tablet, Take 40 mg by mouth at bedtime. , Disp: , Rfl:    Polyethyl Glycol-Propyl Glycol (SYSTANE OP), Place 1 drop into both eyes daily as needed (dry eyes)., Disp: , Rfl:    potassium chloride (KLOR-CON) 10 MEQ tablet, Take 20 mEq by mouth 2 (two) times daily., Disp: , Rfl:    vitamin E 45 MG (100 UNITS) capsule, Take 100 Units by mouth daily., Disp: , Rfl:    Review of Systems  Constitutional:  Positive for fatigue and fever. Negative for activity change, appetite change, chills, diaphoresis and unexpected weight change.  HENT:  Negative for congestion, rhinorrhea, sinus pressure, sneezing, sore throat and trouble swallowing.   Eyes:  Negative for  photophobia and visual disturbance.  Respiratory:  Negative for cough, chest tightness, shortness of breath, wheezing and stridor.   Cardiovascular:  Negative for chest pain, palpitations and leg swelling.  Gastrointestinal:  Negative for abdominal distention, abdominal pain, anal bleeding, blood in stool, constipation, diarrhea, nausea and vomiting.  Genitourinary:  Negative for difficulty urinating, dysuria, flank pain and hematuria.  Musculoskeletal:  Positive for myalgias. Negative for arthralgias, back pain, gait problem and joint swelling.  Skin:  Negative for color change, pallor, rash and wound.  Neurological:  Negative for dizziness, tremors, weakness and light-headedness.  Hematological:  Negative for adenopathy. Does not bruise/bleed easily.  Psychiatric/Behavioral:  Negative for agitation, behavioral problems, confusion, decreased concentration, dysphoric mood and sleep disturbance.        Objective:   Physical Exam Constitutional:      Appearance: He is well-developed.  HENT:     Head: Normocephalic and atraumatic.  Eyes:     Conjunctiva/sclera: Conjunctivae normal.  Cardiovascular:     Rate and Rhythm: Normal rate and regular rhythm.  Pulmonary:     Effort: Pulmonary effort is normal. No respiratory distress.     Breath sounds: No wheezing.  Abdominal:     General: There is no distension.     Palpations: Abdomen is soft.  Musculoskeletal:        General: No swelling, tenderness, deformity or signs of injury. Normal range of motion.     Cervical back: Normal range of motion and neck supple.     Right lower leg: No edema.     Left lower leg: No edema.  Skin:    General: Skin is warm and dry.     Coloration: Skin is not pale.     Findings: No erythema or rash.  Neurological:  General: No focal deficit present.     Mental Status: He is alert and oriented to person, place, and time.  Psychiatric:        Mood and Affect: Mood normal.        Behavior: Behavior  normal.        Thought Content: Thought content normal.        Judgment: Judgment normal.           Assessment & Plan:   Fever of Unknown origin:  We have done a fairly exhaustive work-up.  I do see that I have not ordered a cryoglobulin panel which I am doing today I also had not ordered an SPEP which I will do I will check a serum ferritin angiotensin-converting enzyme level repeat sed rate CRP CMP CBC with differential and CPK.  I will also refer him to rheumatology though I am skeptical that they may find something  Certainly some patients with fatigue have low-grade temperatures but not typically the temperature he is showing [place] in the pictures from his home thermometer.   I spent 31 minutes with the patient including than 50% of the time in face to face counseling of the patient and his wife regarding the nature of fevers unknown origin, along with review of medical records in preparation for the visit and during the visit and in coordination of his care.

## 2021-08-30 ENCOUNTER — Telehealth: Payer: Self-pay

## 2021-08-30 NOTE — Telephone Encounter (Signed)
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 I would like him to take prednisone 40mg  a day and see Rheumatology ----- Message ----- From: Interface, [facility] Results In Sent: 08/26/2021   3:52 PM EDT To: 08/28/2021, MD

## 2021-08-30 NOTE — Telephone Encounter (Signed)
Called patient to relay message below. No answer left voice message. 

## 2021-08-31 ENCOUNTER — Other Ambulatory Visit: Payer: Self-pay | Admitting: Infectious Disease

## 2021-08-31 MED ORDER — PREDNISONE 20 MG PO TABS: 40.0000 mg | ORAL_TABLET | Freq: Every day | ORAL | 1 refills | Status: AC

## 2021-08-31 NOTE — Telephone Encounter (Addendum)
Received the following message from Dr. Daiva Eves:   "Daiva Eves, Lisette Grinder, MD  Annaliyah Willig, Ellender Hose, RN Isabellarose Kope if he can start the prednisone that would be the best esp now that I see her LFTS are MUCH worse. IS he drinking alcohol or other hepatotoxins?  If so he should stop that immediately.  He should also have his CMP repeated probably best if he gets seen by myself or one of my partners within the week after he has been on steroids to recheck his LFTs"  Spoke with Lorin Picket, asked him to disregard previous MyChart message and recommended he start the prednisone as his LFTs are markedly elevated. He says he has been doing better at cutting back on alcohol. Says he is planning on drinking some today and then will refrain. Asked him to refrain from Tylenol use and alcohol.  He is taking a "Move Free" joint supplement, it does not appear to contain acetaminophen.   He will see Dr. Elinor Parkinson next week to recheck CMP after being on prednisone x 1 week. Patient verbalized understanding and has no further questions.   Sandie Ano, RN

## 2021-08-31 NOTE — Telephone Encounter (Signed)
Called patient to discuss referral to rheumatology and need for prednisone, no answer. Left HIPAA compliant voicemail requesting callback.   Sandie Ano, RN

## 2021-08-31 NOTE — Telephone Encounter (Signed)
Patient returned call, we discussed that he is being referred to rheumatology for further evaluation for adult onset Stills disease.   He would like to avoid prednisone if possible as he says it makes him feel funny. If Dr. Daiva Eves would still like him to take it, he would like it sent to [facility] in [place].   Sandie Ano, RN

## 2021-09-02 LAB — CBC WITH DIFFERENTIAL/PLATELET
Absolute Monocytes: 783 cells/uL (ref 200–950)
Basophils Absolute: 30 cells/uL (ref 0–200)
Basophils Relative: 0.4 %
Eosinophils Absolute: 38 cells/uL (ref 15–500)
Eosinophils Relative: 0.5 %
HCT: 45.1 % (ref 38.5–50.0)
Hemoglobin: 15.6 g/dL (ref 13.2–17.1)
Lymphs Abs: 1018 cells/uL (ref 850–3900)
MCH: 31.1 pg (ref 27.0–33.0)
MCHC: 34.6 g/dL (ref 32.0–36.0)
MCV: 89.8 fL (ref 80.0–100.0)
MPV: 9.7 fL (ref 7.5–12.5)
Monocytes Relative: 10.3 %
Neutro Abs: 5730 cells/uL (ref 1500–7800)
Neutrophils Relative %: 75.4 %
Platelets: 323 10*3/uL (ref 140–400)
RBC: 5.02 10*6/uL (ref 4.20–5.80)
RDW: 12.5 % (ref 11.0–15.0)
Total Lymphocyte: 13.4 %
WBC: 7.6 10*3/uL (ref 3.8–10.8)

## 2021-09-02 LAB — PROTEIN ELECTROPHORESIS, SERUM
Albumin ELP: 4.3 g/dL (ref 3.8–4.8)
Alpha 1: 0.4 g/dL — ABNORMAL HIGH (ref 0.2–0.3)
Alpha 2: 0.8 g/dL (ref 0.5–0.9)
Beta 2: 0.4 g/dL (ref 0.2–0.5)
Beta Globulin: 0.5 g/dL (ref 0.4–0.6)
Gamma Globulin: 1.3 g/dL (ref 0.8–1.7)
Total Protein: 7.6 g/dL (ref 6.1–8.1)

## 2021-09-02 LAB — CRYOGLOBULIN: Cryoglobulin, Qualitative Analysis: NOT DETECTED

## 2021-09-02 LAB — COMPLETE METABOLIC PANEL WITH GFR
AG Ratio: 1.4 (calc) (ref 1.0–2.5)
ALT: 262 U/L — ABNORMAL HIGH (ref 9–46)
AST: 268 U/L — ABNORMAL HIGH (ref 10–40)
Albumin: 4.4 g/dL (ref 3.6–5.1)
Alkaline phosphatase (APISO): 82 U/L (ref 36–130)
BUN: 18 mg/dL (ref 7–25)
CO2: 28 mmol/L (ref 20–32)
Calcium: 10.1 mg/dL (ref 8.6–10.3)
Chloride: 94 mmol/L — ABNORMAL LOW (ref 98–110)
Creat: 1.19 mg/dL (ref 0.60–1.29)
Globulin: 3.2 g/dL (calc) (ref 1.9–3.7)
Glucose, Bld: 136 mg/dL — ABNORMAL HIGH (ref 65–99)
Potassium: 3.3 mmol/L — ABNORMAL LOW (ref 3.5–5.3)
Sodium: 136 mmol/L (ref 135–146)
Total Bilirubin: 0.4 mg/dL (ref 0.2–1.2)
Total Protein: 7.6 g/dL (ref 6.1–8.1)
eGFR: 75 mL/min/{1.73_m2} (ref >=60)

## 2021-09-02 LAB — C-REACTIVE PROTEIN: CRP: 10.5 mg/L — ABNORMAL HIGH (ref <8.0)

## 2021-09-02 LAB — FERRITIN: Ferritin: 997 ng/mL — ABNORMAL HIGH (ref 38–380)

## 2021-09-02 LAB — ANGIOTENSIN CONVERTING ENZYME: Angiotensin-Converting Enzyme: 62 U/L (ref 9–67)

## 2021-09-02 LAB — SEDIMENTATION RATE: Sed Rate: 33 mm/h — ABNORMAL HIGH (ref 0–15)

## 2021-09-02 LAB — CK: Total CK: 103 U/L (ref 44–196)

## 2021-09-02 NOTE — Telephone Encounter (Signed)
Spoke with Lorin Picket and advised him to follow up with Rheumatology and keep appointment with [place] next week. He says he is going to "tough it out" with the prednisone. He took one of his prescribed xanax and states this helped the feelings he had earlier this morning to subside.   Sandie Ano, RN

## 2021-09-02 NOTE — Telephone Encounter (Signed)
Cameron Bryan called, says he took his prednisone yesterday and today. He reports that he feels "crazy" and like his eyes are the "size of softballs" and can "feel things crawling out of them."  He does report that his bones feel better, but he would like to stop taking the prednisone. Will route to provider.   Sandie Ano, RN

## 2021-09-08 ENCOUNTER — Ambulatory Visit (INDEPENDENT_AMBULATORY_CARE_PROVIDER_SITE_OTHER): Payer: 59 | Admitting: Infectious Diseases

## 2021-09-08 ENCOUNTER — Other Ambulatory Visit: Payer: Self-pay

## 2021-09-08 ENCOUNTER — Encounter: Payer: Self-pay | Admitting: Infectious Diseases

## 2021-09-08 VITALS — BP 149/91 | HR 75 | Temp 98.0°F | Ht 69.0 in | Wt 255.0 lb

## 2021-09-08 DIAGNOSIS — Z789 Other specified health status: Secondary | ICD-10-CM | POA: Diagnosis not present

## 2021-09-08 DIAGNOSIS — R7401 Elevation of levels of liver transaminase levels: Secondary | ICD-10-CM | POA: Diagnosis not present

## 2021-09-08 DIAGNOSIS — A689 Relapsing fever, unspecified: Secondary | ICD-10-CM | POA: Diagnosis not present

## 2021-09-08 NOTE — Progress Notes (Addendum)
Patient Active Problem List   Diagnosis Date Noted   NASH (nonalcoholic steatohepatitis) 07/01/2021   Chronic dental pain 05/29/2021   Recurrent fever 05/29/2021   Left knee pain 05/29/2021   Tick bites 05/29/2021   Fever    FUO (fever of unknown origin) 05/28/2021   Posterior cervical adenopathy 05/28/2021   Elevated LFTs 05/28/2021   Coronary artery disease 05/03/2021   Obesity (BMI 30-39.9) 10/11/2020   Statin intolerance 10/11/2020   Stroke (HCC)    Chronic coronary artery disease 09/26/2019   Common migraine    COPD (chronic obstructive pulmonary disease) (HCC)    Depression    GERD (gastroesophageal reflux disease)    Angina pectoris (HCC) 08/29/2019   Ganglion cyst of volar aspect of left wrist 05/06/2019   Mixed dyslipidemia 12/04/2018   Cigarette smoker 07/23/2018   Pedal edema 07/23/2018   Schizoaffective disorder (HCC)    Benign essential HTN    Bipolar 1 disorder (HCC)     Patient's Medications  New Prescriptions   No medications on file  Previous Medications   ALPRAZOLAM (XANAX) 1 MG TABLET    Take 1 mg by mouth 3 (three) times daily.   AMLODIPINE (NORVASC) 10 MG TABLET    Take 10 mg by mouth at bedtime.    ASPIRIN EC 81 MG TABLET    Take 1 tablet (81 mg total) by mouth daily. Swallow whole.   BEMPEDOIC ACID-EZETIMIBE (NEXLIZET) 180-10 MG TABS    Take 1 tablet by mouth daily.   CLOPIDOGREL (PLAVIX) 75 MG TABLET    TAKE 1 TABLET BY MOUTH  DAILY WITH BREAKFAST   DIVALPROEX (DEPAKOTE) 250 MG DR TABLET    Take 250 mg by mouth 2 (two) times daily.   EDARBI 40 MG TABS    Take 40 mg by mouth daily.   ELDERBERRY PO    Take 1 tablet by mouth daily.   EPINEPHRINE 0.3 MG/0.3 ML IJ SOAJ INJECTION    Inject 0.3 mg into the muscle as needed for anaphylaxis.   EZETIMIBE (ZETIA) 10 MG TABLET    Take 10 mg by mouth every evening.   FUROSEMIDE (LASIX) 20 MG TABLET    Take 20 mg by mouth daily as needed for fluid.   HYDRALAZINE (APRESOLINE) 50 MG TABLET    Take 50 mg by  mouth 3 (three) times daily.   MELOXICAM (MOBIC) 15 MG TABLET    Take 15 mg by mouth as needed for pain.   METOPROLOL SUCCINATE (TOPROL-XL) 100 MG 24 HR TABLET    Take 100 mg by mouth daily.   NITROGLYCERIN (NITROSTAT) 0.4 MG SL TABLET    Place 1 tablet (0.4 mg total) under the tongue every 5 (five) minutes as needed for chest pain. Every 5 minutes   OMEGA-3 FATTY ACIDS (FISH OIL) 1000 MG CAPS    Take 1 capsule by mouth every evening.   ONDANSETRON (ZOFRAN) 8 MG TABLET    Take 8 mg by mouth 3 (three) times daily.   PANTOPRAZOLE (PROTONIX) 40 MG TABLET    Take 40 mg by mouth at bedtime.    POLYETHYL GLYCOL-PROPYL GLYCOL (SYSTANE OP)    Place 1 drop into both eyes daily as needed (dry eyes).   POTASSIUM CHLORIDE (KLOR-CON) 10 MEQ TABLET    Take 20 mEq by mouth 2 (two) times daily.   VITAMIN E 45 MG (100 UNITS) CAPSULE    Take 100 Units by mouth daily.  Modified Medications   No medications on  file  Discontinued Medications   No medications on file    Subjective: 51 Y O male with PMH of Anxiety/Depression/schizoaffective d/o/Bipolar d/o, GERD, Bipolar d/o, CAD, Smoker, COPD, HTN, Dyslipidemia, NASH, CVA, Obesity who is here for follow up in the context of recurrent fever/fever of unknown origin. Patient has been followed by my partner Dr Daiva Eves since end of May 2023 and also seen by Dr Earlene Plater in July with extensive work up done with unclear cause for infective symptoms. I have reviewed their prior notes. He was last seen by Dr Daiva Eves on 08/26/21 cryoglobulins, SPEP, ferritin, ACE, ESR, CRP, CBC, CMP and CPK. He was also referred to Rheumatology which has not been completed yet.   He was also started on Prednisone 40mg  po daily for 7 day given elevated ferritin and elevated liver enzymes for concerns for still's disease. He although was initially hesitant to take prednisone, has complete prednisone as instructed. He denies any fevers since last seen on 8/24.He also did not have myalgia and  arthralgia that used to come with febrile episodes.   He has mild headache sometimes but no hearing of visual changes, neck pain sometimes. Denies chest pain, cough and SOB. Denies nausea, vomiting, abdominal pain and diarrhea. Denies GU symptoms and rashes. He has stopped smoking for last 1 week. He had drink beer heavily before his last blood work which showed elevated liver enzymes and says that his PCP had told him his liver enzymes shoot up when he drinks heavily. He has been disabled since 2015 after h/o fall. 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 He has 2 dogs at home. Denies recently travel out of state or country. He has born and lived in [place] although has briefly lived in [place].  He denies any symptoms whatsoever today.   Review of Systems: all systems reviewed with pertinent positives and negatives as listed above  Past Medical History:  Diagnosis Date   Abnormal ECG    Acute pain of left wrist 04/19/2019   Angina pectoris (HCC) 08/29/2019   Bipolar 1 disorder (HCC)    Blurred vision    Cephalalgia    Chest pain 06/05/2014   Chest tightness 07/23/2018   Chronic coronary artery disease 09/26/2019   Chronic dental pain 05/29/2021   Cigarette smoker 07/23/2018   Common migraine    Concussion without loss of consciousness 11/24/2015   COPD (chronic obstructive pulmonary disease) (HCC)    Depression    EKG abnormalities 06/05/2014   Encounter for orthopedic follow-up care 05/17/2019   Essential hypertension    Ganglion cyst of volar aspect of left wrist 05/06/2019   GERD (gastroesophageal reflux disease)    Headache    Hypertension    Hypokalemia 10/11/2020   Left knee pain 05/29/2021   Mixed dyslipidemia 12/04/2018   NASH (nonalcoholic steatohepatitis) 07/01/2021   Obesity (BMI 30-39.9) 10/11/2020   Pedal edema 07/23/2018   Post concussion syndrome 11/24/2015   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HCC)    Recurrent fever  05/29/2021   Schizoaffective disorder (HCC)    Statin intolerance 10/11/2020   Strain of peroneal tendon 04/13/2020   Stroke (HCC)    Tick bites 05/29/2021   Vision changes 06/05/2014   Past Surgical History:  Procedure Laterality Date   CORONARY STENT INTERVENTION N/A 09/16/2019   Procedure: CORONARY STENT INTERVENTION;  Surgeon: Lyn Records, MD;  Location: [facility];  Service: Cardiovascular;  Laterality: N/A;  distal cfx    CYST REMOVAL HAND     LEFT HEART CATH AND CORONARY ANGIOGRAPHY N/A 09/16/2019   Procedure: LEFT HEART CATH AND CORONARY ANGIOGRAPHY;  Surgeon: Lyn Records, MD;  Location: [facility];  Service: Cardiovascular;  Laterality: N/A;    Social History   Tobacco Use   Smoking status: Every Day    Packs/day: 0.10    Types: Cigarettes   Smokeless tobacco: Never   Tobacco comments:    States he smokes very irregularly - working on quitting  Substance Use Topics   Alcohol use: Yes    Alcohol/week: 12.0 standard drinks of alcohol    Types: 12 Standard drinks or equivalent per week    Comment: occasional   Drug use: No    Family History  Problem Relation Age of Onset   Alzheimer's disease Paternal Grandfather     Allergies  Allergen Reactions   Prednisone Shortness Of Breath    Mental reaction Other reaction(s): Vasculitis     Atorvastatin     Memory problem   Chantix [Varenicline Tartrate]     Loss of migraine and vision   Oxycodone Itching    Itching   Triamcinolone Acetonide Other (See Comments)   Valtrex [Valacyclovir Hcl]     Auditory hallucinations/ voices    Health Maintenance  Topic Date Due   COVID-19 Vaccine (1) Never done   TETANUS/TDAP  Never done   COLONOSCOPY (Pts 45-6yrs Insurance coverage will need to be confirmed)  Never done   INFLUENZA VACCINE  08/03/2021   Hepatitis C Screening  Completed   HIV Screening  Completed   HPV VACCINES  Aged Out    Objective:  Vitals:   09/08/21 1012  BP: (!) 149/91   Pulse: 75  Temp: 98 F (36.7 C)  TempSrc: Oral  Weight: 255 lb (115.7 kg)  Height: 5\' 9"  (1.753 m)   Body mass index is 37.66 kg/m.  Physical Exam Constitutional:      Appearance: Normal appearance.  Obese  HENT:     Head: Normocephalic and atraumatic.      Mouth: Mucous membranes are moist.  Eyes:    Conjunctiva/sclera: Conjunctivae normal.     Pupils:   Cardiovascular:     Rate and Rhythm: Normal rate and regular rhythm.     Heart sounds:   Pulmonary:     Effort: Pulmonary effort is normal.     Breath sounds: Normal breath sounds.   Abdominal:     General: Non distended     Palpations: soft.   Musculoskeletal:        General: Normal range of motion.   Skin:    General: Skin is warm and dry.     Comments: no rashes   Neurological:     General: grossly non focal     Mental Status: awake, alert and oriented to person, place, and time.   Psychiatric:        Mood and Affect: Mood normal.   Lab Results Lab Results  Component Value Date   WBC 7.6 08/26/2021   HGB 15.6 08/26/2021   HCT 45.1 08/26/2021   MCV 89.8 08/26/2021   PLT 323 08/26/2021    Lab Results  Component Value Date   CREATININE 1.19 08/26/2021   BUN 18 08/26/2021   NA 136 08/26/2021   K 3.3 (L) 08/26/2021   CL 94 (L) 08/26/2021   CO2 28 08/26/2021    Lab Results  Component Value  Date   ALT 262 (H) 08/26/2021   AST 268 (H) 08/26/2021   ALKPHOS 71 05/30/2021   BILITOT 0.4 08/26/2021    Lab Results  Component Value Date   CHOL 186 07/18/2018   HDL 40 07/18/2018   LDLCALC 108 (H) 07/18/2018   TRIG 191 (H) 07/18/2018   CHOLHDL 4.7 07/18/2018   No results found for: "LABRPR", "RPRTITER" No results found for: "HIV1RNAQUANT", "HIV1RNAVL", "CD4TABS"   Microbiology Results for orders placed or performed during the hospital encounter of 05/28/21  Culture, blood (Routine X 2) w Reflex to ID Panel     Status: None   Collection Time: 05/28/21  6:46 PM   Specimen: BLOOD  Result Value  Ref Range Status   Specimen Description BLOOD SITE NOT SPECIFIED  Final   Special Requests   Final    BOTTLES DRAWN AEROBIC AND ANAEROBIC Blood Culture results may not be optimal due to an inadequate volume of blood received in culture bottles   Culture   Final    NO GROWTH 5 DAYS Performed at [facility], [address], [place], [place] [address]    Report Status 06/02/2021 FINAL  Final  Culture, blood (Routine X 2) w Reflex to ID Panel     Status: None   Collection Time: 05/28/21  9:35 PM   Specimen: BLOOD  Result Value Ref Range Status   Specimen Description BLOOD RIGHT ANTECUBITAL  Final   Special Requests   Final    BOTTLES DRAWN AEROBIC AND ANAEROBIC Blood Culture adequate volume   Culture   Final    NO GROWTH 5 DAYS Performed at [facility], [address] [place], [place] [address]    Report Status 06/02/2021 FINAL  Final  SARS Coronavirus 2 by RT PCR (hospital order, performed in [facility] hospital lab) *cepheid single result test* Anterior Nasal Swab     Status: None   Collection Time: 05/28/21 10:24 PM   Specimen: Anterior Nasal Swab  Result Value Ref Range Status   SARS Coronavirus 2 by RT PCR NEGATIVE NEGATIVE Final    Comment: Performed at [facility], [address], [place], [place] [address]   Imaging No results found.  Assessment/Plan # Recurrent fevers/fever of unknown origin 5/26 blood cx 2/2 sets NG 05/28/21 acute hepatitis panel and HIV NR  5/26 RMSF IgM and IgG negative  5/26 SARScov2 negative 5/27 Quantiferon negative 5/27 anti MPO , anti PR#, c-ANCA, p-ANCA negative, SPEP negative, RF negative, ANA negative, LDH wnl 5/27 EBV and CMV serology s/o past exposure 6/29 FMF mutation analysis negative, path smear unremarkable  8/24 cryoglobulins negative, CK normal , SPE normal, ferritin elevated at 997, ACE normal  5/27 CT CAP unremarkable  5/27 TTE unremarkable    He has had inpatient as well as out patient evaluation by ID  since end of May 2023 with exhaustive negative work up for ID etiologies. Of note, he appears well otherwise on exam. He does not have any immunocompromising conditions or on immunocompromising medications. Although he has been afebrile since taking prednisone, 1 week is not long enough to assess if fevers are improved due to prednisone or resolved by itself. He is hesitant to continue prednisone at this time as he had great trouble completing the 7 days course itself. It is reasonable to hold off prednisone currently given no clear auto immune cause. He will follow up with Rheumatology to see if he needs additional work up for auto immune  causes or periodic fevers syndromes and need for prolonged steroids. Recheck ESR and CRP. Encouraged to keep fever diary.   # Transaminitis - likely due to recent binge alcohol intake with known h/o alcoholic hepatitis. Will check EBV and CMV PCR for thoroughness as well as RMSF and Ehrlichia serology given prior h/o tick bite Repeat hepatic function panel. Counseled to avoid excessive alcohol intake   # Ca screening - I have advised him to follow up with his PCP for age based ca screening for completeness   I have personally spent 55  minutes involved in face-to-face and non-face-to-face activities for this patient on the day of the visit. Professional time spent includes the following activities: Preparing to see the patient (review of tests), Obtaining and/or reviewing separately obtained history (admission/discharge record), Performing a medically appropriate examination and/or evaluation , Ordering medications/tests/procedures, referring and communicating with other health care professionals, Documenting clinical information in the EMR, Independently interpreting results (not separately reported), Communicating results to the patient/family/caregiver, Counseling and educating the patient/family/caregiver and Care coordination (not separately reported).   Victoriano Lain, MD [facility] 09/08/2021, 10:44 AM

## 2021-09-11 LAB — HEPATIC FUNCTION PANEL
AG Ratio: 1.4 (calc) (ref 1.0–2.5)
ALT: 73 U/L — ABNORMAL HIGH (ref 9–46)
AST: 49 U/L — ABNORMAL HIGH (ref 10–40)
Albumin: 4.2 g/dL (ref 3.6–5.1)
Alkaline phosphatase (APISO): 67 U/L (ref 36–130)
Bilirubin, Direct: 0.1 mg/dL (ref 0.0–0.2)
Globulin: 2.9 g/dL (calc) (ref 1.9–3.7)
Indirect Bilirubin: 0.3 mg/dL (calc) (ref 0.2–1.2)
Total Bilirubin: 0.4 mg/dL (ref 0.2–1.2)
Total Protein: 7.1 g/dL (ref 6.1–8.1)

## 2021-09-11 LAB — EHRLICHIA ANTIBODY PANEL
E. CHAFFEENSIS AB IGG: <1:64 {titer}
E. CHAFFEENSIS AB IGM: <1:20 {titer}

## 2021-09-11 LAB — C-REACTIVE PROTEIN: CRP: 2.2 mg/L (ref <8.0)

## 2021-09-11 LAB — CMV (CYTOMEGALOVIRUS) DNA ULTRAQUANT, PCR
CMV DNA, QN PCR: NOT DETECTED log IU/mL
CMV DNA, QN Real Time PCR: NOT DETECTED IU/mL

## 2021-09-11 LAB — ROCKY MTN SPOTTED FVR ABS PNL(IGG+IGM)
RMSF IgG: NOT DETECTED
RMSF IgM: NOT DETECTED

## 2021-09-11 LAB — EPSTEIN BARR VIRUS DNA, QUANT RTPCR
EBV DNA, QN PCR: NOT DETECTED Log cps/mL
EBV DNA, QN PCR: NOT DETECTED copies/mL

## 2021-09-11 LAB — SEDIMENTATION RATE: Sed Rate: 6 mm/h (ref 0–15)

## 2021-09-12 DIAGNOSIS — Z789 Other specified health status: Secondary | ICD-10-CM | POA: Insufficient documentation

## 2021-09-12 DIAGNOSIS — F1090 Alcohol use, unspecified, uncomplicated: Secondary | ICD-10-CM | POA: Insufficient documentation

## 2021-09-12 DIAGNOSIS — R7401 Elevation of levels of liver transaminase levels: Secondary | ICD-10-CM | POA: Insufficient documentation

## 2021-09-12 HISTORY — DX: Elevation of levels of liver transaminase levels: R74.01

## 2021-09-12 HISTORY — DX: Alcohol use, unspecified, uncomplicated: F10.90

## 2021-09-12 HISTORY — DX: Other specified health status: Z78.9

## 2021-09-13 ENCOUNTER — Telehealth: Payer: Self-pay

## 2021-09-13 NOTE — Telephone Encounter (Signed)
Left voicemail asking patient to return my call.   Cameron Bryan, CMA  

## 2021-09-13 NOTE — Telephone Encounter (Signed)
-----   Message from Odette Fraction, MD sent at 09/13/2021  7:30 AM EDT ----- Please let him know all of the labs I ordered were unremarkable Liver enzymes have also come down and likely alcohol related.

## 2021-09-15 ENCOUNTER — Inpatient Hospital Stay: Payer: Self-pay | Admitting: Infectious Disease

## 2021-09-16 NOTE — Telephone Encounter (Signed)
Staff have been unable to reach patient on multiple attempts. Patient currently has not read mychart message at this time. Patient has an upcoming follow up appointment which result will be relayed at that time.    Cameron Bryan

## 2021-09-20 ENCOUNTER — Telehealth: Payer: Self-pay

## 2021-09-20 NOTE — Telephone Encounter (Signed)
Received voicemail from [facility] scheduling with message requesting new diagnosis code for image order. Provided R74.01. This code was not sufficient. Routing to provider for additional advise.   Cameron Bryan  R93.5 R10.9

## 2021-09-21 ENCOUNTER — Other Ambulatory Visit: Payer: Self-pay | Admitting: Cardiology

## 2021-09-21 NOTE — Telephone Encounter (Signed)
I provided this code already and it was not sufficient.

## 2021-09-21 NOTE — Telephone Encounter (Signed)
Dr. West Bali suggests using ICD-10 code K72.0. Staff called centralized scheduling and provided new dx. Code for imaging. This code successfully went through. Cameron Bryan

## 2021-09-22 NOTE — Telephone Encounter (Signed)
Refill sent to pharmacy.   

## 2021-09-27 ENCOUNTER — Telehealth: Payer: Self-pay

## 2021-09-27 NOTE — Telephone Encounter (Signed)
Received call from [facility] at [place]. Requested last two sets of all lab results for patient's referral. Results from 09/08/21 and 08/26/21 faxed to [phone] and successful receipt confirmed electronically.  Binnie Kand, RN

## 2021-10-05 ENCOUNTER — Inpatient Hospital Stay: Payer: 59 | Admitting: Infectious Disease

## 2021-10-20 ENCOUNTER — Telehealth: Payer: Self-pay

## 2021-10-20 NOTE — Telephone Encounter (Signed)
Patient called to cancel tomorrow's appointment, says he is "tired of going to doctors" and that he is a "mystery."   Advised him our office would call him if provider would like him to reschedule and come back in.   Beryle Flock, RN

## 2021-10-21 ENCOUNTER — Other Ambulatory Visit: Payer: Self-pay

## 2021-10-21 ENCOUNTER — Inpatient Hospital Stay: Payer: 59 | Admitting: Infectious Disease

## 2021-10-21 MED ORDER — NEXLIZET 180-10 MG PO TABS: 1.0000 | ORAL_TABLET | Freq: Every day | ORAL | 1 refills | Status: DC

## 2021-10-26 ENCOUNTER — Ambulatory Visit: Payer: 59 | Admitting: Infectious Disease

## 2021-10-27 LAB — QUANTIFERON-TB GOLD PLUS: QuantiFERON-TB Gold Plus: NEGATIVE

## 2021-11-15 ENCOUNTER — Ambulatory Visit: Payer: 59 | Admitting: Cardiology

## 2021-11-17 ENCOUNTER — Other Ambulatory Visit: Payer: Self-pay

## 2021-11-17 DIAGNOSIS — M79609 Pain in unspecified limb: Secondary | ICD-10-CM

## 2021-11-17 DIAGNOSIS — Z6837 Body mass index (BMI) 37.0-37.9, adult: Secondary | ICD-10-CM

## 2021-11-17 DIAGNOSIS — E669 Obesity, unspecified: Secondary | ICD-10-CM

## 2021-11-17 DIAGNOSIS — R5383 Other fatigue: Secondary | ICD-10-CM

## 2021-11-17 DIAGNOSIS — M7061 Trochanteric bursitis, right hip: Secondary | ICD-10-CM

## 2021-11-17 DIAGNOSIS — M7062 Trochanteric bursitis, left hip: Secondary | ICD-10-CM

## 2021-11-17 DIAGNOSIS — R7989 Other specified abnormal findings of blood chemistry: Secondary | ICD-10-CM

## 2021-11-17 DIAGNOSIS — M791 Myalgia, unspecified site: Secondary | ICD-10-CM

## 2021-11-17 DIAGNOSIS — R809 Proteinuria, unspecified: Secondary | ICD-10-CM

## 2021-11-17 DIAGNOSIS — M1991 Primary osteoarthritis, unspecified site: Secondary | ICD-10-CM

## 2021-11-17 HISTORY — DX: Obesity, unspecified: E66.9

## 2021-11-17 HISTORY — DX: Myalgia, unspecified site: M79.10

## 2021-11-17 HISTORY — DX: Pain in unspecified limb: M79.609

## 2021-11-17 HISTORY — DX: Proteinuria, unspecified: R80.9

## 2021-11-17 HISTORY — DX: Primary osteoarthritis, unspecified site: M19.91

## 2021-11-17 HISTORY — DX: Trochanteric bursitis, left hip: M70.62

## 2021-11-17 HISTORY — DX: Body mass index (BMI) 37.0-37.9, adult: Z68.37

## 2021-11-17 HISTORY — DX: Trochanteric bursitis, right hip: M70.61

## 2021-11-17 HISTORY — DX: Other fatigue: R53.83

## 2021-11-17 HISTORY — DX: Other specified abnormal findings of blood chemistry: R79.89

## 2021-11-18 ENCOUNTER — Ambulatory Visit: Payer: 59 | Admitting: Cardiology

## 2021-12-21 ENCOUNTER — Other Ambulatory Visit: Payer: Self-pay | Admitting: Cardiology

## 2021-12-21 NOTE — Telephone Encounter (Signed)
Rx refill sent to pharmacy. 

## 2022-01-07 ENCOUNTER — Telehealth: Payer: Self-pay

## 2022-01-07 NOTE — Telephone Encounter (Signed)
PA approved for Nexlizet 180-10 mg from 01/03/22 until 01/03/23.

## 2022-01-10 ENCOUNTER — Other Ambulatory Visit: Payer: Self-pay

## 2022-01-13 ENCOUNTER — Encounter: Payer: Self-pay | Admitting: Cardiology

## 2022-01-13 ENCOUNTER — Ambulatory Visit: Payer: 59 | Attending: Cardiology | Admitting: Cardiology

## 2022-01-13 VITALS — BP 144/86 | HR 80 | Ht 69.0 in | Wt 245.6 lb

## 2022-01-13 DIAGNOSIS — E669 Obesity, unspecified: Secondary | ICD-10-CM

## 2022-01-13 DIAGNOSIS — I2510 Atherosclerotic heart disease of native coronary artery without angina pectoris: Secondary | ICD-10-CM

## 2022-01-13 DIAGNOSIS — E782 Mixed hyperlipidemia: Secondary | ICD-10-CM | POA: Diagnosis not present

## 2022-01-13 DIAGNOSIS — F17210 Nicotine dependence, cigarettes, uncomplicated: Secondary | ICD-10-CM | POA: Diagnosis not present

## 2022-01-13 DIAGNOSIS — I10 Essential (primary) hypertension: Secondary | ICD-10-CM

## 2022-01-13 NOTE — Progress Notes (Signed)
Cardiology Office Note:    Date:  01/13/2022   ID:  Cameron Bryan, DOB [birth year], MRN [id number]  PCP:  Marylen Ponto, MD  Cardiologist:  Garwin Brothers, MD   Referring MD: Marylen Ponto, MD    ASSESSMENT:    1. Coronary artery disease involving native coronary artery of native heart without angina pectoris   2. Benign essential HTN   3. Obesity (BMI 30-39.9)   4. Mixed dyslipidemia   5. Cigarette smoker    PLAN:    In order of problems listed above:  Coronary artery disease: Secondary prevention stressed with the patient.  Importance of compliance with diet medication stressed any vocalized understanding.  He was advised to walk at least half an hour a day 5 days a week and he promises to do so. Essential hypertension: Blood pressure stable and diet was emphasized.  Lifestyle modification urged. Mixed dyslipidemia: On lipid-lowering medications.  I reviewed lipid numbers and discussed this with him. Obesity: Weight reduction stressed and diet was emphasized.  [place] obesity explained and he promises to do better. Cigarette smoker: I spent 5 minutes with the patient discussing solely about smoking. Smoking cessation was counseled. I suggested to the patient also different medications and pharmacological interventions. Patient is keen to try stopping on its own at this time. He will get back to me if he needs any further assistance in this matter. Patient will be seen in follow-up appointment in 6 months or earlier if the patient has any concerns    Medication Adjustments/Labs and Tests Ordered: Current medicines are reviewed at length with the patient today.  Concerns regarding medicines are outlined above.  No orders of the defined types were placed in this encounter.  No orders of the defined types were placed in this encounter.    No chief complaint on file.    History of Present Illness:    Cameron Bryan is a 52 y.o. male.  Patient has past medical history of  coronary artery disease, essential hypertension, dyslipidemia and unfortunately continues to smoke.  He denies any chest pain orthopnea or PND.  He mentions to me that he tries to stay active on a regular basis exercise exam.  At the time of my evaluation, the patient is alert awake oriented and in no distress.  Past Medical History:  Diagnosis Date   Adult-onset obesity 11/17/2021   Alcohol use 09/12/2021   Angina pectoris (HCC) 08/29/2019   Benign essential HTN    Bipolar 1 disorder (HCC)    Body mass index (BMI) 37.0-37.9, adult 11/17/2021   Chronic coronary artery disease 09/26/2019   Chronic dental pain 05/29/2021   Cigarette smoker 07/23/2018   Common migraine    COPD (chronic obstructive pulmonary disease) (HCC)    Coronary artery disease 05/03/2021   Depression    Elevated ferritin 11/17/2021   Elevated LFTs 05/28/2021   Fatigue 11/17/2021   Fever    FUO (fever of unknown origin) 05/28/2021   Ganglion cyst of volar aspect of left wrist 05/06/2019   GERD (gastroesophageal reflux disease)    Hypertension    Left knee pain 05/29/2021   Mixed dyslipidemia 12/04/2018   Myalgia 11/17/2021   NASH (nonalcoholic steatohepatitis) 07/01/2021   Obesity (BMI 30-39.9) 10/11/2020   Pain in limb 11/17/2021   Pedal edema 07/23/2018   Posterior cervical adenopathy 05/28/2021   Primary osteoarthritis 11/17/2021   Proteinuria 11/17/2021   Recurrent fever 05/29/2021   Schizoaffective disorder (HCC)    Statin  intolerance 10/11/2020   Stroke ([place])    Tick bites 05/29/2021   Transaminitis 09/12/2021   Trochanteric bursitis of left hip 11/17/2021   Trochanteric bursitis of right hip 11/17/2021    Past Surgical History:  Procedure Laterality Date   CORONARY STENT INTERVENTION N/A 09/16/2019   Procedure: CORONARY STENT INTERVENTION;  Surgeon: Belva Crome, MD;  Location: [facility];  Service: Cardiovascular;  Laterality: N/A;  distal cfx    CYST REMOVAL HAND     LEFT HEART  CATH AND CORONARY ANGIOGRAPHY N/A 09/16/2019   Procedure: LEFT HEART CATH AND CORONARY ANGIOGRAPHY;  Surgeon: Belva Crome, MD;  Location: [facility];  Service: Cardiovascular;  Laterality: N/A;    Current Medications: Current Meds  Medication Sig   ALPRAZolam (XANAX) 1 MG tablet Take 1 mg by mouth 3 (three) times daily.   amLODipine (NORVASC) 10 MG tablet Take 10 mg by mouth at bedtime.    ARIPiprazole (ABILIFY) 5 MG tablet Take 5 mg by mouth daily.   aspirin EC 81 MG tablet Take 1 tablet (81 mg total) by mouth daily. Swallow whole.   Bempedoic Acid-Ezetimibe (NEXLIZET) 180-10 MG TABS Take 1 tablet by mouth daily.   clopidogrel (PLAVIX) 75 MG tablet Take 1 tablet (75 mg total) by mouth daily with breakfast.   EDARBI 40 MG TABS Take 40 mg by mouth daily.   EDARBYCLOR 40-25 MG TABS Take 1 tablet by mouth daily.   ELDERBERRY PO Take 1 tablet by mouth daily.   EPINEPHrine 0.3 mg/0.3 mL IJ SOAJ injection Inject 0.3 mg into the muscle as needed for anaphylaxis.   ezetimibe (ZETIA) 10 MG tablet Take 10 mg by mouth every evening.   furosemide (LASIX) 20 MG tablet Take 20 mg by mouth daily as needed for fluid.   hydrALAZINE (APRESOLINE) 50 MG tablet Take 50 mg by mouth 3 (three) times daily.   meloxicam (MOBIC) 15 MG tablet Take 15 mg by mouth as needed for pain.   metoprolol succinate (TOPROL-XL) 100 MG 24 hr tablet Take 100 mg by mouth daily.   nitroGLYCERIN (NITROSTAT) 0.4 MG SL tablet Place 1 tablet (0.4 mg total) under the tongue every 5 (five) minutes as needed for chest pain. Every 5 minutes   Omega-3 Fatty Acids (FISH OIL) 1000 MG CAPS Take 1 capsule by mouth every evening.   ondansetron (ZOFRAN) 8 MG tablet Take 8 mg by mouth 3 (three) times daily.   pantoprazole (PROTONIX) 40 MG tablet Take 40 mg by mouth at bedtime.    Polyethyl Glycol-Propyl Glycol (SYSTANE OP) Place 1 drop into both eyes daily as needed (dry eyes).   potassium chloride (KLOR-CON) 10 MEQ tablet Take 20 mEq by  mouth 2 (two) times daily.   vitamin E 45 MG (100 UNITS) capsule Take 100 Units by mouth daily.     Allergies:   Prednisone, Atorvastatin, Chantix [varenicline tartrate], Oxycodone, Oxycodone hcl, Triamcinolone acetonide, and Valtrex [valacyclovir hcl]   Social History   Socioeconomic History   Marital status: Married    Spouse name: Not on file   Number of children: Not on file   Years of education: Not on file   Highest education level: Not on file  Occupational History   Not on file  Tobacco Use   Smoking status: Every Day    Packs/day: 0.10    Types: Cigarettes   Smokeless tobacco: Never   Tobacco comments:    States he smokes very irregularly - working on quitting  Substance and Sexual Activity   Alcohol use: Yes    Alcohol/week: 12.0 standard drinks of alcohol    Types: 12 Standard drinks or equivalent per week    Comment: occasional   Drug use: No   Sexual activity: Not on file  Other Topics Concern   Not on file  Social History Narrative   Not on file   Social Determinants of Health   Financial Resource Strain: Not on file  Food Insecurity: Not on file  Transportation Needs: Not on file  Physical Activity: Not on file  Stress: Not on file  Social Connections: Not on file     Family History: The patient's family history includes Alzheimer's disease in his paternal grandfather.  ROS:   Please see the history of present illness.    All other systems reviewed and are negative.  EKGs/Labs/Other Studies Reviewed:    The following studies were reviewed today: I discussed my findings with the patient at length   Recent Labs: 05/30/2021: Magnesium 2.1 08/26/2021: BUN 18; Creat 1.19; Hemoglobin 15.6; Platelets 323; Potassium 3.3; Sodium 136 09/08/2021: ALT 73  Recent Lipid Panel    Component Value Date/Time   CHOL 186 07/18/2018 1456   TRIG 191 (H) 07/18/2018 1456   HDL 40 07/18/2018 1456   CHOLHDL 4.7 07/18/2018 1456   LDLCALC 108 (H) 07/18/2018 1456     Physical Exam:    VS:  BP (!) 144/86   Pulse 80   Ht 5\' 9"  (1.753 m)   Wt 245 lb 9.6 oz (111.4 kg)   SpO2 96%   BMI 36.27 kg/m     Wt Readings from Last 3 Encounters:  01/13/22 245 lb 9.6 oz (111.4 kg)  09/08/21 255 lb (115.7 kg)  08/26/21 252 lb (114.3 kg)     GEN: Patient is in no acute distress HEENT: Normal NECK: No JVD; No carotid bruits LYMPHATICS: No lymphadenopathy CARDIAC: Hear sounds regular, 2/6 systolic murmur at the apex. RESPIRATORY:  Clear to auscultation without rales, wheezing or rhonchi  ABDOMEN: Soft, non-tender, non-distended MUSCULOSKELETAL:  No edema; No deformity  SKIN: Warm and dry NEUROLOGIC:  Alert and oriented x 3 PSYCHIATRIC:  Normal affect   Signed, Jenean Lindau, MD  01/13/2022 2:25 PM    [facility]

## 2022-01-13 NOTE — Patient Instructions (Signed)
Medication Instructions:  Your physician recommends that you continue on your current medications as directed. Please refer to the Current Medication list given to you today.  *If you need a refill on your cardiac medications before your next appointment, please call your pharmacy*   Lab Work: None ordered If you have labs (blood work) drawn today and your tests are completely normal, you will receive your results only by: MyChart Message (if you have MyChart) OR A paper copy in the mail If you have any lab test that is abnormal or we need to change your treatment, we will call you to review the results.   Testing/Procedures: None ordered   Follow-Up: At [facility], you and your health needs are our priority.  As part of our continuing mission to provide you with exceptional heart care, we have created designated Provider Care Teams.  These Care Teams include your primary Cardiologist (physician) and Advanced Practice Providers (APPs -  Physician Assistants and Nurse Practitioners) who all work together to provide you with the care you need, when you need it.  We recommend signing up for the patient portal called "MyChart".  Sign up information is provided on this After Visit Summary.  MyChart is used to connect with patients for Virtual Visits (Telemedicine).  Patients are able to view lab/test results, encounter notes, upcoming appointments, etc.  Non-urgent messages can be sent to your provider as well.   To learn more about what you can do with MyChart, go to [email or web address].    Your next appointment:   9 month(s)  The format for your next appointment:   In Person  Provider:   Rajan Revankar, MD    Other Instructions none  Important Information About Sugar       

## 2022-02-16 ENCOUNTER — Other Ambulatory Visit: Payer: Self-pay | Admitting: Cardiology

## 2022-02-28 DIAGNOSIS — R079 Chest pain, unspecified: Secondary | ICD-10-CM

## 2022-06-03 ENCOUNTER — Other Ambulatory Visit: Payer: Self-pay | Admitting: Cardiology

## 2022-08-10 DIAGNOSIS — F444 Conversion disorder with motor symptom or deficit: Secondary | ICD-10-CM | POA: Insufficient documentation

## 2022-08-10 HISTORY — DX: Conversion disorder with motor symptom or deficit: F44.4

## 2022-08-12 ENCOUNTER — Other Ambulatory Visit: Payer: Self-pay | Admitting: Cardiology

## 2022-09-25 ENCOUNTER — Other Ambulatory Visit: Payer: Self-pay | Admitting: Cardiology

## 2022-10-11 ENCOUNTER — Other Ambulatory Visit: Payer: Self-pay | Admitting: Cardiology

## 2022-11-16 ENCOUNTER — Telehealth: Payer: Self-pay

## 2022-11-16 ENCOUNTER — Ambulatory Visit: Payer: 59 | Admitting: Cardiology

## 2022-11-16 NOTE — Telephone Encounter (Signed)
   Name: Cameron Bryan  DOB: [birth year]  MRN: [id number]  Primary Cardiologist: Garwin Brothers, MD   Preoperative team, please contact this patient and set up a phone call appointment for further preoperative risk assessment. Please obtain consent and complete medication review. Thank you for your help.Last seen by Dr. Tomie China 01/13/2022  I confirm that guidance regarding antiplatelet and oral anticoagulation therapy has been completed and, if necessary, noted below.  Per office protocol, if patient is without any new symptoms or concerns at the time of their virtual visit, he/she may hold ASA for 7 days and Plavix for 5 days prior to procedure. Please resume ASA and Plavix as soon as possible postprocedure, at the discretion of the surgeon.    I also confirmed the patient resides in the state of [place]. As per [facility] Medical Board telemedicine laws, the patient must reside in the state in which the provider is licensed.   Joni Reining, NP 11/16/2022, 11:27 AM [facility]

## 2022-11-16 NOTE — Telephone Encounter (Signed)
Will forward to pre op APP to confirm ok to defer clearance to appt 12/08/22 with Dr. Tomie China. Procedure is TBD.

## 2022-11-16 NOTE — Telephone Encounter (Signed)
Ok to defer clearance. Please add "Pre-OP" to the appt note.

## 2022-11-16 NOTE — Telephone Encounter (Signed)
   Pre-operative Risk Assessment    Patient Name: Cameron Bryan  DOB: 1970-11-11 MRN: [id number]      Request for Surgical Clearance    Procedure:   EGD and Colonoscopy  Date of Surgery:  Clearance TBD                                 Surgeon:  Dr. Laurell Roof Surgeon's Group or Practice Name:  [facility] Phone number:  [phone] Fax number:  [phone]   Type of Clearance Requested:   - Pharmacy:  Hold Aspirin and Clopidogrel (Plavix) 5 days prior to procedure   Type of Anesthesia:     Additional requests/questions:    Merlene Laughter   11/16/2022, 8:28 AM

## 2022-11-27 ENCOUNTER — Other Ambulatory Visit: Payer: Self-pay | Admitting: Cardiology

## 2022-12-08 ENCOUNTER — Encounter: Payer: Self-pay | Admitting: Cardiology

## 2022-12-08 ENCOUNTER — Ambulatory Visit: Payer: 59 | Attending: Cardiology | Admitting: Cardiology

## 2022-12-08 ENCOUNTER — Telehealth (HOSPITAL_COMMUNITY): Payer: Self-pay | Admitting: *Deleted

## 2022-12-08 VITALS — BP 136/88 | HR 80 | Ht 69.0 in | Wt 243.4 lb

## 2022-12-08 DIAGNOSIS — I2510 Atherosclerotic heart disease of native coronary artery without angina pectoris: Secondary | ICD-10-CM | POA: Diagnosis not present

## 2022-12-08 DIAGNOSIS — Z789 Other specified health status: Secondary | ICD-10-CM

## 2022-12-08 DIAGNOSIS — Z01810 Encounter for preprocedural cardiovascular examination: Secondary | ICD-10-CM

## 2022-12-08 NOTE — Progress Notes (Signed)
Cardiology Office Note:    Date:  12/08/2022   ID:  Cameron Bryan, DOB 1970-11-12, MRN [id number]  PCP:  Marylen Ponto, MD  Cardiologist:  Garwin Brothers, MD   Referring MD: Marylen Ponto, MD    ASSESSMENT:    1. Preop cardiovascular exam   2. Coronary artery disease involving native coronary artery of native heart without angina pectoris   3. Statin intolerance    PLAN:    In order of problems listed above:  Coronary artery disease and preoperative cardiovascular evaluation: Secondary prevention stressed with the patient.  Importance of compliance with diet medication stressed any vocalized understanding.  He leads a sedentary lifestyle poststroke and therefore I would like to do a Lexiscan sestamibi to assess him for appropriateness of procedure such as colonoscopy.  He is agreeable.  The question of dual antiplatelet therapy now comes in.  If there is no ischemia on the stress test then the patient could stop dual antiplatelet therapy.  He can continue only with aspirin for a few days for the procedure.  But he will need clearance from our neurological takes to stop the clopidogrel.  He and his wife are aware of this and they will check with neurology at the appropriate time. Essential hypertension: Blood pressure is stable and diet was emphasized.  Lifestyle modification urged. Mixed dyslipidemia: On lipid-lowering medications followed by primary care.  Lipids were reviewed and he will have fasting blood work when he comes for the stress test. Cigarette smoker: I spent 5 minutes with the patient discussing solely about smoking. Smoking cessation was counseled. I suggested to the patient also different medications and pharmacological interventions. Patient is keen to try stopping on its own at this time. He will get back to me if he needs any further assistance in this matter. Post stroke: Stable and doing therapy with rehab. Patient will be seen in follow-up appointment in 6 months  or earlier if the patient has any concerns.    Medication Adjustments/Labs and Tests Ordered: Current medicines are reviewed at length with the patient today.  Concerns regarding medicines are outlined above.  Orders Placed This Encounter  Procedures   EKG 12-Lead   No orders of the defined types were placed in this encounter.    No chief complaint on file.    History of Present Illness:    Cameron Bryan is a 52 y.o. male.  Patient has past medical history of coronary artery disease, essential hypertension, dyslipidemia, cigarette smoking, COPD.  He recently had a stroke and underwent thrombolysis.  Subsequently has done fine.  No chest pain orthopnea or PND.  He leads a sedentary lifestyle.  He ambulates well.  He is planning to undergo upper and lower endoscopy and for this reason he is here for evaluation.  He is on dual antiplatelet therapy.  At the time of my evaluation, the patient is alert awake oriented and in no distress.  Past Medical History:  Diagnosis Date   Adult-onset obesity 11/17/2021   Alcohol use 09/12/2021   Angina pectoris (HCC) 08/29/2019   Benign essential HTN    Bipolar 1 disorder (HCC)    Body mass index (BMI) 37.0-37.9, adult 11/17/2021   Chronic coronary artery disease 09/26/2019   Chronic dental pain 05/29/2021   Cigarette smoker 07/23/2018   Common migraine    COPD (chronic obstructive pulmonary disease) (HCC)    Coronary artery disease 05/03/2021   Depression    Elevated ferritin 11/17/2021  Elevated LFTs 05/28/2021   Fatigue 11/17/2021   Fever    Functional neurological symptom disorder with weakness or paralysis 08/10/2022   FUO (fever of unknown origin) 05/28/2021   Ganglion cyst of volar aspect of left wrist 05/06/2019   GERD (gastroesophageal reflux disease)    Hypertension    Left knee pain 05/29/2021   Mixed dyslipidemia 12/04/2018   Myalgia 11/17/2021   NASH (nonalcoholic steatohepatitis) 07/01/2021   Obesity (BMI 30-39.9)  10/11/2020   Pain in limb 11/17/2021   Pedal edema 07/23/2018   Posterior cervical adenopathy 05/28/2021   Primary osteoarthritis 11/17/2021   Proteinuria 11/17/2021   Recurrent fever 05/29/2021   Schizoaffective disorder (HCC)    Statin intolerance 10/11/2020   Stroke (HCC)    Tick bites 05/29/2021   Transaminitis 09/12/2021   Trochanteric bursitis of left hip 11/17/2021   Trochanteric bursitis of right hip 11/17/2021    Past Surgical History:  Procedure Laterality Date   CORONARY STENT INTERVENTION N/A 09/16/2019   Procedure: CORONARY STENT INTERVENTION;  Surgeon: Lyn Records, MD;  Location: [facility];  Service: Cardiovascular;  Laterality: N/A;  distal cfx    CYST REMOVAL HAND     LEFT HEART CATH AND CORONARY ANGIOGRAPHY N/A 09/16/2019   Procedure: LEFT HEART CATH AND CORONARY ANGIOGRAPHY;  Surgeon: Lyn Records, MD;  Location: [facility];  Service: Cardiovascular;  Laterality: N/A;    Current Medications: Current Meds  Medication Sig   ALPRAZolam (XANAX) 1 MG tablet Take 1 mg by mouth 3 (three) times daily.   amLODipine (NORVASC) 10 MG tablet Take 10 mg by mouth at bedtime.    aspirin EC 81 MG tablet Take 1 tablet (81 mg total) by mouth daily. Swallow whole.   Bempedoic Acid-Ezetimibe (NEXLIZET) 180-10 MG TABS Take 1 tablet by mouth daily.   clopidogrel (PLAVIX) 75 MG tablet Take 1 tablet (75 mg total) by mouth daily with breakfast.   EDARBYCLOR 40-25 MG TABS Take 1 tablet by mouth daily.   ELDERBERRY PO Take 1 tablet by mouth daily.   EPINEPHrine 0.3 mg/0.3 mL IJ SOAJ injection Inject 0.3 mg into the muscle as needed for anaphylaxis.   furosemide (LASIX) 20 MG tablet Take 20 mg by mouth daily as needed for fluid.   hydrALAZINE (APRESOLINE) 50 MG tablet Take 50 mg by mouth 3 (three) times daily.   meloxicam (MOBIC) 15 MG tablet Take 15 mg by mouth as needed for pain.   metoprolol succinate (TOPROL-XL) 100 MG 24 hr tablet Take 100 mg by mouth daily.    nitroGLYCERIN (NITROSTAT) 0.4 MG SL tablet Place 1 tablet (0.4 mg total) under the tongue every 5 (five) minutes as needed for chest pain. Every 5 minutes   ondansetron (ZOFRAN) 8 MG tablet Take 8 mg by mouth 3 (three) times daily.   pantoprazole (PROTONIX) 40 MG tablet Take 40 mg by mouth at bedtime.    Polyethyl Glycol-Propyl Glycol (SYSTANE OP) Place 1 drop into both eyes daily as needed (dry eyes).   potassium chloride (KLOR-CON) 10 MEQ tablet Take 20 mEq by mouth 2 (two) times daily.   vitamin E 45 MG (100 UNITS) capsule Take 100 Units by mouth daily.     Allergies:   Prednisone, Atorvastatin, Chantix [varenicline tartrate], Oxycodone, Oxycodone hcl, Triamcinolone acetonide, and Valtrex [valacyclovir hcl]   Social History   Socioeconomic History   Marital status: Married    Spouse name: Not on file   Number of children: Not on file  Years of education: Not on file   Highest education level: Not on file  Occupational History   Not on file  Tobacco Use   Smoking status: Every Day    Current packs/day: 0.10    Types: Cigarettes   Smokeless tobacco: Never   Tobacco comments:    States he smokes very irregularly - working on quitting  Substance and Sexual Activity   Alcohol use: Yes    Alcohol/week: 12.0 standard drinks of alcohol    Types: 12 Standard drinks or equivalent per week    Comment: occasional   Drug use: No   Sexual activity: Not on file  Other Topics Concern   Not on file  Social History Narrative   Not on file   Social Determinants of Health   Financial Resource Strain: Not on file  Food Insecurity: Low Risk  (08/07/2022)   Received from [facility]   Hunger Vital Sign    Worried About Running Out of Food in the Last Year: Never true    Ran Out of Food in the Last Year: Never true  Transportation Needs: Not on file (08/07/2022)  Physical Activity: Not on file  Stress: Not on file  Social Connections: Not on file     Family History: The patient's  family history includes Alzheimer's disease in his paternal grandfather.  ROS:   Please see the history of present illness.    All other systems reviewed and are negative.  EKGs/Labs/Other Studies Reviewed:    The following studies were reviewed today: .Marland KitchenEKG Interpretation Date/Time:  Thursday December 08 2022 10:02:14 EST Ventricular Rate:  80 PR Interval:  164 QRS Duration:  96 QT Interval:  374 QTC Calculation: 431 R Axis:   14  Text Interpretation: Normal sinus rhythm T wave abnormality, consider inferior ischemia Abnormal ECG When compared with ECG of 28-May-2021 18:30, PREVIOUS ECG IS PRESENT Confirmed by Belva Crome [phone]) on 12/08/2022 9:13:25 AM     Recent Labs: No results found for requested labs within last 365 days.  Recent Lipid Panel    Component Value Date/Time   CHOL 186 07/18/2018 1456   TRIG 191 (H) 07/18/2018 1456   HDL 40 07/18/2018 1456   CHOLHDL 4.7 07/18/2018 1456   LDLCALC 108 (H) 07/18/2018 1456    Physical Exam:    VS:  BP 136/88   Pulse 80   Ht 5\' 9"  (1.753 m)   Wt 243 lb 6.4 oz (110.4 kg)   SpO2 95%   BMI 35.94 kg/m     Wt Readings from Last 3 Encounters:  12/08/22 243 lb 6.4 oz (110.4 kg)  01/13/22 245 lb 9.6 oz (111.4 kg)  09/08/21 255 lb (115.7 kg)     GEN: Patient is in no acute distress HEENT: Normal NECK: No JVD; No carotid bruits LYMPHATICS: No lymphadenopathy CARDIAC: Hear sounds regular, 2/6 systolic murmur at the apex. RESPIRATORY:  Clear to auscultation without rales, wheezing or rhonchi  ABDOMEN: Soft, non-tender, non-distended MUSCULOSKELETAL:  No edema; No deformity  SKIN: Warm and dry NEUROLOGIC:  Alert and oriented x 3 PSYCHIATRIC:  Normal affect   Signed, Garwin Brothers, MD  12/08/2022 9:27 AM    [facility]

## 2022-12-08 NOTE — Patient Instructions (Addendum)
Medication Instructions:  Your physician recommends that you continue on your current medications as directed. Please refer to the Current Medication list given to you today.  *If you need a refill on your cardiac medications before your next appointment, please call your pharmacy*   Lab Work: Your physician recommends that you return for lab work in: the day of your stress test. You need to have labs done when you are fasting.  You can come Monday through Friday 8:30 am to 12:00 pm and 1:15 to 4:30. You do not need to make an appointment as the order has already been placed.   If you have labs (blood work) drawn today and your tests are completely normal, you will receive your results only by: MyChart Message (if you have MyChart) OR A paper copy in the mail If you have any lab test that is abnormal or we need to change your treatment, we will call you to review the results.   Testing/Procedures: You are scheduled for a Myocardial Perfusion Imaging Study.  Please arrive 15 minutes prior to your appointment time for registration and insurance purposes.  The test will take approximately 3 to 4 hours to complete; you may bring reading material.  If someone comes with you to your appointment, they will need to remain in the main lobby due to limited space in the testing area.   How to prepare for your Myocardial Perfusion Test: Do not eat or drink anything after midnight prior to your test, except you may have water. Do not consume products containing caffeine (regular or decaffeinated) 12 hours prior to your test. (ex: coffee, chocolate, sodas, tea). Do bring a list of your current medications with you.  If not listed below, you may take your medications as normal. Do wear comfortable clothes (no dresses or overalls) and walking shoes, tennis shoes preferred (No heels or open toe shoes are allowed). Do NOT wear cologne, perfume, aftershave, or lotions (deodorant is allowed). If these  instructions are not followed, your test will have to be rescheduled.  If you cannot keep your appointment, please provide 24 hours notification to the Nuclear Lab, to avoid a possible $50 charge to your account.  Follow-Up: At [facility], you and your health needs are our priority.  As part of our continuing mission to provide you with exceptional heart care, we have created designated Provider Care Teams.  These Care Teams include your primary Cardiologist (physician) and Advanced Practice Providers (APPs -  Physician Assistants and Nurse Practitioners) who all work together to provide you with the care you need, when you need it.  We recommend signing up for the patient portal called "MyChart".  Sign up information is provided on this After Visit Summary.  MyChart is used to connect with patients for Virtual Visits (Telemedicine).  Patients are able to view lab/test results, encounter notes, upcoming appointments, etc.  Non-urgent messages can be sent to your provider as well.   To learn more about what you can do with MyChart, go to [email or web address].    Your next appointment:   9 month(s)  Provider:   Belva Crome, MD   Other Instructions  Cardiac Nuclear Scan A cardiac nuclear scan is a test that is done to check the flow of blood to your heart. It is done when you are resting and when you are exercising. The test looks for problems such as: Not enough blood reaching a portion of the heart. The heart muscle not working as it  should. You may need this test if you have: Heart disease. Lab results that are not normal. Had heart surgery or a balloon procedure to open up blocked arteries (angioplasty) or a small mesh tube (stent). Chest pain. Shortness of breath. Had a heart attack. In this test, a special dye (tracer) is put into your bloodstream. The tracer will travel to your heart. A camera will then take pictures of your heart to see how the tracer moves through  your heart. This test is usually done at a hospital and takes 2-4 hours. Tell a doctor about: Any allergies you have. All medicines you are taking, including vitamins, herbs, eye drops, creams, and over-the-counter medicines. Any bleeding problems you have. Any surgeries you have had. Any medical conditions you have. Whether you are pregnant or may be pregnant. Any history of asthma or long-term (chronic) lung disease. Any history of heart rhythm disorders or heart valve conditions. What are the risks? Your doctor will talk with you about risks. These may include: Serious chest pain and heart attack. This is only a risk if the stress portion of the test is done. Fast or uneven heartbeats (palpitations). A feeling of warmth in your chest. This feeling usually does not last long. Allergic reaction to the tracer. Shortness of breath or trouble breathing. What happens before the test? Ask your doctor about changing or stopping your normal medicines. Follow instructions from your doctor about what you cannot eat or drink. Remove your jewelry on the day of the test. Ask your doctor if you need to avoid nicotine or caffeine. What happens during the test? An IV tube will be inserted into one of your veins. Your doctor will give you a small amount of tracer through the IV tube. You will wait for 20-40 minutes while the tracer moves through your bloodstream. Your heart will be monitored with an electrocardiogram (ECG). You will lie down on an exam table. Pictures of your heart will be taken for about 15-20 minutes. You may also have a stress test. For this test, one of these things may be done: You will be asked to exercise on a treadmill or a stationary bike. You will be given medicines that will make your heart work harder. This is done if you are unable to exercise. When blood flow to your heart has peaked, a tracer will again be given through the IV tube. After 20-40 minutes, you will get  back on the exam table. More pictures will be taken of your heart. Depending on the tracer that is used, more pictures may need to be taken 3-4 hours later. Your IV tube will be removed when the test is over. The test may vary among doctors and hospitals. What happens after the test? Ask your doctor: Whether you can return to your normal schedule, including diet, activities, travel, and medicines. Whether you should drink more fluids. This will help to remove the tracer from your body. Ask your doctor, or the department that is doing the test: When will my results be ready? How will I get my results? What are my treatment options? What other tests do I need? What are my next steps? This information is not intended to replace advice given to you by your health care provider. Make sure you discuss any questions you have with your health care provider. Document Revised: 05/18/2021 Document Reviewed: 05/18/2021 Elsevier Patient Education  2023 [employer].

## 2022-12-13 ENCOUNTER — Ambulatory Visit: Payer: 59 | Attending: Cardiology

## 2022-12-13 ENCOUNTER — Encounter: Payer: Self-pay | Admitting: Family Medicine

## 2022-12-13 ENCOUNTER — Telehealth: Payer: Self-pay | Admitting: Cardiology

## 2022-12-13 DIAGNOSIS — Z01810 Encounter for preprocedural cardiovascular examination: Secondary | ICD-10-CM | POA: Diagnosis not present

## 2022-12-13 DIAGNOSIS — I2510 Atherosclerotic heart disease of native coronary artery without angina pectoris: Secondary | ICD-10-CM

## 2022-12-13 MED ORDER — TECHNETIUM TC 99M TETROFOSMIN IV KIT
30.7000 | PACK | Freq: Once | INTRAVENOUS | Status: AC | PRN
  Administered 2022-12-13: 30.7 via INTRAVENOUS

## 2022-12-13 MED ORDER — TECHNETIUM TC 99M TETROFOSMIN IV KIT
10.9000 | PACK | Freq: Once | INTRAVENOUS | Status: AC | PRN
  Administered 2022-12-13: 10.9 via INTRAVENOUS

## 2022-12-13 MED ORDER — REGADENOSON 0.4 MG/5ML IV SOLN
0.4000 mg | Freq: Once | INTRAVENOUS | Status: AC
Start: 2022-12-13 — End: 2022-12-13
  Administered 2022-12-13: 0.4 mg via INTRAVENOUS

## 2022-12-13 NOTE — Telephone Encounter (Signed)
Left detailed message that lipids are needed as this was done in 10/2022 per Dr. Tomie China.

## 2022-12-13 NOTE — Telephone Encounter (Signed)
Patient dropping off labs from PCP- scanned into chart. Are these sufficient or does Dr. Tomie China wish for him to have more?  Labs collected are from 10/10/22  Please call patient at [phone]

## 2022-12-15 LAB — MYOCARDIAL PERFUSION IMAGING
LV dias vol: 130 mL (ref 62–150)
LV sys vol: 43 mL
Nuc Stress EF: 67 %
Peak HR: 99 {beats}/min
Rest HR: 80 {beats}/min
Rest Nuclear Isotope Dose: 10.9 mCi
SDS: 1
SRS: 0
SSS: 1
Stress Nuclear Isotope Dose: 30.7 mCi
TID: 0.98

## 2022-12-19 ENCOUNTER — Telehealth: Payer: Self-pay

## 2022-12-19 NOTE — Telephone Encounter (Signed)
Left message for patient to call back.  Normal stress test

## 2022-12-19 NOTE — Telephone Encounter (Signed)
-----   Message from Aundra Dubin Revankar sent at 12/15/2022  6:51 PM EST ----- The results of the study is unremarkable. Please inform patient. I will discuss in detail at next appointment. Cc  primary care/referring physician Garwin Brothers, MD 12/15/2022 6:51 PM

## 2022-12-20 NOTE — Telephone Encounter (Signed)
Results reviewed with pt as per Dr. Revankar's note.  Pt verbalized understanding and had no additional questions. Routed to PCP.  

## 2022-12-20 NOTE — Telephone Encounter (Signed)
Patient's wife Darl Pikes) returned RN's call regarding patient's results.

## 2023-01-01 ENCOUNTER — Other Ambulatory Visit: Payer: Self-pay | Admitting: Cardiology

## 2023-01-03 ENCOUNTER — Other Ambulatory Visit: Payer: Self-pay | Admitting: Cardiology

## 2023-01-12 NOTE — Telephone Encounter (Signed)
 [facility], [name], is calling to get update on clearance and hold. Please advise.

## 2023-01-16 NOTE — Telephone Encounter (Signed)
   Patient Name: Cameron Bryan  DOB: 01-22-1970 MRN: [id number]  Primary Cardiologist: Jennifer JONELLE Crape, MD  Chart reviewed as part of pre-operative protocol coverage. Given past medical history and time since last visit, based on ACC/AHA guidelines, Cameron Bryan is at acceptable risk for the planned procedure without further cardiovascular testing.  Per Dr. Margaretann:   I reviewed his records.  As long as he has not had a stent or any intervention in the past 1 year he is fine to hold the Plavix .  Best to keep him on a baby aspirin  so that there is some protection.    The patient was advised that if he develops new symptoms prior to surgery to contact our office to arrange for a follow-up visit, and he verbalized understanding.  I will route this recommendation to the requesting party via Epic fax function and remove from pre-op pool.  Please call with questions.  Lamarr Satterfield, NP 01/16/2023, 8:06 AM

## 2023-04-14 LAB — LAB REPORT - SCANNED
A1c: 5.6
EGFR: 60

## 2023-06-19 DIAGNOSIS — Z8711 Personal history of peptic ulcer disease: Secondary | ICD-10-CM | POA: Diagnosis not present

## 2023-06-19 DIAGNOSIS — R410 Disorientation, unspecified: Secondary | ICD-10-CM | POA: Diagnosis not present

## 2023-06-19 DIAGNOSIS — M1990 Unspecified osteoarthritis, unspecified site: Secondary | ICD-10-CM | POA: Diagnosis not present

## 2023-06-19 DIAGNOSIS — Z7982 Long term (current) use of aspirin: Secondary | ICD-10-CM | POA: Diagnosis not present

## 2023-06-19 DIAGNOSIS — R739 Hyperglycemia, unspecified: Secondary | ICD-10-CM | POA: Diagnosis not present

## 2023-06-19 DIAGNOSIS — G43909 Migraine, unspecified, not intractable, without status migrainosus: Secondary | ICD-10-CM | POA: Diagnosis not present

## 2023-06-19 DIAGNOSIS — E860 Dehydration: Secondary | ICD-10-CM | POA: Diagnosis not present

## 2023-06-19 DIAGNOSIS — Z955 Presence of coronary angioplasty implant and graft: Secondary | ICD-10-CM | POA: Diagnosis not present

## 2023-06-19 DIAGNOSIS — F17210 Nicotine dependence, cigarettes, uncomplicated: Secondary | ICD-10-CM | POA: Diagnosis not present

## 2023-06-19 DIAGNOSIS — R0789 Other chest pain: Secondary | ICD-10-CM | POA: Diagnosis not present

## 2023-06-19 DIAGNOSIS — Z79899 Other long term (current) drug therapy: Secondary | ICD-10-CM | POA: Diagnosis not present

## 2023-06-19 DIAGNOSIS — E871 Hypo-osmolality and hyponatremia: Secondary | ICD-10-CM | POA: Diagnosis not present

## 2023-06-19 DIAGNOSIS — J449 Chronic obstructive pulmonary disease, unspecified: Secondary | ICD-10-CM | POA: Diagnosis not present

## 2023-06-19 DIAGNOSIS — R531 Weakness: Secondary | ICD-10-CM | POA: Diagnosis not present

## 2023-06-19 DIAGNOSIS — Z8673 Personal history of transient ischemic attack (TIA), and cerebral infarction without residual deficits: Secondary | ICD-10-CM | POA: Diagnosis not present

## 2023-06-19 DIAGNOSIS — T501X5A Adverse effect of loop [high-ceiling] diuretics, initial encounter: Secondary | ICD-10-CM | POA: Diagnosis not present

## 2023-06-19 DIAGNOSIS — I2510 Atherosclerotic heart disease of native coronary artery without angina pectoris: Secondary | ICD-10-CM | POA: Diagnosis not present

## 2023-06-19 DIAGNOSIS — R079 Chest pain, unspecified: Secondary | ICD-10-CM | POA: Diagnosis not present

## 2023-06-19 DIAGNOSIS — K219 Gastro-esophageal reflux disease without esophagitis: Secondary | ICD-10-CM | POA: Diagnosis not present

## 2023-06-19 DIAGNOSIS — R9431 Abnormal electrocardiogram [ECG] [EKG]: Secondary | ICD-10-CM | POA: Diagnosis not present

## 2023-06-19 DIAGNOSIS — T502X5A Adverse effect of carbonic-anhydrase inhibitors, benzothiadiazides and other diuretics, initial encounter: Secondary | ICD-10-CM | POA: Diagnosis not present

## 2023-06-19 DIAGNOSIS — E785 Hyperlipidemia, unspecified: Secondary | ICD-10-CM | POA: Diagnosis not present

## 2023-06-19 DIAGNOSIS — R109 Unspecified abdominal pain: Secondary | ICD-10-CM | POA: Diagnosis not present

## 2023-06-19 DIAGNOSIS — I10 Essential (primary) hypertension: Secondary | ICD-10-CM | POA: Diagnosis not present

## 2023-06-19 DIAGNOSIS — E876 Hypokalemia: Secondary | ICD-10-CM | POA: Diagnosis not present

## 2023-06-19 DIAGNOSIS — Z7902 Long term (current) use of antithrombotics/antiplatelets: Secondary | ICD-10-CM | POA: Diagnosis not present

## 2023-06-19 DIAGNOSIS — E861 Hypovolemia: Secondary | ICD-10-CM | POA: Diagnosis not present

## 2023-06-22 ENCOUNTER — Other Ambulatory Visit: Payer: Self-pay | Admitting: Cardiology

## 2023-06-26 DIAGNOSIS — E876 Hypokalemia: Secondary | ICD-10-CM | POA: Diagnosis not present

## 2023-08-04 DIAGNOSIS — R21 Rash and other nonspecific skin eruption: Secondary | ICD-10-CM | POA: Diagnosis not present

## 2023-09-06 ENCOUNTER — Encounter: Payer: Self-pay | Admitting: Cardiology

## 2023-09-06 ENCOUNTER — Ambulatory Visit: Attending: Cardiology | Admitting: Cardiology

## 2023-09-06 VITALS — BP 138/90 | HR 94 | Ht 69.0 in | Wt 234.4 lb

## 2023-09-06 DIAGNOSIS — E669 Obesity, unspecified: Secondary | ICD-10-CM

## 2023-09-06 DIAGNOSIS — I2510 Atherosclerotic heart disease of native coronary artery without angina pectoris: Secondary | ICD-10-CM | POA: Diagnosis not present

## 2023-09-06 DIAGNOSIS — F17210 Nicotine dependence, cigarettes, uncomplicated: Secondary | ICD-10-CM

## 2023-09-06 DIAGNOSIS — F1010 Alcohol abuse, uncomplicated: Secondary | ICD-10-CM | POA: Insufficient documentation

## 2023-09-06 DIAGNOSIS — E782 Mixed hyperlipidemia: Secondary | ICD-10-CM

## 2023-09-06 DIAGNOSIS — I10 Essential (primary) hypertension: Secondary | ICD-10-CM

## 2023-09-06 NOTE — Patient Instructions (Addendum)
Medication Instructions:  Your physician has recommended you make the following change in your medication:   Stop aspirin.  *If you need a refill on your cardiac medications before your next appointment, please call your pharmacy*   Lab Work: None ordered If you have labs (blood work) drawn today and your tests are completely normal, you will receive your results only by: MyChart Message (if you have MyChart) OR A paper copy in the mail If you have any lab test that is abnormal or we need to change your treatment, we will call you to review the results.   Testing/Procedures: None ordered   Follow-Up: At [facility], you and your health needs are our priority.  As part of our continuing mission to provide you with exceptional heart care, we have created designated Provider Care Teams.  These Care Teams include your primary Cardiologist (physician) and Advanced Practice Providers (APPs -  Physician Assistants and Nurse Practitioners) who all work together to provide you with the care you need, when you need it.  We recommend signing up for the patient portal called "MyChart".  Sign up information is provided on this After Visit Summary.  MyChart is used to connect with patients for Virtual Visits (Telemedicine).  Patients are able to view lab/test results, encounter notes, upcoming appointments, etc.  Non-urgent messages can be sent to your provider as well.   To learn more about what you can do with MyChart, go to [email or web address].    Your next appointment:   9 month(s)  The format for your next appointment:   In Person  Provider:   Rajan Revankar, MD    Other Instructions none  Important Information About Sugar       

## 2023-09-06 NOTE — Progress Notes (Signed)
 Cardiology Office Note:    Date:  09/06/2023   ID:  Cameron Bryan Polio, DOB 03/01/1970, MRN [id number]  PCP:  Cameron Marcellus GORMAN, MD  Cardiologist:  Jennifer JONELLE Crape, MD   Referring MD: Cameron Marcellus GORMAN, MD    ASSESSMENT:    1. Coronary artery disease involving native coronary artery of native heart without angina pectoris   2. Primary hypertension   3. Mixed dyslipidemia   4. Obesity (BMI 30-39.9)   5. Cigarette smoker   6. Alcohol abuse    PLAN:    In order of problems listed above:  Coronary artery disease: Secondary prevention stressed with the patient.  Importance of compliance with diet medication stressed and patient verbalized standing.  He was advised to walk at least half an hour a day on a daily basis.  In view of the length of time of the intervention he was told to discontinue aspirin  and only remain on Plavix  as antiplatelet agent.  He agrees Essential hypertension: Blood pressure stable and diet was emphasized.  He is rechecked blood pressure was 130/80 and he mentions to me that his blood pressure is a little elevated because of pain because of his issues with arthritis and gout.  Diet, salt intake issues and lifestyle modification urged. Mixed dyslipidemia: On lipid-lowering medications followed by primary care.  Goal LDL less than 60. Cigarette smoker: I spent 5 minutes with the patient discussing solely about smoking. Smoking cessation was counseled. I suggested to the patient also different medications and pharmacological interventions. Patient is keen to try stopping on its own at this time. He will get back to me if he needs any further assistance in this matter. History of alcohol abuse: I cautioned him about this and he promises to do better. Patient will be seen in follow-up appointment in 6 months or earlier if the patient has any concerns.    Medication Adjustments/Labs and Tests Ordered: Current medicines are reviewed at length with the patient today.  Concerns  regarding medicines are outlined above.  No orders of the defined types were placed in this encounter.  No orders of the defined types were placed in this encounter.    No chief complaint on file.    History of Present Illness:    Cameron Bryan is a 53 y.o. male.  Patient has past medical history of coronary artery disease, mixed dyslipidemia,, history of smoking and alcohol abuse.  He denies any problems at this time and takes care of activities of daily living.  He leads a sedentary lifestyle.  At the time of my evaluation, the patient is alert awake oriented and in no distress.  Past Medical History:  Diagnosis Date   Adult-onset obesity 11/17/2021   Alcohol use 09/12/2021   Angina pectoris (HCC) 08/29/2019   Benign essential HTN    Bipolar 1 disorder (HCC)    Body mass index (BMI) 37.0-37.9, adult 11/17/2021   Chronic coronary artery disease 09/26/2019   Chronic dental pain 05/29/2021   Cigarette smoker 07/23/2018   Common migraine    COPD (chronic obstructive pulmonary disease) (HCC)    Coronary artery disease 05/03/2021   Depression    Elevated ferritin 11/17/2021   Elevated LFTs 05/28/2021   Fatigue 11/17/2021   Fever    Functional neurological symptom disorder with weakness or paralysis 08/10/2022   FUO (fever of unknown origin) 05/28/2021   Ganglion cyst of volar aspect of left wrist 05/06/2019   GERD (gastroesophageal reflux disease)    Hypertension  Left knee pain 05/29/2021   Mixed dyslipidemia 12/04/2018   Myalgia 11/17/2021   NASH (nonalcoholic steatohepatitis) 07/01/2021   Obesity (BMI 30-39.9) 10/11/2020   Pain in limb 11/17/2021   Pedal edema 07/23/2018   Posterior cervical adenopathy 05/28/2021   Primary osteoarthritis 11/17/2021   Proteinuria 11/17/2021   Recurrent fever 05/29/2021   Schizoaffective disorder (HCC)    Statin intolerance 10/11/2020   Stroke (HCC)    Tick bites 05/29/2021   Transaminitis 09/12/2021   Trochanteric bursitis of left  hip 11/17/2021   Trochanteric bursitis of right hip 11/17/2021    Past Surgical History:  Procedure Laterality Date   CORONARY STENT INTERVENTION N/A 09/16/2019   Procedure: CORONARY STENT INTERVENTION;  Surgeon: Claudene Victory ORN, MD;  Location: [facility] INVASIVE CV LAB;  Service: Cardiovascular;  Laterality: N/A;  distal cfx    CYST REMOVAL HAND     LEFT HEART CATH AND CORONARY ANGIOGRAPHY N/A 09/16/2019   Procedure: LEFT HEART CATH AND CORONARY ANGIOGRAPHY;  Surgeon: Claudene Victory ORN, MD;  Location: [facility] INVASIVE CV LAB;  Service: Cardiovascular;  Laterality: N/A;    Current Medications: Current Meds  Medication Sig   ALPRAZolam  (XANAX ) 1 MG tablet Take 1 mg by mouth 3 (three) times daily.   amLODipine  (NORVASC ) 10 MG tablet Take 10 mg by mouth at bedtime.    Bempedoic Acid-Ezetimibe  (NEXLIZET ) 180-10 MG TABS Take 1 tablet by mouth daily.   clopidogrel  (PLAVIX ) 75 MG tablet TAKE 1 TABLET BY MOUTH DAILY  WITH BREAKFAST   EDARBYCLOR 40-25 MG TABS Take 1 tablet by mouth daily.   ELDERBERRY PO Take 1 tablet by mouth daily.   EPINEPHrine  0.3 mg/0.3 mL IJ SOAJ injection Inject 0.3 mg into the muscle as needed for anaphylaxis.   ezetimibe  (ZETIA ) 10 MG tablet Take 10 mg by mouth every evening.   furosemide  (LASIX ) 20 MG tablet Take 20 mg by mouth daily as needed for fluid.   hydrALAZINE  (APRESOLINE ) 50 MG tablet Take 50 mg by mouth 3 (three) times daily.   meloxicam (MOBIC) 15 MG tablet Take 15 mg by mouth as needed for pain.   metoprolol  succinate (TOPROL -XL) 100 MG 24 hr tablet Take 100 mg by mouth daily.   nitroGLYCERIN  (NITROSTAT ) 0.4 MG SL tablet Place 1 tablet (0.4 mg total) under the tongue every 5 (five) minutes as needed for chest pain. Every 5 minutes   ondansetron  (ZOFRAN ) 8 MG tablet Take 8 mg by mouth 3 (three) times daily.   pantoprazole  (PROTONIX ) 40 MG tablet Take 40 mg by mouth at bedtime.    Polyethyl Glycol-Propyl Glycol (SYSTANE OP) Place 1 drop into both eyes daily as needed (dry  eyes).   potassium chloride  (KLOR-CON ) 10 MEQ tablet Take 20 mEq by mouth 2 (two) times daily.   vitamin E 45 MG (100 UNITS) capsule Take 100 Units by mouth daily.   VRAYLAR 1.5 MG capsule Take 1.5 mg by mouth at bedtime.   [DISCONTINUED] aspirin  EC 81 MG tablet Take 1 tablet (81 mg total) by mouth daily. Swallow whole.     Allergies:   Prednisone , Atorvastatin, Chantix [varenicline tartrate], Oxycodone , Oxycodone  hcl, Triamcinolone  acetonide, and Valtrex [valacyclovir hcl]   Social History   Socioeconomic History   Marital status: Married    Spouse name: Not on file   Number of children: Not on file   Years of education: Not on file   Highest education level: Not on file  Occupational History   Not on file  Tobacco Use  Smoking status: Every Day    Current packs/day: 0.10    Types: Cigarettes   Smokeless tobacco: Never   Tobacco comments:    States he smokes very irregularly - working on quitting  Substance and Sexual Activity   Alcohol use: Yes    Alcohol/week: 12.0 standard drinks of alcohol    Types: 12 Standard drinks or equivalent per week    Comment: occasional   Drug use: No   Sexual activity: Not on file  Other Topics Concern   Not on file  Social History Narrative   Not on file   Social Drivers of Health   Financial Resource Strain: Not on file  Food Insecurity: Low Risk  (08/07/2022)   Received from [facility]   Hunger Vital Sign    Within the past 12 months, you worried that your food would run out before you got money to buy more: Never true    Within the past 12 months, the food you bought just didn't last and you didn't have money to get more. : Never true  Transportation Needs: Not on file (08/07/2022)  Physical Activity: Not on file  Stress: Not on file  Social Connections: Not on file     Family History: The patient's family history includes Alzheimer's disease in his paternal grandfather.  ROS:   Please see the history of present illness.     All other systems reviewed and are negative.  EKGs/Labs/Other Studies Reviewed:    The following studies were reviewed today: .SABRA   I discussed my findings with the patient at length   Recent Labs: No results found for requested labs within last 365 days.  Recent Lipid Panel    Component Value Date/Time   CHOL 186 07/18/2018 1456   TRIG 191 (H) 07/18/2018 1456   HDL 40 07/18/2018 1456   CHOLHDL 4.7 07/18/2018 1456   LDLCALC 108 (H) 07/18/2018 1456    Physical Exam:    VS:  BP (!) 144/90   Pulse 94   Ht 5' 9 (1.753 m)   Wt 234 lb 6.4 oz (106.3 kg)   SpO2 96%   BMI 34.61 kg/m     Wt Readings from Last 3 Encounters:  09/06/23 234 lb 6.4 oz (106.3 kg)  12/13/22 243 lb (110.2 kg)  12/08/22 243 lb 6.4 oz (110.4 kg)     GEN: Patient is in no acute distress HEENT: Normal NECK: No JVD; No carotid bruits LYMPHATICS: No lymphadenopathy CARDIAC: Hear sounds regular, 2/6 systolic murmur at the apex. RESPIRATORY:  Clear to auscultation without rales, wheezing or rhonchi  ABDOMEN: Soft, non-tender, non-distended MUSCULOSKELETAL:  No edema; No deformity  SKIN: Warm and dry NEUROLOGIC:  Alert and oriented x 3 PSYCHIATRIC:  Normal affect   Signed, Jennifer JONELLE Crape, MD  09/06/2023 11:08 AM    [facility]

## 2023-10-04 DIAGNOSIS — I491 Atrial premature depolarization: Secondary | ICD-10-CM | POA: Diagnosis not present

## 2023-10-04 DIAGNOSIS — R9431 Abnormal electrocardiogram [ECG] [EKG]: Secondary | ICD-10-CM | POA: Diagnosis not present

## 2023-10-04 DIAGNOSIS — F17210 Nicotine dependence, cigarettes, uncomplicated: Secondary | ICD-10-CM | POA: Diagnosis not present

## 2023-10-04 DIAGNOSIS — R509 Fever, unspecified: Secondary | ICD-10-CM | POA: Diagnosis not present

## 2023-10-04 DIAGNOSIS — J440 Chronic obstructive pulmonary disease with acute lower respiratory infection: Secondary | ICD-10-CM | POA: Diagnosis not present

## 2023-10-04 DIAGNOSIS — R0602 Shortness of breath: Secondary | ICD-10-CM | POA: Diagnosis not present

## 2023-10-04 DIAGNOSIS — R059 Cough, unspecified: Secondary | ICD-10-CM | POA: Diagnosis not present

## 2023-10-17 DIAGNOSIS — M109 Gout, unspecified: Secondary | ICD-10-CM | POA: Diagnosis not present

## 2023-10-17 DIAGNOSIS — I10 Essential (primary) hypertension: Secondary | ICD-10-CM | POA: Diagnosis not present

## 2023-10-17 DIAGNOSIS — R7309 Other abnormal glucose: Secondary | ICD-10-CM | POA: Diagnosis not present

## 2023-10-17 DIAGNOSIS — G720 Drug-induced myopathy: Secondary | ICD-10-CM | POA: Diagnosis not present

## 2023-10-17 LAB — LAB REPORT - SCANNED: A1c: 5.5

## 2023-11-02 DIAGNOSIS — E876 Hypokalemia: Secondary | ICD-10-CM | POA: Diagnosis not present

## 2023-11-14 NOTE — Progress Notes (Signed)
 CRUZ BONG                                          MRN: [id number]   11/14/2023   The VBCI Quality Team Specialist reviewed this patient medical record for the purposes of chart review for care gap closure. The following were reviewed: chart review for care gap closure-kidney health evaluation for diabetes:eGFR  and uACR.    VBCI Quality Team

## 2023-12-24 ENCOUNTER — Other Ambulatory Visit: Payer: Self-pay | Admitting: Cardiology

## 2024-01-14 DIAGNOSIS — R9431 Abnormal electrocardiogram [ECG] [EKG]: Secondary | ICD-10-CM | POA: Diagnosis not present

## 2024-01-14 DIAGNOSIS — I517 Cardiomegaly: Secondary | ICD-10-CM | POA: Diagnosis not present

## 2024-01-14 DIAGNOSIS — I340 Nonrheumatic mitral (valve) insufficiency: Secondary | ICD-10-CM | POA: Diagnosis not present

## 2024-01-17 ENCOUNTER — Telehealth: Payer: Self-pay

## 2024-01-17 NOTE — Transitions of Care (Post Inpatient/ED Visit) (Signed)
" ° °  01/17/2024  Name: Cameron Bryan MRN: [id number] DOB: 1970/10/25  Today's TOC FU Call Status: Today's TOC FU Call Status:: Successful TOC FU Call Completed TOC FU Call Complete Date: 01/17/24 (Call back to patient. explained TOC program and purpose of call-Patient declines stating they went over all that yesterday and I have scheduled a an appointment with my doctor)  Patient's Name and Date of Birth confirmed. Name, DOB  Shona Prow RN, CCM   [facility]-Population Health RN Care Manager [phone]  "

## 2024-01-17 NOTE — Transitions of Care (Post Inpatient/ED Visit) (Signed)
" ° °  01/17/2024  Name: Cameron Bryan MRN: [id number] DOB: [birth year]  Today's TOC FU Call Status: Today's TOC FU Call Status:: Unsuccessful Call (1st Attempt) Unsuccessful Call (1st Attempt) Date: 01/17/24  Attempted to reach the patient regarding the most recent Inpatient/ED visit.  Follow Up Plan: Additional outreach attempts will be made to reach the patient to complete the Transitions of Care (Post Inpatient/ED visit) call.   Shona Prow RN, CCM [place]  [facility]Population Health RN Care Manager [phone]  "
